# Patient Record
Sex: Female | Born: 1935 | Race: White | Hispanic: No | Marital: Married | Smoking: Never smoker
Health system: Southern US, Community
[De-identification: ages and names within clinical notes are randomized; demographics above are authoritative.]

## PROBLEM LIST (undated history)

## (undated) DIAGNOSIS — E785 Hyperlipidemia, unspecified: Secondary | ICD-10-CM

## (undated) DIAGNOSIS — I739 Peripheral vascular disease, unspecified: Secondary | ICD-10-CM

## (undated) DIAGNOSIS — J302 Other seasonal allergic rhinitis: Secondary | ICD-10-CM

## (undated) DIAGNOSIS — M1990 Unspecified osteoarthritis, unspecified site: Secondary | ICD-10-CM

## (undated) HISTORY — PX: OTHER SURGICAL HISTORY: SHX169

## (undated) HISTORY — DX: Hyperlipidemia, unspecified: E78.5

## (undated) HISTORY — PX: DOPPLER ECHOCARDIOGRAPHY: SHX263

## (undated) HISTORY — PX: BREAST SURGERY: SHX581

---

## 1998-07-15 ENCOUNTER — Ambulatory Visit (HOSPITAL_COMMUNITY): Admission: RE | Admit: 1998-07-15 | Discharge: 1998-07-15 | Payer: Self-pay | Admitting: Internal Medicine

## 2000-06-07 ENCOUNTER — Encounter: Payer: Self-pay | Admitting: Internal Medicine

## 2000-06-07 ENCOUNTER — Ambulatory Visit (HOSPITAL_COMMUNITY): Admission: RE | Admit: 2000-06-07 | Discharge: 2000-06-07 | Payer: Self-pay | Admitting: Internal Medicine

## 2002-05-29 ENCOUNTER — Encounter: Payer: Self-pay | Admitting: Internal Medicine

## 2002-05-29 ENCOUNTER — Ambulatory Visit (HOSPITAL_COMMUNITY): Admission: RE | Admit: 2002-05-29 | Discharge: 2002-05-29 | Payer: Self-pay | Admitting: Internal Medicine

## 2003-08-09 ENCOUNTER — Encounter: Payer: Self-pay | Admitting: Internal Medicine

## 2003-08-19 ENCOUNTER — Encounter: Payer: Self-pay | Admitting: Internal Medicine

## 2003-08-19 ENCOUNTER — Ambulatory Visit (HOSPITAL_COMMUNITY): Admission: RE | Admit: 2003-08-19 | Discharge: 2003-08-19 | Payer: Self-pay | Admitting: Internal Medicine

## 2005-04-29 ENCOUNTER — Ambulatory Visit: Payer: Self-pay | Admitting: Internal Medicine

## 2005-07-28 ENCOUNTER — Ambulatory Visit: Payer: Self-pay | Admitting: Internal Medicine

## 2005-07-29 ENCOUNTER — Ambulatory Visit: Payer: Self-pay | Admitting: Internal Medicine

## 2005-08-04 ENCOUNTER — Ambulatory Visit: Payer: Self-pay | Admitting: Internal Medicine

## 2007-02-21 ENCOUNTER — Ambulatory Visit: Payer: Self-pay | Admitting: Internal Medicine

## 2007-02-21 LAB — CONVERTED CEMR LAB
ALT: 20 units/L (ref 0–40)
AST: 25 units/L (ref 0–37)
Albumin: 3.8 g/dL (ref 3.5–5.2)
Alkaline Phosphatase: 76 units/L (ref 39–117)
BUN: 13 mg/dL (ref 6–23)
Basophils Absolute: 0.1 10*3/uL (ref 0.0–0.1)
Basophils Relative: 1 % (ref 0.0–1.0)
Bilirubin, Direct: 0.1 mg/dL (ref 0.0–0.3)
CO2: 31 meq/L (ref 19–32)
Calcium: 9.3 mg/dL (ref 8.4–10.5)
Chloride: 105 meq/L (ref 96–112)
Cholesterol: 239 mg/dL (ref 0–200)
Creatinine, Ser: 0.7 mg/dL (ref 0.4–1.2)
Direct LDL: 157.7 mg/dL
Eosinophils Absolute: 0.2 10*3/uL (ref 0.0–0.6)
Eosinophils Relative: 3.4 % (ref 0.0–5.0)
GFR calc Af Amer: 106 mL/min
GFR calc non Af Amer: 87 mL/min
Glucose, Bld: 101 mg/dL — ABNORMAL HIGH (ref 70–99)
HCT: 43.4 % (ref 36.0–46.0)
HDL: 56.1 mg/dL (ref >39.0)
Hemoglobin: 14.9 g/dL (ref 12.0–15.0)
Lymphocytes Relative: 26.4 % (ref 12.0–46.0)
MCHC: 34.3 g/dL (ref 30.0–36.0)
MCV: 90.9 fL (ref 78.0–100.0)
Monocytes Absolute: 0.5 10*3/uL (ref 0.2–0.7)
Monocytes Relative: 8.7 % (ref 3.0–11.0)
Neutro Abs: 3.3 10*3/uL (ref 1.4–7.7)
Neutrophils Relative %: 60.5 % (ref 43.0–77.0)
Platelets: 227 10*3/uL (ref 150–400)
Potassium: 4.8 meq/L (ref 3.5–5.1)
RBC: 4.78 M/uL (ref 3.87–5.11)
RDW: 11.6 % (ref 11.5–14.6)
Sodium: 143 meq/L (ref 135–145)
TSH: 1.99 microintl units/mL (ref 0.35–5.50)
Total Bilirubin: 0.7 mg/dL (ref 0.3–1.2)
Total CHOL/HDL Ratio: 4.3
Total Protein: 6.5 g/dL (ref 6.0–8.3)
Triglycerides: 166 mg/dL — ABNORMAL HIGH (ref 0–149)
VLDL: 33 mg/dL (ref 0–40)
WBC: 5.6 10*3/uL (ref 4.5–10.5)

## 2007-03-01 ENCOUNTER — Ambulatory Visit (HOSPITAL_COMMUNITY): Admission: RE | Admit: 2007-03-01 | Discharge: 2007-03-01 | Payer: Self-pay | Admitting: Internal Medicine

## 2007-03-01 ENCOUNTER — Encounter: Payer: Self-pay | Admitting: Internal Medicine

## 2007-08-07 ENCOUNTER — Telehealth: Payer: Self-pay | Admitting: Internal Medicine

## 2007-08-07 ENCOUNTER — Ambulatory Visit: Payer: Self-pay | Admitting: Internal Medicine

## 2007-08-10 ENCOUNTER — Emergency Department (HOSPITAL_COMMUNITY): Admission: EM | Admit: 2007-08-10 | Discharge: 2007-08-10 | Payer: Self-pay | Admitting: Emergency Medicine

## 2008-12-30 ENCOUNTER — Ambulatory Visit: Payer: Self-pay | Admitting: Internal Medicine

## 2008-12-31 ENCOUNTER — Ambulatory Visit: Payer: Self-pay | Admitting: Internal Medicine

## 2009-01-17 ENCOUNTER — Ambulatory Visit: Payer: Self-pay | Admitting: Internal Medicine

## 2009-01-17 DIAGNOSIS — E785 Hyperlipidemia, unspecified: Secondary | ICD-10-CM | POA: Insufficient documentation

## 2009-01-17 LAB — CONVERTED CEMR LAB
Glucose, Urine, Semiquant: NEGATIVE
Nitrite: NEGATIVE
Specific Gravity, Urine: 1.015
WBC Urine, dipstick: NEGATIVE
pH: 7.5

## 2009-01-21 ENCOUNTER — Encounter: Payer: Self-pay | Admitting: Internal Medicine

## 2009-01-21 ENCOUNTER — Ambulatory Visit: Payer: Self-pay | Admitting: Internal Medicine

## 2009-01-21 LAB — CONVERTED CEMR LAB
ALT: 21 units/L (ref 0–35)
AST: 23 units/L (ref 0–37)
Albumin: 4.1 g/dL (ref 3.5–5.2)
Alkaline Phosphatase: 70 units/L (ref 39–117)
BUN: 12 mg/dL (ref 6–23)
Basophils Absolute: 0 10*3/uL (ref 0.0–0.1)
Basophils Relative: 0.8 % (ref 0.0–3.0)
Bilirubin, Direct: 0.1 mg/dL (ref 0.0–0.3)
CO2: 32 meq/L (ref 19–32)
Calcium: 9.1 mg/dL (ref 8.4–10.5)
Chloride: 106 meq/L (ref 96–112)
Cholesterol: 209 mg/dL (ref 0–200)
Creatinine, Ser: 0.6 mg/dL (ref 0.4–1.2)
Direct LDL: 142.9 mg/dL
Eosinophils Absolute: 0.2 10*3/uL (ref 0.0–0.7)
Eosinophils Relative: 3.7 % (ref 0.0–5.0)
GFR calc Af Amer: 126 mL/min
GFR calc non Af Amer: 104 mL/min
Glucose, Bld: 95 mg/dL (ref 70–99)
HCT: 43.4 % (ref 36.0–46.0)
HDL: 55.5 mg/dL (ref >39.0)
Hemoglobin: 15.2 g/dL — ABNORMAL HIGH (ref 12.0–15.0)
Lymphocytes Relative: 27.2 % (ref 12.0–46.0)
MCHC: 35 g/dL (ref 30.0–36.0)
MCV: 90 fL (ref 78.0–100.0)
Monocytes Absolute: 0.5 10*3/uL (ref 0.1–1.0)
Monocytes Relative: 9.1 % (ref 3.0–12.0)
Neutro Abs: 2.9 10*3/uL (ref 1.4–7.7)
Neutrophils Relative %: 59.2 % (ref 43.0–77.0)
Platelets: 195 10*3/uL (ref 150–400)
Potassium: 4.7 meq/L (ref 3.5–5.1)
RBC: 4.83 M/uL (ref 3.87–5.11)
RDW: 12.1 % (ref 11.5–14.6)
Sodium: 143 meq/L (ref 135–145)
TSH: 1.95 microintl units/mL (ref 0.35–5.50)
Total Bilirubin: 0.9 mg/dL (ref 0.3–1.2)
Total CHOL/HDL Ratio: 3.8
Total Protein: 6.7 g/dL (ref 6.0–8.3)
Triglycerides: 67 mg/dL (ref 0–149)
VLDL: 13 mg/dL (ref 0–40)
WBC: 5 10*3/uL (ref 4.5–10.5)

## 2009-01-22 ENCOUNTER — Encounter: Payer: Self-pay | Admitting: Internal Medicine

## 2009-01-22 ENCOUNTER — Encounter (INDEPENDENT_AMBULATORY_CARE_PROVIDER_SITE_OTHER): Payer: Self-pay | Admitting: *Deleted

## 2009-02-17 ENCOUNTER — Ambulatory Visit: Payer: Self-pay | Admitting: Internal Medicine

## 2009-03-10 ENCOUNTER — Ambulatory Visit: Payer: Self-pay | Admitting: Internal Medicine

## 2009-03-10 LAB — HM COLONOSCOPY

## 2009-07-22 ENCOUNTER — Ambulatory Visit: Payer: Self-pay | Admitting: Internal Medicine

## 2009-07-22 LAB — CONVERTED CEMR LAB
Cholesterol: 239 mg/dL — ABNORMAL HIGH (ref 0–200)
Direct LDL: 163.2 mg/dL
HDL: 54.3 mg/dL (ref >39.00)
Total CHOL/HDL Ratio: 4
Triglycerides: 98 mg/dL (ref 0.0–149.0)
VLDL: 19.6 mg/dL (ref 0.0–40.0)

## 2009-11-25 ENCOUNTER — Telehealth: Payer: Self-pay | Admitting: Internal Medicine

## 2009-11-28 ENCOUNTER — Ambulatory Visit: Payer: Self-pay | Admitting: Internal Medicine

## 2009-11-28 DIAGNOSIS — M25559 Pain in unspecified hip: Secondary | ICD-10-CM | POA: Insufficient documentation

## 2009-12-04 ENCOUNTER — Encounter: Admission: RE | Admit: 2009-12-04 | Discharge: 2009-12-15 | Payer: Self-pay | Admitting: Internal Medicine

## 2009-12-22 ENCOUNTER — Encounter
Admission: RE | Admit: 2009-12-22 | Discharge: 2010-01-08 | Payer: Self-pay | Source: Home / Self Care | Admitting: Internal Medicine

## 2009-12-22 ENCOUNTER — Encounter: Payer: Self-pay | Admitting: Internal Medicine

## 2010-01-08 ENCOUNTER — Encounter: Payer: Self-pay | Admitting: Internal Medicine

## 2011-01-21 NOTE — Miscellaneous (Signed)
Summary: Initial Summary for PT Services/[facility]  Initial Summary for PT Services/[facility]   Imported By: Maryln Gottron 01/02/2010 10:29:51  _____________________________________________________________________  External Attachment:    Type:   Image     Comment:   External Document

## 2011-01-21 NOTE — Miscellaneous (Signed)
Summary: Discharge Summary for PT [facility]  Discharge Summary for PT Services/[place]   Imported By: Maryln Gottron 01/23/2010 09:59:21  _____________________________________________________________________  External Attachment:    Type:   Image     Comment:   External Document

## 2011-11-17 ENCOUNTER — Ambulatory Visit (INDEPENDENT_AMBULATORY_CARE_PROVIDER_SITE_OTHER): Payer: Medicare Other | Admitting: Internal Medicine

## 2011-11-17 ENCOUNTER — Encounter: Payer: Self-pay | Admitting: Internal Medicine

## 2011-11-17 VITALS — BP 154/88 | HR 96 | Temp 98.3°F | Ht 66.5 in | Wt 193.0 lb

## 2011-11-17 DIAGNOSIS — Z23 Encounter for immunization: Secondary | ICD-10-CM

## 2011-11-17 DIAGNOSIS — R030 Elevated blood-pressure reading, without diagnosis of hypertension: Secondary | ICD-10-CM

## 2011-11-17 DIAGNOSIS — M25559 Pain in unspecified hip: Secondary | ICD-10-CM

## 2011-11-17 DIAGNOSIS — E785 Hyperlipidemia, unspecified: Secondary | ICD-10-CM

## 2011-11-17 DIAGNOSIS — IMO0001 Reserved for inherently not codable concepts without codable children: Secondary | ICD-10-CM

## 2011-11-17 DIAGNOSIS — Z0000 Encounter for general adult medical examination without abnormal findings: Secondary | ICD-10-CM

## 2011-11-17 LAB — LDL CHOLESTEROL, DIRECT: Direct LDL: 179.3 mg/dL

## 2011-11-17 LAB — CBC WITH DIFFERENTIAL/PLATELET
Basophils Relative: 1.2 % (ref 0.0–3.0)
Eosinophils Absolute: 0.3 10*3/uL (ref 0.0–0.7)
Eosinophils Relative: 5.3 % — ABNORMAL HIGH (ref 0.0–5.0)
HCT: 44.7 % (ref 36.0–46.0)
Hemoglobin: 14.7 g/dL (ref 12.0–15.0)
Lymphs Abs: 1.5 10*3/uL (ref 0.7–4.0)
MCHC: 33 g/dL (ref 30.0–36.0)
MCV: 93.3 fl (ref 78.0–100.0)
Monocytes Absolute: 0.5 10*3/uL (ref 0.1–1.0)
Neutro Abs: 3.5 10*3/uL (ref 1.4–7.7)
Neutrophils Relative %: 59.4 % (ref 43.0–77.0)
RBC: 4.79 Mil/uL (ref 3.87–5.11)

## 2011-11-17 LAB — BASIC METABOLIC PANEL
CO2: 29 mEq/L (ref 19–32)
Calcium: 9.3 mg/dL (ref 8.4–10.5)
Chloride: 107 mEq/L (ref 96–112)
Creatinine, Ser: 0.6 mg/dL (ref 0.4–1.2)
Sodium: 144 mEq/L (ref 135–145)

## 2011-11-17 LAB — HEPATIC FUNCTION PANEL
ALT: 22 U/L (ref 0–35)
Alkaline Phosphatase: 73 U/L (ref 39–117)
Bilirubin, Direct: 0.1 mg/dL (ref 0.0–0.3)
Total Bilirubin: 0.8 mg/dL (ref 0.3–1.2)
Total Protein: 6.9 g/dL (ref 6.0–8.3)

## 2011-11-17 LAB — LIPID PANEL: Total CHOL/HDL Ratio: 4

## 2011-11-17 NOTE — Assessment & Plan Note (Signed)
Reviewed imaging studies---has OA of lift hip.  Refer to PT. May need to see ortho again

## 2011-11-19 NOTE — Progress Notes (Signed)
Quick Note:  Left a message for pt to return call. ______ 

## 2011-11-19 NOTE — Progress Notes (Signed)
Quick Note:  Pt aware ______ 

## 2011-11-21 DIAGNOSIS — M25559 Pain in unspecified hip: Secondary | ICD-10-CM | POA: Insufficient documentation

## 2011-11-21 NOTE — Assessment & Plan Note (Signed)
She brings in  x-rays. It appears to me that she has some arthritis of her left hip. I'll refer her to physical therapy.

## 2011-11-21 NOTE — Progress Notes (Signed)
Subjective:    Mary Summers is a 77 y.o. female who presents for Medicare Annual/Subsequent preventive examination.  Preventive Screening-Counseling & Management  Tobacco History  Smoking status  . Former Smoker  Smokeless tobacco  . Not on file     Problems Prior to Visit 1.   Current Problems (verified) Patient Active Problem List  Diagnoses  . HYPERLIPIDEMIA    Medications Prior to Visit No current outpatient prescriptions on file prior to visit.    Current Medications (verified) Current Outpatient Prescriptions  Medication Sig Dispense Refill  . Calcium Carbonate-Vitamin D (CALCIUM 600+D) 600-400 MG-UNIT per tablet Take 1 tablet by mouth daily.        . fexofenadine (ALLEGRA) 180 MG tablet Take 180 mg by mouth daily.        . fish oil-omega-3 fatty acids 1000 MG capsule Take 2 g by mouth daily.        Marland Kitchen glucosamine-chondroitin 500-400 MG tablet Take 1 tablet by mouth 3 (three) times daily.        . Multiple Vitamin (MULTIVITAMIN) tablet Take 1 tablet by mouth daily.        . vitamin C (ASCORBIC ACID) 500 MG tablet Take 500 mg by mouth daily.           Allergies (verified) Review of patient's allergies indicates no known allergies.   PAST HISTORY  Family History No family history on file.  Social History History  Substance Use Topics  . Smoking status: Former Games developer  . Smokeless tobacco: Not on file  . Alcohol Use:      Are there smokers in your home (other than you)? No  Risk Factors Current exercise habits: The patient does not participate in regular exercise at present.  Dietary issues discussed: she does not follow any particular diet   Cardiac risk factors: advanced age (older than 68 for men, 76 for women).  Depression Screen (Note: if answer to either of the following is "Yes", a more complete depression screening is indicated)   Over the past two weeks, have you felt down, depressed or hopeless? No    Activities of Daily Living In  your present state of health, do you have any difficulty performing the following activities?:  Driving? No Managing money?  No  Hearing Difficulties: No  Do you feel that you have a problem with memory? No   Cognitive Testing  Alert? Yes  Normal Appearance?Yes  Oriented to person? Yes  Place? Yes     Advanced Directives have been discussed with the patient? No  List the Names of Other Physician/Practitioners you currently use: 1.    Indicate any recent Medical Services you may have received from other than [facility] providers in the past year (date may be approximate).  Immunization History  Administered Date(s) Administered  . Influenza Split 11/17/2011  . Pneumococcal Polysaccharide 01/17/2009  . Td 05/20/1997, 01/17/2009    Screening Tests Health Maintenance  Topic Date Due  . Zostavax  06/03/1994  . Influenza Vaccine  09/19/2012  . Tetanus/tdap  01/17/2019  . Colonoscopy  03/11/2019  . Pneumococcal Polysaccharide Vaccine Age 33 And Over  Completed    All answers were reviewed with the patient and necessary referrals were made:  Judie Petit, MD   11/21/2011   History reviewed: allergies, current medications, past family history, past medical history, past social history, past surgical history and problem list  Review of Systems  patient denies chest pain, shortness of breath, orthopnea. Denies lower extremity edema,  abdominal pain, change in appetite, change in bowel movements. Patient denies rashes, musculoskeletal complaints. No other specific complaints in a complete review of systems.    Objective:       Body mass index is 30.68 kg/(m^2). BP 154/88  Pulse 96  Temp(Src) 98.3 F (36.8 C) (Oral)  Ht 5' 6.5" (1.689 m)  Wt 193 lb (87.544 kg)  BMI 30.68 kg/m2   Well-developed well-nourished female in no acute distress. HEENT exam atraumatic, normocephalic, extraocular muscles are intact. Neck is supple. No jugular venous distention no thyromegaly. Chest clear  to auscultation without increased work of breathing. Cardiac exam S1 and S2 are regular. Abdominal exam active bowel sounds, soft, nontender. Extremities no edema. Neurologic exam she is alert without any motor sensory deficits. Gait is normal.      Assessment:  Well visit.   Health maintenance up-to-date.     Plan:  Discussed need for weight loss regular exercise. She does have trouble with her hip.   During the course of the visit the patient was educated and counseled about appropriate screening and preventive services including:    Pneumococcal vaccine   Influenza vaccine  Td vaccine  Diet review for nutrition referral? Yes ____  Not Indicated ____x   Patient Instructions (the written plan) was given to the patient.  Medicare Attestation I have personally reviewed: The patient's medical and social history Their use of alcohol, tobacco or illicit drugs Their current medications and supplements The patient's functional ability including ADLs,fall risks, home safety risks, cognitive, and hearing and visual impairment Diet and physical activities Evidence for depression or mood disorders  The patient's weight, height, BMI, and visual acuity have been recorded in the chart.  I have made referrals, counseling, and provided education to the patient based on review of the above and I have provided the patient with a written personalized care plan for preventive services.     Judie Petit, MD   11/21/2011

## 2011-11-24 ENCOUNTER — Telehealth: Payer: Self-pay

## 2011-11-24 NOTE — Telephone Encounter (Signed)
Pt would like to have a mammogram at [facility].  Pls advise.

## 2011-11-25 ENCOUNTER — Ambulatory Visit: Payer: Medicare Other | Attending: Internal Medicine

## 2011-11-25 DIAGNOSIS — IMO0001 Reserved for inherently not codable concepts without codable children: Secondary | ICD-10-CM | POA: Insufficient documentation

## 2011-11-25 DIAGNOSIS — M25559 Pain in unspecified hip: Secondary | ICD-10-CM | POA: Insufficient documentation

## 2011-11-25 DIAGNOSIS — M25569 Pain in unspecified knee: Secondary | ICD-10-CM | POA: Insufficient documentation

## 2011-11-25 DIAGNOSIS — R5381 Other malaise: Secondary | ICD-10-CM | POA: Insufficient documentation

## 2011-11-26 NOTE — Telephone Encounter (Signed)
Pt is having sneezing, stuffy nose, congestion x2 wks, allegra and nasal spray not helping.  [facility]

## 2011-11-26 NOTE — Telephone Encounter (Signed)
Pt aware, pt was also told that she did not need a referral for mammogram so she will call and make her appt

## 2011-11-26 NOTE — Telephone Encounter (Signed)
mucinex dm bid for 7 days 

## 2011-11-30 ENCOUNTER — Ambulatory Visit: Payer: Medicare Other

## 2011-12-03 ENCOUNTER — Ambulatory Visit: Payer: Medicare Other

## 2011-12-07 ENCOUNTER — Ambulatory Visit: Payer: Medicare Other

## 2011-12-10 ENCOUNTER — Ambulatory Visit: Payer: Medicare Other

## 2011-12-15 ENCOUNTER — Ambulatory Visit: Payer: Medicare Other | Admitting: Physical Therapy

## 2011-12-17 ENCOUNTER — Ambulatory Visit: Payer: Medicare Other

## 2011-12-22 ENCOUNTER — Ambulatory Visit: Payer: Medicare Other | Attending: Internal Medicine | Admitting: Physical Therapy

## 2011-12-22 DIAGNOSIS — IMO0001 Reserved for inherently not codable concepts without codable children: Secondary | ICD-10-CM | POA: Diagnosis not present

## 2011-12-22 DIAGNOSIS — R5381 Other malaise: Secondary | ICD-10-CM | POA: Insufficient documentation

## 2011-12-22 DIAGNOSIS — M25559 Pain in unspecified hip: Secondary | ICD-10-CM | POA: Diagnosis not present

## 2011-12-22 DIAGNOSIS — M25569 Pain in unspecified knee: Secondary | ICD-10-CM | POA: Diagnosis not present

## 2011-12-24 ENCOUNTER — Ambulatory Visit: Payer: Medicare Other

## 2012-01-07 ENCOUNTER — Other Ambulatory Visit: Payer: Self-pay | Admitting: Dermatology

## 2012-01-07 DIAGNOSIS — C44721 Squamous cell carcinoma of skin of unspecified lower limb, including hip: Secondary | ICD-10-CM | POA: Diagnosis not present

## 2012-01-07 DIAGNOSIS — L219 Seborrheic dermatitis, unspecified: Secondary | ICD-10-CM | POA: Diagnosis not present

## 2012-01-07 DIAGNOSIS — L820 Inflamed seborrheic keratosis: Secondary | ICD-10-CM | POA: Diagnosis not present

## 2012-01-07 DIAGNOSIS — D0470 Carcinoma in situ of skin of unspecified lower limb, including hip: Secondary | ICD-10-CM | POA: Diagnosis not present

## 2012-03-21 DIAGNOSIS — L219 Seborrheic dermatitis, unspecified: Secondary | ICD-10-CM | POA: Diagnosis not present

## 2012-03-21 DIAGNOSIS — Z85828 Personal history of other malignant neoplasm of skin: Secondary | ICD-10-CM | POA: Diagnosis not present

## 2012-05-03 ENCOUNTER — Telehealth: Payer: Self-pay | Admitting: *Deleted

## 2012-05-03 NOTE — Telephone Encounter (Signed)
Patient is calling returning a call from [place] inquiring about a letter

## 2012-05-04 ENCOUNTER — Telehealth: Payer: Self-pay | Admitting: *Deleted

## 2012-05-04 NOTE — Telephone Encounter (Signed)
Pt hung up the phone after calling us back.

## 2012-05-09 NOTE — Telephone Encounter (Signed)
Pt mailed letter wanting a CT scan of her lungs for a cough and Dr Cato Mulligan it was not necessary.  Pt also wanted a referral to Dr Elnita Maxwell and Dr Cato Mulligan she could make the appt herself.  She was wanting an ECHO because she has had near black outs and Dr Cato Mulligan said it was not necessary.  Per Dr Cato Mulligan letter can be discarded, no need to scan in chart pt would like the letter mailed back to her.  Letter mailed

## 2012-05-19 DIAGNOSIS — I6529 Occlusion and stenosis of unspecified carotid artery: Secondary | ICD-10-CM | POA: Diagnosis not present

## 2012-05-19 DIAGNOSIS — E782 Mixed hyperlipidemia: Secondary | ICD-10-CM | POA: Diagnosis not present

## 2012-06-07 DIAGNOSIS — H9319 Tinnitus, unspecified ear: Secondary | ICD-10-CM | POA: Diagnosis not present

## 2012-06-07 DIAGNOSIS — M279 Disease of jaws, unspecified: Secondary | ICD-10-CM | POA: Diagnosis not present

## 2012-06-07 DIAGNOSIS — H9110 Presbycusis, unspecified ear: Secondary | ICD-10-CM | POA: Diagnosis not present

## 2012-08-18 DIAGNOSIS — Z79899 Other long term (current) drug therapy: Secondary | ICD-10-CM | POA: Diagnosis not present

## 2012-08-18 DIAGNOSIS — E782 Mixed hyperlipidemia: Secondary | ICD-10-CM | POA: Diagnosis not present

## 2012-08-31 DIAGNOSIS — M169 Osteoarthritis of hip, unspecified: Secondary | ICD-10-CM | POA: Diagnosis not present

## 2012-09-01 ENCOUNTER — Ambulatory Visit
Admission: RE | Admit: 2012-09-01 | Discharge: 2012-09-01 | Disposition: A | Discharge status: Home / Self Care | Payer: Medicare Other | Source: Ambulatory Visit | Attending: Orthopedic Surgery | Admitting: Orthopedic Surgery

## 2012-09-01 ENCOUNTER — Other Ambulatory Visit: Payer: Self-pay | Admitting: Orthopedic Surgery

## 2012-09-01 DIAGNOSIS — M25559 Pain in unspecified hip: Secondary | ICD-10-CM | POA: Diagnosis not present

## 2012-09-01 DIAGNOSIS — M169 Osteoarthritis of hip, unspecified: Secondary | ICD-10-CM

## 2012-09-01 MED ORDER — METHYLPREDNISOLONE ACETATE 40 MG/ML INJ SUSP (RADIOLOG
120.0000 mg | Freq: Once | INTRAMUSCULAR | Status: AC
  Administered 2012-09-01: 120 mg via INTRA_ARTICULAR

## 2012-09-01 MED ORDER — IOHEXOL 180 MG/ML  SOLN
1.0000 mL | Freq: Once | INTRAMUSCULAR | Status: AC | PRN
  Administered 2012-09-01: 1 mL via INTRA_ARTICULAR

## 2012-10-12 ENCOUNTER — Other Ambulatory Visit: Payer: Self-pay | Admitting: Orthopedic Surgery

## 2012-10-12 MED ORDER — BUPIVACAINE LIPOSOME 1.3 % IJ SUSP: 20.0000 mL | Freq: Once | INTRAMUSCULAR | Status: DC

## 2012-10-12 MED ORDER — DEXAMETHASONE SODIUM PHOSPHATE 10 MG/ML IJ SOLN: 10.0000 mg | Freq: Once | INTRAMUSCULAR | Status: DC

## 2012-10-12 NOTE — Progress Notes (Signed)
Preoperative surgical orders have been place into the Epic hospital system for Mary Summers on 10/12/2012, 2:49 PM  by Patrica Duel for surgery on 11/29/12.  Preop Total Hip orders including Experel Injecion, IV Tylenol, and IV Decadron as long as there are no contraindications to the above medications. Avel Peace, PA-C

## 2012-11-02 ENCOUNTER — Telehealth: Payer: Self-pay | Admitting: Internal Medicine

## 2012-11-02 ENCOUNTER — Other Ambulatory Visit (HOSPITAL_COMMUNITY): Payer: Self-pay | Admitting: Internal Medicine

## 2012-11-02 DIAGNOSIS — I6529 Occlusion and stenosis of unspecified carotid artery: Secondary | ICD-10-CM

## 2012-11-02 NOTE — Telephone Encounter (Signed)
Pt has CPX scheduled 12/2 at 9, pt aware

## 2012-11-02 NOTE — Telephone Encounter (Signed)
Pt states Arline Asp called to make her an appt to be seen by Dr Cato Mulligan (approx 4 wks ago), before her hip surgery on Dec 11. But [place] has been [place] of the [place] and just returned. Can we schedule her or have her see another provider?

## 2012-11-03 DIAGNOSIS — M169 Osteoarthritis of hip, unspecified: Secondary | ICD-10-CM | POA: Diagnosis not present

## 2012-11-13 ENCOUNTER — Ambulatory Visit (HOSPITAL_COMMUNITY)
Admission: RE | Admit: 2012-11-13 | Discharge: 2012-11-13 | Disposition: A | Discharge status: Home / Self Care | Payer: Medicare Other | Source: Ambulatory Visit | Attending: Internal Medicine | Admitting: Internal Medicine

## 2012-11-13 DIAGNOSIS — I6529 Occlusion and stenosis of unspecified carotid artery: Secondary | ICD-10-CM

## 2012-11-13 NOTE — Progress Notes (Signed)
Carotid Doppler completed. Mary Summers D  

## 2012-11-20 ENCOUNTER — Encounter: Payer: Self-pay | Admitting: Internal Medicine

## 2012-11-20 ENCOUNTER — Ambulatory Visit (INDEPENDENT_AMBULATORY_CARE_PROVIDER_SITE_OTHER): Payer: BC Managed Care – PPO | Admitting: Internal Medicine

## 2012-11-20 VITALS — BP 146/88 | HR 89 | Temp 98.7°F | Ht 67.0 in | Wt 180.0 lb

## 2012-11-20 DIAGNOSIS — Z0000 Encounter for general adult medical examination without abnormal findings: Secondary | ICD-10-CM | POA: Diagnosis not present

## 2012-11-20 DIAGNOSIS — M1610 Unilateral primary osteoarthritis, unspecified hip: Secondary | ICD-10-CM | POA: Diagnosis not present

## 2012-11-20 DIAGNOSIS — Z23 Encounter for immunization: Secondary | ICD-10-CM

## 2012-11-20 DIAGNOSIS — I10 Essential (primary) hypertension: Secondary | ICD-10-CM

## 2012-11-20 DIAGNOSIS — M169 Osteoarthritis of hip, unspecified: Secondary | ICD-10-CM | POA: Diagnosis not present

## 2012-11-20 DIAGNOSIS — E785 Hyperlipidemia, unspecified: Secondary | ICD-10-CM | POA: Diagnosis not present

## 2012-11-20 DIAGNOSIS — Z01810 Encounter for preprocedural cardiovascular examination: Secondary | ICD-10-CM

## 2012-11-20 LAB — HEPATIC FUNCTION PANEL
ALT: 25 U/L (ref 0–35)
Alkaline Phosphatase: 67 U/L (ref 39–117)
Bilirubin, Direct: 0.1 mg/dL (ref 0.0–0.3)
Total Bilirubin: 0.7 mg/dL (ref 0.3–1.2)
Total Protein: 6.7 g/dL (ref 6.0–8.3)

## 2012-11-20 LAB — BASIC METABOLIC PANEL
BUN: 12 mg/dL (ref 6–23)
Creatinine, Ser: 0.6 mg/dL (ref 0.4–1.2)
GFR: 111.14 mL/min (ref >60.00)
Glucose, Bld: 102 mg/dL — ABNORMAL HIGH (ref 70–99)

## 2012-11-20 LAB — LIPID PANEL
Cholesterol: 148 mg/dL (ref 0–200)
LDL Cholesterol: 78 mg/dL (ref 0–99)

## 2012-11-20 NOTE — Progress Notes (Deleted)
  Subjective:    Patient ID: Mary Summers, female    DOB: 1932-11-05, 78 y.o.   MRN: [id number]  HPI    Review of Systems     Objective:   Physical Exam        Assessment & Plan:

## 2012-11-20 NOTE — Progress Notes (Signed)
Patient ID: Mary Summers, female   DOB: Apr 25, 1932, 78 y.o.   MRN: [id number] cpx  Patient scheduled for THA- Aluisio on 11/29/12 She feels well, has no complaints.  She tells me that her bother-in-law looked at hip films and commented that her back was "bad". She has no back pain and her only problem is left hip pain with walking.Marland Kitchen  No past medical history on file.  History   Social History  . Marital Status: Married    Spouse Name: N/A    Number of Children: N/A  . Years of Education: N/A   Occupational History  . Not on file.   Social History Main Topics  . Smoking status: Former Games developer  . Smokeless tobacco: Not on file  . Alcohol Use:   . Drug Use:   . Sexually Active:    Other Topics Concern  . Not on file   Social History Narrative  . No narrative on file    Past Surgical History  Procedure Date  . Congenitally absent uterus     No family history on file.  No Known Allergies  Current Outpatient Prescriptions on File Prior to Visit  Medication Sig Dispense Refill  . atorvastatin (LIPITOR) 40 MG tablet Take 40 mg by mouth daily.      . Calcium Carbonate-Vitamin D (CALCIUM 600+D) 600-400 MG-UNIT per tablet Take 1 tablet by mouth daily.        . fexofenadine (ALLEGRA) 180 MG tablet Take 180 mg by mouth daily.        . fish oil-omega-3 fatty acids 1000 MG capsule Take 2 g by mouth daily.        Marland Kitchen glucosamine-chondroitin 500-400 MG tablet Take 1 tablet by mouth 3 (three) times daily.        . Multiple Vitamin (MULTIVITAMIN) tablet Take 1 tablet by mouth daily.        . vitamin C (ASCORBIC ACID) 500 MG tablet Take 500 mg by mouth daily.           patient denies chest pain, shortness of breath, orthopnea. Denies lower extremity edema, abdominal pain, change in appetite, change in bowel movements. Patient denies rashes, musculoskeletal complaints. No other specific complaints in a complete review of systems.   BP 146/88  Pulse 89  Temp 98.7 F (37.1 C)  (Oral)  Ht 5\' 7"  (1.702 m)  Wt 180 lb (81.647 kg)  BMI 28.19 kg/m2  Well-developed well-nourished female in no acute distress. HEENT exam atraumatic, normocephalic, extraocular muscles are intact. Neck is supple. No jugular venous distention no thyromegaly. Chest clear to auscultation without increased work of breathing. Cardiac exam S1 and S2 are regular. Abdominal exam active bowel sounds, soft, nontender. Extremities no edema. Neurologic exam she is alert without any motor sensory deficits. Gait is normal.   Well Visit- health maint UTD She is ok for surgery-- reviewed EKG (normal)

## 2012-11-21 ENCOUNTER — Encounter (HOSPITAL_COMMUNITY): Payer: Self-pay | Admitting: Pharmacy Technician

## 2012-11-23 ENCOUNTER — Ambulatory Visit (HOSPITAL_COMMUNITY)
Admission: RE | Admit: 2012-11-23 | Discharge: 2012-11-23 | Disposition: A | Discharge status: Home / Self Care | Payer: Medicare Other | Source: Ambulatory Visit | Attending: Orthopedic Surgery | Admitting: Orthopedic Surgery

## 2012-11-23 ENCOUNTER — Encounter (HOSPITAL_COMMUNITY)
Admission: RE | Admit: 2012-11-23 | Discharge: 2012-11-23 | Disposition: A | Discharge status: Home / Self Care | Payer: Medicare Other | Source: Ambulatory Visit | Attending: Orthopedic Surgery | Admitting: Orthopedic Surgery

## 2012-11-23 ENCOUNTER — Encounter (HOSPITAL_COMMUNITY): Payer: Self-pay

## 2012-11-23 DIAGNOSIS — J984 Other disorders of lung: Secondary | ICD-10-CM | POA: Insufficient documentation

## 2012-11-23 DIAGNOSIS — J449 Chronic obstructive pulmonary disease, unspecified: Secondary | ICD-10-CM | POA: Insufficient documentation

## 2012-11-23 DIAGNOSIS — Z01818 Encounter for other preprocedural examination: Secondary | ICD-10-CM | POA: Insufficient documentation

## 2012-11-23 DIAGNOSIS — Z01812 Encounter for preprocedural laboratory examination: Secondary | ICD-10-CM | POA: Diagnosis not present

## 2012-11-23 DIAGNOSIS — M169 Osteoarthritis of hip, unspecified: Secondary | ICD-10-CM | POA: Diagnosis not present

## 2012-11-23 DIAGNOSIS — J4489 Other specified chronic obstructive pulmonary disease: Secondary | ICD-10-CM | POA: Insufficient documentation

## 2012-11-23 HISTORY — DX: Other seasonal allergic rhinitis: J30.2

## 2012-11-23 HISTORY — DX: Peripheral vascular disease, unspecified: I73.9

## 2012-11-23 HISTORY — DX: Unspecified osteoarthritis, unspecified site: M19.90

## 2012-11-23 LAB — SURGICAL PCR SCREEN: MRSA, PCR: NEGATIVE

## 2012-11-23 LAB — URINALYSIS, ROUTINE W REFLEX MICROSCOPIC
Nitrite: NEGATIVE
Specific Gravity, Urine: 1.01 (ref 1.005–1.030)
Urobilinogen, UA: 0.2 mg/dL (ref 0.0–1.0)

## 2012-11-23 LAB — CBC
Hemoglobin: 14.3 g/dL (ref 12.0–15.0)
MCH: 29.5 pg (ref 26.0–34.0)
MCV: 88.5 fL (ref 78.0–100.0)
Platelets: 235 10*3/uL (ref 150–400)
RBC: 4.85 MIL/uL (ref 3.87–5.11)

## 2012-11-23 LAB — ABO/RH: ABO/RH(D): O NEG

## 2012-11-23 NOTE — Pre-Procedure Instructions (Signed)
PREOP CBC, CMET, PT, PTT, UA, T/S, EKG, CXR AND HIP XRAY WERE DONE TODAY AT [facility] AS PER ORDERS DR. Lequita Halt AND ANESTHESIOLOGIST'S GUIDELINES.

## 2012-11-23 NOTE — Patient Instructions (Signed)
YOUR SURGERY IS SCHEDULED AT [facility]  ON:  WED 12/11  REPORT TO [place] SHORT STAY [facility]ENTER AT:  6:30 AM      PHONE # FOR SHORT STAY IS [phone]  DO NOT EAT OR DRINK ANYTHING AFTER MIDNIGHT THE NIGHT BEFORE YOUR SURGERY.  YOU MAY BRUSH YOUR TEETH, RINSE OUT YOUR MOUTH--BUT NO WATER, NO FOOD, NO CHEWING GUM, NO MINTS, NO CANDIES, NO CHEWING TOBACCO.  PLEASE TAKE THE FOLLOWING MEDICATIONS THE AM OF YOUR SURGERY WITH A FEW SIPS OF WATER:  LIPITOR  IF YOU USE INHALERS--USE YOUR INHALERS THE AM OF YOUR SURGERY AND BRING INHALERS TO THE HOSPITAL -TAKE TO SURGERY.    IF YOU ARE DIABETIC:  DO NOT TAKE ANY DIABETIC MEDICATIONS THE AM OF YOUR SURGERY.  IF YOU TAKE INSULIN IN THE EVENINGS--PLEASE ONLY TAKE 1/2 NORMAL EVENING DOSE THE NIGHT BEFORE YOUR SURGERY.  NO INSULIN THE AM OF YOUR SURGERY.  IF YOU HAVE SLEEP APNEA AND USE CPAP OR BIPAP--PLEASE BRING THE MASK AND THE TUBING.  DO NOT BRING YOUR MACHINE.  DO NOT BRING VALUABLES, MONEY, CREDIT CARDS.  DO NOT WEAR JEWELRY, MAKE-UP, NAIL POLISH AND NO METAL PINS OR CLIPS IN YOUR HAIR. CONTACT LENS, DENTURES / PARTIALS, GLASSES SHOULD NOT BE WORN TO SURGERY AND IN MOST CASES-HEARING AIDS WILL NEED TO BE REMOVED.  BRING YOUR GLASSES CASE, ANY EQUIPMENT NEEDED FOR YOUR CONTACT LENS. FOR PATIENTS ADMITTED TO THE HOSPITAL--CHECK OUT TIME THE DAY OF DISCHARGE IS 11:00 AM.  ALL INPATIENT ROOMS ARE PRIVATE - WITH BATHROOM, TELEPHONE, TELEVISION AND WIFI INTERNET.  IF YOU ARE BEING DISCHARGED THE SAME DAY OF YOUR SURGERY--YOU CAN NOT DRIVE YOURSELF HOME--AND SHOULD NOT GO HOME ALONE BY TAXI OR BUS.  NO DRIVING OR OPERATING MACHINERY FOR 24 HOURS FOLLOWING ANESTHESIA / PAIN MEDICATIONS.  PLEASE MAKE ARRANGEMENTS FOR SOMEONE TO BE WITH YOU AT HOME THE FIRST 24 HOURS AFTER SURGERY. RESPONSIBLE DRIVER'S NAME___________________________                                               PHONE #   _______________________                                PLEASE READ OVER ANY  FACT SHEETS THAT YOU WERE GIVEN: MRSA INFORMATION, BLOOD TRANSFUSION INFORMATION, INCENTIVE SPIROMETER INFORMATION. FAILURE TO FOLLOW THESE INSTRUCTIONS MAY RESULT IN THE CANCELLATION OF YOUR SURGERY.   PATIENT SIGNATURE_________________________________

## 2012-11-24 ENCOUNTER — Encounter (HOSPITAL_COMMUNITY): Payer: Self-pay

## 2012-11-24 NOTE — Pre-Procedure Instructions (Signed)
PT BROUGHT HER ENVELOPE WITH HER H&P FROM DR. Crecencio Mc PLACED ON HER CHART. PT'S OFFICE NOTES FROM [facility] 05[facility]19/12 TO SEE DR. HILTY OBTAINED AND ON PT'S CHART.

## 2012-11-28 ENCOUNTER — Other Ambulatory Visit: Payer: Self-pay | Admitting: Orthopedic Surgery

## 2012-11-28 NOTE — H&P (Signed)
Mary Summers  DOB: Dec 05, 1932 Married / Language: English / Race: White Female  Date of Admission:  11/29/2012  Chief complaint:  Left Hip Pain  History of Present Illness The patient is scheduled for a left total hip arthroplasty to be performed by Dr. Gus Rankin. Aluisio, MD at [facility] on 11/29/12. The patient is a 78 year old female who presents with a hip problem. The patient was seen for a second opinion.The patient reports left hip problems including pain and snapping symptoms that have been present for year(s). The symptoms began without any known injury. The patient reports symptoms radiating to the: left groin, left thigh posteriorly and left knee.The patient feels as if their symptoms are does feel they are worsening. Prior to being seen today the patient was previously evaluated by a colleague 2 year(s) ago. Previous workup for this problem has included hip x-rays. Previous treatment for this problem has included physical therapy (Her office visit from Dr. Eulah Pont states she had taken some voltaren, but she states she has never taken any NSAIDs for her hip problem). She is now in pain at all times. The hip is limiting what she can and can not do. She has pain in her groin an anterior thigh. It is not radiating all the way to her knee. She is not having any right hip pain at this time. There is a strong family history of arthritis and needing hip replacements. She feels as though the hip is preventing her from doing things she desires. The pain is even keeping her awake at night. She is ready to get this fixed. They have been treated conservatively in the past for the above stated problem and despite conservative measures, they continue to have progressive pain and severe functional limitations and dysfunction. They have failed non-operative management including home exercise, medications, and injections. It is felt that they would benefit from undergoing total joint replacement.  Risks and benefits of the procedure have been discussed with the patient and they elect to proceed with surgery. There are no active contraindications to surgery such as ongoing infection or rapidly progressive neurological disease.   Problem List Osteoarthritis, Hip (715.35)   Allergies No Known Drug Allergies   Family History Congestive Heart Failure. sister Heart Disease. sister Cerebrovascular Accident. sister Cancer. sister Osteoarthritis. mother   Social History Drug/Alcohol Rehab (Previously). no Exercise. Exercises daily; does other Drug/Alcohol Rehab (Currently). no Children. 0 Current work status. retired Illicit drug use. no Tobacco use. never smoker Pain Contract. no Living situation. live with spouse Marital status. married Alcohol use. current drinker; drinks beer, wine and hard liquor   Medication History Lipitor ( Oral) Specific dose unknown - Active. Aspirin Childrens (81MG  Tablet Chewable, Oral) Active.   Past Surgical History Breast Biopsy. bilateral  Medical History Osteoarthritis Hypercholesterolemia Diverticulitis Of Colon   Review of Systems General:Not Present- Chills, Fever, Night Sweats, Fatigue, Weight Gain, Weight Loss and Memory Loss. Skin:Not Present- Hives, Itching, Rash, Eczema and Lesions. HEENT:Not Present- Tinnitus, Headache, Double Vision, Visual Loss, Hearing Loss and Dentures. Respiratory:Not Present- Shortness of breath with exertion, Shortness of breath at rest, Allergies, Coughing up blood and Chronic Cough. Cardiovascular:Not Present- Chest Pain, Racing/skipping heartbeats, Difficulty Breathing Lying Down, Murmur, Swelling and Palpitations. Gastrointestinal:Not Present- Bloody Stool, Heartburn, Abdominal Pain, Vomiting, Nausea, Constipation, Diarrhea, Difficulty Swallowing, Jaundice and Loss of appetitie. Female Genitourinary:Not Present- Blood in Urine, Urinary frequency, Weak urinary  stream, Discharge, Flank Pain, Incontinence, Painful Urination, Urgency, Urinary Retention and Urinating at Night.  Musculoskeletal:Present- Joint Pain. Not Present- Muscle Weakness, Muscle Pain, Joint Swelling, Back Pain, Morning Stiffness and Spasms. Neurological:Not Present- Tremor, Dizziness, Blackout spells, Paralysis, Difficulty with balance and Weakness. Psychiatric:Not Present- Insomnia.   Vitals Weight: 175 lb Height: 67 in Weight was reported by patient. Height was reported by patient. Body Surface Area: 1.94 m Body Mass Index: 27.41 kg/m Pulse: 84 (Regular) Resp.: 12 (Unlabored) BP: 138/78 (Sitting, Right Arm, Standard)    Physical Exam The physical exam findings are as follows:   General Mental Status - Alert, cooperative and good historian. General Appearance- pleasant. Not in acute distress. Orientation- Oriented X3. Build & Nutrition- Well nourished and Well developed.   Head and Neck Head- normocephalic, atraumatic . Neck Global Assessment- supple. no bruit auscultated on the right and no bruit auscultated on the left.   Eye Pupil- Bilateral- Regular and Round. Motion- Bilateral- EOMI.   Chest and Lung Exam Auscultation: Breath sounds:- clear at anterior chest wall and - clear at posterior chest wall. Adventitious sounds:- No Adventitious sounds.   Cardiovascular Auscultation:Rhythm- Regular rate and rhythm. Heart Sounds- S1 WNL and S2 WNL. Murmurs & Other Heart Sounds:Auscultation of the heart reveals - No Murmurs.   Abdomen Palpation/Percussion:Tenderness- Abdomen is non-tender to palpation. Rigidity (guarding)- Abdomen is soft. Auscultation:Auscultation of the abdomen reveals - Bowel sounds normal.   Female Genitourinary Not done, not pertinent to present illness  Musculoskeletal She is a very pleasant, well developed female in no apparent distress. Evaluation of her right hip shows a normal  range of motion with no discomfort. The left hip flexion is to 100. There is about 10 degrees of external rotation. She does not have any trochanteric tenderness. The knee exam is normal. Pulses, sensation and motor are intact.  RADIOGRAPHS: AP of the pelvis and lateral of the left hip show end stage arthritis, bone on bone. There is a large osteophyte formation inferiorly.  Assessment & Plan Osteoarthritis, Hip (715.35) Impression: Left Hip  Note: Patient is for a left total hip repalcement - anterior approach.  Plan is to go home.  PCP - Dr. Marjorie Smolder, PA-C

## 2012-11-29 ENCOUNTER — Encounter (HOSPITAL_COMMUNITY): Payer: Self-pay | Admitting: Anesthesiology

## 2012-11-29 ENCOUNTER — Encounter (HOSPITAL_COMMUNITY): Payer: Self-pay | Admitting: Orthopedic Surgery

## 2012-11-29 ENCOUNTER — Inpatient Hospital Stay (HOSPITAL_COMMUNITY): Payer: Medicare Other | Admitting: Anesthesiology

## 2012-11-29 ENCOUNTER — Encounter (HOSPITAL_COMMUNITY): Payer: Self-pay | Admitting: *Deleted

## 2012-11-29 ENCOUNTER — Inpatient Hospital Stay (HOSPITAL_COMMUNITY): Payer: Medicare Other

## 2012-11-29 ENCOUNTER — Inpatient Hospital Stay (HOSPITAL_COMMUNITY)
Admission: RE | Admit: 2012-11-29 | Discharge: 2012-12-02 | DRG: 470 | Disposition: A | Discharge status: Home Health | Payer: Medicare Other | Source: Ambulatory Visit | Attending: Orthopedic Surgery | Admitting: Orthopedic Surgery

## 2012-11-29 ENCOUNTER — Encounter (HOSPITAL_COMMUNITY)
Admission: RE | Disposition: A | Discharge status: Home Health | Payer: Self-pay | Source: Ambulatory Visit | Attending: Orthopedic Surgery

## 2012-11-29 DIAGNOSIS — M1610 Unilateral primary osteoarthritis, unspecified hip: Secondary | ICD-10-CM | POA: Diagnosis not present

## 2012-11-29 DIAGNOSIS — J449 Chronic obstructive pulmonary disease, unspecified: Secondary | ICD-10-CM | POA: Diagnosis present

## 2012-11-29 DIAGNOSIS — R000 Tachycardia, unspecified: Secondary | ICD-10-CM | POA: Diagnosis present

## 2012-11-29 DIAGNOSIS — I479 Paroxysmal tachycardia, unspecified: Secondary | ICD-10-CM | POA: Diagnosis not present

## 2012-11-29 DIAGNOSIS — J984 Other disorders of lung: Secondary | ICD-10-CM | POA: Diagnosis not present

## 2012-11-29 DIAGNOSIS — D62 Acute posthemorrhagic anemia: Secondary | ICD-10-CM | POA: Diagnosis not present

## 2012-11-29 DIAGNOSIS — E871 Hypo-osmolality and hyponatremia: Secondary | ICD-10-CM

## 2012-11-29 DIAGNOSIS — M25559 Pain in unspecified hip: Secondary | ICD-10-CM | POA: Diagnosis not present

## 2012-11-29 DIAGNOSIS — Z96649 Presence of unspecified artificial hip joint: Secondary | ICD-10-CM | POA: Diagnosis not present

## 2012-11-29 DIAGNOSIS — J4489 Other specified chronic obstructive pulmonary disease: Secondary | ICD-10-CM | POA: Diagnosis present

## 2012-11-29 DIAGNOSIS — M25859 Other specified joint disorders, unspecified hip: Secondary | ICD-10-CM | POA: Diagnosis not present

## 2012-11-29 DIAGNOSIS — M169 Osteoarthritis of hip, unspecified: Secondary | ICD-10-CM | POA: Diagnosis present

## 2012-11-29 DIAGNOSIS — I739 Peripheral vascular disease, unspecified: Secondary | ICD-10-CM | POA: Diagnosis present

## 2012-11-29 DIAGNOSIS — Z471 Aftercare following joint replacement surgery: Secondary | ICD-10-CM | POA: Diagnosis not present

## 2012-11-29 HISTORY — PX: TOTAL HIP ARTHROPLASTY: SHX124

## 2012-11-29 LAB — TYPE AND SCREEN: Antibody Screen: NEGATIVE

## 2012-11-29 SURGERY — ARTHROPLASTY, HIP, TOTAL, ANTERIOR APPROACH
Anesthesia: Spinal | Site: Hip | Laterality: Left | Wound class: Clean

## 2012-11-29 MED ORDER — ACETAMINOPHEN 10 MG/ML IV SOLN
1000.0000 mg | Freq: Four times a day (QID) | INTRAVENOUS | Status: AC
  Administered 2012-11-29 – 2012-11-30 (×4): 1000 mg via INTRAVENOUS
  Filled 2012-11-29 (×6): qty 100

## 2012-11-29 MED ORDER — BUPIVACAINE LIPOSOME 1.3 % IJ SUSP
INTRAMUSCULAR | Status: DC | PRN
  Administered 2012-11-29: 20 mL

## 2012-11-29 MED ORDER — ACETAMINOPHEN 325 MG PO TABS
650.0000 mg | ORAL_TABLET | Freq: Four times a day (QID) | ORAL | Status: DC | PRN
  Administered 2012-11-30 – 2012-12-02 (×4): 650 mg via ORAL
  Filled 2012-11-29 (×4): qty 2

## 2012-11-29 MED ORDER — METHOCARBAMOL 500 MG PO TABS
500.0000 mg | ORAL_TABLET | Freq: Four times a day (QID) | ORAL | Status: DC | PRN
  Administered 2012-11-29 – 2012-12-02 (×8): 500 mg via ORAL
  Filled 2012-11-29 (×8): qty 1

## 2012-11-29 MED ORDER — ACETAMINOPHEN 10 MG/ML IV SOLN
INTRAVENOUS | Status: DC | PRN
  Administered 2012-11-29: 1000 mg via INTRAVENOUS

## 2012-11-29 MED ORDER — FLEET ENEMA 7-19 GM/118ML RE ENEM: 1.0000 | ENEMA | Freq: Once | RECTAL | Status: AC | PRN

## 2012-11-29 MED ORDER — PHENOL 1.4 % MT LIQD: 1.0000 | OROMUCOSAL | Status: DC | PRN

## 2012-11-29 MED ORDER — ACETAMINOPHEN 650 MG RE SUPP: 650.0000 mg | Freq: Four times a day (QID) | RECTAL | Status: DC | PRN

## 2012-11-29 MED ORDER — TRAMADOL HCL 50 MG PO TABS: 50.0000 mg | ORAL_TABLET | Freq: Four times a day (QID) | ORAL | Status: DC | PRN

## 2012-11-29 MED ORDER — OXYCODONE HCL 5 MG/5ML PO SOLN
5.0000 mg | Freq: Once | ORAL | Status: DC | PRN
  Filled 2012-11-29: qty 5

## 2012-11-29 MED ORDER — FLUTICASONE PROPIONATE 50 MCG/ACT NA SUSP: 1.0000 | Freq: Every day | NASAL | Status: DC | PRN

## 2012-11-29 MED ORDER — METOCLOPRAMIDE HCL 5 MG/ML IJ SOLN: 5.0000 mg | Freq: Three times a day (TID) | INTRAMUSCULAR | Status: DC | PRN

## 2012-11-29 MED ORDER — POLYETHYLENE GLYCOL 3350 17 G PO PACK
17.0000 g | PACK | Freq: Every day | ORAL | Status: DC | PRN
  Administered 2012-12-02: 17 g via ORAL

## 2012-11-29 MED ORDER — DIPHENHYDRAMINE HCL 12.5 MG/5ML PO ELIX: 12.5000 mg | ORAL_SOLUTION | ORAL | Status: DC | PRN

## 2012-11-29 MED ORDER — ONDANSETRON HCL 4 MG/2ML IJ SOLN
INTRAMUSCULAR | Status: DC | PRN
  Administered 2012-11-29: 4 mg via INTRAVENOUS

## 2012-11-29 MED ORDER — BUPIVACAINE HCL (PF) 0.5 % IJ SOLN
INTRAMUSCULAR | Status: DC | PRN
  Administered 2012-11-29: 15 mg

## 2012-11-29 MED ORDER — RIVAROXABAN 10 MG PO TABS
10.0000 mg | ORAL_TABLET | Freq: Every day | ORAL | Status: DC
  Administered 2012-11-30 – 2012-12-02 (×3): 10 mg via ORAL
  Filled 2012-11-29 (×4): qty 1

## 2012-11-29 MED ORDER — ONDANSETRON HCL 4 MG PO TABS
4.0000 mg | ORAL_TABLET | Freq: Four times a day (QID) | ORAL | Status: DC | PRN
  Administered 2012-11-30: 4 mg via ORAL
  Filled 2012-11-29: qty 1

## 2012-11-29 MED ORDER — CEFAZOLIN SODIUM 1-5 GM-% IV SOLN
1.0000 g | Freq: Four times a day (QID) | INTRAVENOUS | Status: AC
  Administered 2012-11-29 (×2): 1 g via INTRAVENOUS
  Filled 2012-11-29 (×2): qty 50

## 2012-11-29 MED ORDER — PROPOFOL 10 MG/ML IV BOLUS
INTRAVENOUS | Status: DC | PRN
  Administered 2012-11-29: 30 mg via INTRAVENOUS

## 2012-11-29 MED ORDER — ACETAMINOPHEN 10 MG/ML IV SOLN: 1000.0000 mg | Freq: Once | INTRAVENOUS | Status: DC

## 2012-11-29 MED ORDER — MORPHINE SULFATE 2 MG/ML IJ SOLN
1.0000 mg | INTRAMUSCULAR | Status: DC | PRN
  Administered 2012-11-30 (×2): 2 mg via INTRAVENOUS
  Administered 2012-11-30: 1 mg via INTRAVENOUS
  Filled 2012-11-29 (×3): qty 1

## 2012-11-29 MED ORDER — BUPIVACAINE LIPOSOME 1.3 % IJ SUSP
20.0000 mL | Freq: Once | INTRAMUSCULAR | Status: DC
  Filled 2012-11-29: qty 20

## 2012-11-29 MED ORDER — SODIUM CHLORIDE 0.9 % IJ SOLN
INTRAMUSCULAR | Status: DC | PRN
  Administered 2012-11-29: 50 mL

## 2012-11-29 MED ORDER — SODIUM CHLORIDE 0.9 % IV SOLN
INTRAVENOUS | Status: DC
  Administered 2012-11-29 – 2012-11-30 (×2): via INTRAVENOUS

## 2012-11-29 MED ORDER — PROPOFOL INFUSION 10 MG/ML OPTIME
INTRAVENOUS | Status: DC | PRN
  Administered 2012-11-29: 75 ug/kg/min via INTRAVENOUS

## 2012-11-29 MED ORDER — FENTANYL CITRATE 0.05 MG/ML IJ SOLN
INTRAMUSCULAR | Status: DC | PRN
  Administered 2012-11-29 (×2): 50 ug via INTRAVENOUS

## 2012-11-29 MED ORDER — ONDANSETRON HCL 4 MG/2ML IJ SOLN: 4.0000 mg | Freq: Four times a day (QID) | INTRAMUSCULAR | Status: DC | PRN

## 2012-11-29 MED ORDER — HYDROMORPHONE HCL PF 1 MG/ML IJ SOLN
0.2500 mg | INTRAMUSCULAR | Status: DC | PRN
  Administered 2012-11-29 (×2): 0.5 mg via INTRAVENOUS

## 2012-11-29 MED ORDER — ACETAMINOPHEN 10 MG/ML IV SOLN: 1000.0000 mg | Freq: Once | INTRAVENOUS | Status: DC | PRN

## 2012-11-29 MED ORDER — BISACODYL 10 MG RE SUPP: 10.0000 mg | Freq: Every day | RECTAL | Status: DC | PRN

## 2012-11-29 MED ORDER — PROMETHAZINE HCL 25 MG/ML IJ SOLN: 6.2500 mg | INTRAMUSCULAR | Status: DC | PRN

## 2012-11-29 MED ORDER — SODIUM CHLORIDE 0.9 % IV SOLN: INTRAVENOUS | Status: DC

## 2012-11-29 MED ORDER — DOCUSATE SODIUM 100 MG PO CAPS
100.0000 mg | ORAL_CAPSULE | Freq: Two times a day (BID) | ORAL | Status: DC
  Administered 2012-11-29 – 2012-12-02 (×7): 100 mg via ORAL

## 2012-11-29 MED ORDER — METOCLOPRAMIDE HCL 10 MG PO TABS: 5.0000 mg | ORAL_TABLET | Freq: Three times a day (TID) | ORAL | Status: DC | PRN

## 2012-11-29 MED ORDER — OXYCODONE HCL 5 MG PO TABS: 5.0000 mg | ORAL_TABLET | Freq: Once | ORAL | Status: DC | PRN

## 2012-11-29 MED ORDER — BACITRACIN-NEOMYCIN-POLYMYXIN OINTMENT TUBE
TOPICAL_OINTMENT | CUTANEOUS | Status: DC | PRN
  Administered 2012-11-29: 1 via TOPICAL

## 2012-11-29 MED ORDER — CEFAZOLIN SODIUM-DEXTROSE 2-3 GM-% IV SOLR
2.0000 g | INTRAVENOUS | Status: AC
  Administered 2012-11-29: 2 g via INTRAVENOUS

## 2012-11-29 MED ORDER — CHLORHEXIDINE GLUCONATE 4 % EX LIQD
60.0000 mL | Freq: Once | CUTANEOUS | Status: DC
  Filled 2012-11-29: qty 60

## 2012-11-29 MED ORDER — MENTHOL 3 MG MT LOZG: 1.0000 | LOZENGE | OROMUCOSAL | Status: DC | PRN

## 2012-11-29 MED ORDER — OXYCODONE HCL 5 MG PO TABS
5.0000 mg | ORAL_TABLET | ORAL | Status: DC | PRN
  Administered 2012-11-29: 10 mg via ORAL
  Administered 2012-11-29 (×2): 5 mg via ORAL
  Administered 2012-11-29 – 2012-11-30 (×6): 10 mg via ORAL
  Administered 2012-12-01 (×2): 5 mg via ORAL
  Administered 2012-12-01 (×2): 10 mg via ORAL
  Administered 2012-12-01 – 2012-12-02 (×2): 5 mg via ORAL
  Filled 2012-11-29: qty 2
  Filled 2012-11-29 (×2): qty 1
  Filled 2012-11-29: qty 2
  Filled 2012-11-29: qty 1
  Filled 2012-11-29: qty 2
  Filled 2012-11-29: qty 1
  Filled 2012-11-29 (×2): qty 2
  Filled 2012-11-29: qty 1
  Filled 2012-11-29 (×5): qty 2

## 2012-11-29 MED ORDER — METHOCARBAMOL 100 MG/ML IJ SOLN
500.0000 mg | Freq: Four times a day (QID) | INTRAVENOUS | Status: DC | PRN
  Administered 2012-11-29: 500 mg via INTRAVENOUS
  Filled 2012-11-29: qty 5

## 2012-11-29 MED ORDER — 0.9 % SODIUM CHLORIDE (POUR BTL) OPTIME
TOPICAL | Status: DC | PRN
  Administered 2012-11-29: 1000 mL

## 2012-11-29 MED ORDER — MIDAZOLAM HCL 5 MG/5ML IJ SOLN
INTRAMUSCULAR | Status: DC | PRN
  Administered 2012-11-29: 2 mg via INTRAVENOUS

## 2012-11-29 MED ORDER — MEPERIDINE HCL 50 MG/ML IJ SOLN: 6.2500 mg | INTRAMUSCULAR | Status: DC | PRN

## 2012-11-29 MED ORDER — ATORVASTATIN CALCIUM 40 MG PO TABS
40.0000 mg | ORAL_TABLET | Freq: Every day | ORAL | Status: DC
  Administered 2012-11-29 – 2012-12-01 (×3): 40 mg via ORAL
  Filled 2012-11-29 (×4): qty 1

## 2012-11-29 MED ORDER — LACTATED RINGERS IV SOLN
INTRAVENOUS | Status: DC | PRN
  Administered 2012-11-29 (×3): via INTRAVENOUS

## 2012-11-29 SURGICAL SUPPLY — 43 items
BAG SPEC THK2 15X12 ZIP CLS (MISCELLANEOUS)
BAG ZIPLOCK 12X15 (MISCELLANEOUS) ×2 IMPLANT
BLADE SAW SGTL 18X1.27X75 (BLADE) ×2 IMPLANT
CLOTH BEACON ORANGE TIMEOUT ST (SAFETY) ×2 IMPLANT
CLSR STERI-STRIP ANTIMIC 1/2X4 (GAUZE/BANDAGES/DRESSINGS) ×4 IMPLANT
DECANTER SPIKE VIAL GLASS SM (MISCELLANEOUS) ×2 IMPLANT
DRAPE C-ARM 42X72 X-RAY (DRAPES) ×2 IMPLANT
DRAPE STERI IOBAN 125X83 (DRAPES) ×2 IMPLANT
DRAPE U-SHAPE 47X51 STRL (DRAPES) ×6 IMPLANT
DRSG ADAPTIC 3X8 NADH LF (GAUZE/BANDAGES/DRESSINGS) ×2 IMPLANT
DRSG MEPILEX BORDER 4X4 (GAUZE/BANDAGES/DRESSINGS) ×1 IMPLANT
DRSG MEPILEX BORDER 4X8 (GAUZE/BANDAGES/DRESSINGS) ×2 IMPLANT
DURAPREP 26ML APPLICATOR (WOUND CARE) ×2 IMPLANT
ELECT BLADE 6.5 EXT (BLADE) ×2 IMPLANT
ELECT REM PT RETURN 9FT ADLT (ELECTROSURGICAL) ×2
ELECTRODE REM PT RTRN 9FT ADLT (ELECTROSURGICAL) ×1 IMPLANT
EVACUATOR 1/8 PVC DRAIN (DRAIN) ×1 IMPLANT
FACESHIELD LNG OPTICON STERILE (SAFETY) ×8 IMPLANT
GLOVE BIO SURGEON STRL SZ8 (GLOVE) ×3 IMPLANT
GLOVE BIOGEL PI IND STRL 8 (GLOVE) ×1 IMPLANT
GLOVE BIOGEL PI INDICATOR 8 (GLOVE) ×2
GLOVE ECLIPSE 8.0 STRL XLNG CF (GLOVE) ×2 IMPLANT
GOWN STRL NON-REIN LRG LVL3 (GOWN DISPOSABLE) ×3 IMPLANT
GOWN STRL REIN XL XLG (GOWN DISPOSABLE) ×2 IMPLANT
IMMOBILIZER KNEE 20 (SOFTGOODS) ×2
IMMOBILIZER KNEE 20 THIGH 36 (SOFTGOODS) IMPLANT
KIT BASIN OR (CUSTOM PROCEDURE TRAY) ×2 IMPLANT
NDL SAFETY ECLIPSE 18X1.5 (NEEDLE) ×1 IMPLANT
NEEDLE HYPO 18GX1.5 SHARP (NEEDLE) ×2
PACK TOTAL JOINT (CUSTOM PROCEDURE TRAY) ×2 IMPLANT
PADDING CAST COTTON 6X4 STRL (CAST SUPPLIES) ×2 IMPLANT
SPONGE GAUZE 4X4 12PLY (GAUZE/BANDAGES/DRESSINGS) ×2 IMPLANT
SUCTION FRAZIER 12FR DISP (SUCTIONS) ×2 IMPLANT
SUT ETHIBOND NAB CT1 #1 30IN (SUTURE) ×5 IMPLANT
SUT MNCRL AB 4-0 PS2 18 (SUTURE) ×2 IMPLANT
SUT VIC AB 1 CT1 27 (SUTURE)
SUT VIC AB 1 CT1 27XBRD ANTBC (SUTURE) ×1 IMPLANT
SUT VIC AB 2-0 CT1 27 (SUTURE) ×2
SUT VIC AB 2-0 CT1 TAPERPNT 27 (SUTURE) ×2 IMPLANT
SUT VLOC 180 0 24IN GS25 (SUTURE) ×2 IMPLANT
SYR 50ML LL SCALE MARK (SYRINGE) ×2 IMPLANT
TOWEL OR 17X26 10 PK STRL BLUE (TOWEL DISPOSABLE) ×3 IMPLANT
TRAY FOLEY CATH 14FRSI W/METER (CATHETERS) ×2 IMPLANT

## 2012-11-29 NOTE — H&P (View-Only) (Signed)
Mary Summers  DOB: 01/27/1932 Married / Language: English / Race: White Female  Date of Admission:  11/29/2012  Chief complaint:  Left Hip Pain  History of Present Illness The patient is scheduled for a left total hip arthroplasty to be performed by Dr. Frank V. Aluisio, MD at [facility] on 11/29/12. The patient is a 78 year old female who presents with a hip problem. The patient was seen for a second opinion.The patient reports left hip problems including pain and snapping symptoms that have been present for year(s). The symptoms began without any known injury. The patient reports symptoms radiating to the: left groin, left thigh posteriorly and left knee.The patient feels as if their symptoms are does feel they are worsening. Prior to being seen today the patient was previously evaluated by a colleague 2 year(s) ago. Previous workup for this problem has included hip x-rays. Previous treatment for this problem has included physical therapy (Her office visit from Dr. Murphy states she had taken some voltaren, but she states she has never taken any NSAIDs for her hip problem). She is now in pain at all times. The hip is limiting what she can and can not do. She has pain in her groin an anterior thigh. It is not radiating all the way to her knee. She is not having any right hip pain at this time. There is a strong family history of arthritis and needing hip replacements. She feels as though the hip is preventing her from doing things she desires. The pain is even keeping her awake at night. She is ready to get this fixed. They have been treated conservatively in the past for the above stated problem and despite conservative measures, they continue to have progressive pain and severe functional limitations and dysfunction. They have failed non-operative management including home exercise, medications, and injections. It is felt that they would benefit from undergoing total joint replacement.  Risks and benefits of the procedure have been discussed with the patient and they elect to proceed with surgery. There are no active contraindications to surgery such as ongoing infection or rapidly progressive neurological disease.   Problem List Osteoarthritis, Hip (715.35)   Allergies No Known Drug Allergies   Family History Congestive Heart Failure. sister Heart Disease. sister Cerebrovascular Accident. sister Cancer. sister Osteoarthritis. mother   Social History Drug/Alcohol Rehab (Previously). no Exercise. Exercises daily; does other Drug/Alcohol Rehab (Currently). no Children. 0 Current work status. retired Illicit drug use. no Tobacco use. never smoker Pain Contract. no Living situation. live with spouse Marital status. married Alcohol use. current drinker; drinks beer, wine and hard liquor   Medication History Lipitor ( Oral) Specific dose unknown - Active. Aspirin Childrens (81MG Tablet Chewable, Oral) Active.   Past Surgical History Breast Biopsy. bilateral  Medical History Osteoarthritis Hypercholesterolemia Diverticulitis Of Colon   Review of Systems General:Not Present- Chills, Fever, Night Sweats, Fatigue, Weight Gain, Weight Loss and Memory Loss. Skin:Not Present- Hives, Itching, Rash, Eczema and Lesions. HEENT:Not Present- Tinnitus, Headache, Double Vision, Visual Loss, Hearing Loss and Dentures. Respiratory:Not Present- Shortness of breath with exertion, Shortness of breath at rest, Allergies, Coughing up blood and Chronic Cough. Cardiovascular:Not Present- Chest Pain, Racing/skipping heartbeats, Difficulty Breathing Lying Down, Murmur, Swelling and Palpitations. Gastrointestinal:Not Present- Bloody Stool, Heartburn, Abdominal Pain, Vomiting, Nausea, Constipation, Diarrhea, Difficulty Swallowing, Jaundice and Loss of appetitie. Female Genitourinary:Not Present- Blood in Urine, Urinary frequency, Weak urinary  stream, Discharge, Flank Pain, Incontinence, Painful Urination, Urgency, Urinary Retention and Urinating at Night.   Musculoskeletal:Present- Joint Pain. Not Present- Muscle Weakness, Muscle Pain, Joint Swelling, Back Pain, Morning Stiffness and Spasms. Neurological:Not Present- Tremor, Dizziness, Blackout spells, Paralysis, Difficulty with balance and Weakness. Psychiatric:Not Present- Insomnia.   Vitals Weight: 175 lb Height: 67 in Weight was reported by patient. Height was reported by patient. Body Surface Area: 1.94 m Body Mass Index: 27.41 kg/m Pulse: 84 (Regular) Resp.: 12 (Unlabored) BP: 138/78 (Sitting, Right Arm, Standard)    Physical Exam The physical exam findings are as follows:   General Mental Status - Alert, cooperative and good historian. General Appearance- pleasant. Not in acute distress. Orientation- Oriented X3. Build & Nutrition- Well nourished and Well developed.   Head and Neck Head- normocephalic, atraumatic . Neck Global Assessment- supple. no bruit auscultated on the right and no bruit auscultated on the left.   Eye Pupil- Bilateral- Regular and Round. Motion- Bilateral- EOMI.   Chest and Lung Exam Auscultation: Breath sounds:- clear at anterior chest wall and - clear at posterior chest wall. Adventitious sounds:- No Adventitious sounds.   Cardiovascular Auscultation:Rhythm- Regular rate and rhythm. Heart Sounds- S1 WNL and S2 WNL. Murmurs & Other Heart Sounds:Auscultation of the heart reveals - No Murmurs.   Abdomen Palpation/Percussion:Tenderness- Abdomen is non-tender to palpation. Rigidity (guarding)- Abdomen is soft. Auscultation:Auscultation of the abdomen reveals - Bowel sounds normal.   Female Genitourinary Not done, not pertinent to present illness  Musculoskeletal She is a very pleasant, well developed female in no apparent distress. Evaluation of her right hip shows a normal  range of motion with no discomfort. The left hip flexion is to 100. There is about 10 degrees of external rotation. She does not have any trochanteric tenderness. The knee exam is normal. Pulses, sensation and motor are intact.  RADIOGRAPHS: AP of the pelvis and lateral of the left hip show end stage arthritis, bone on bone. There is a large osteophyte formation inferiorly.  Assessment & Plan Osteoarthritis, Hip (715.35) Impression: Left Hip  Note: Patient is for a left total hip repalcement - anterior approach.  Plan is to go home.  PCP - Dr. Bruce Swords  Drew Perkins, PA-C  

## 2012-11-29 NOTE — Interval H&P Note (Signed)
History and Physical Interval Note:  11/29/2012 8:11 AM  Mary Summers  has presented today for surgery, with the diagnosis of osteoarthritis of the Left Hip  The various methods of treatment have been discussed with the patient and family. After consideration of risks, benefits and other options for treatment, the patient has consented to  Procedure(s) (LRB) with comments: TOTAL HIP ARTHROPLASTY ANTERIOR APPROACH (Left) as a surgical intervention .  The patient's history has been reviewed, patient examined, no change in status, stable for surgery.  I have reviewed the patient's chart and labs.  Questions were answered to the patient's satisfaction.     Loanne Drilling

## 2012-11-29 NOTE — Anesthesia Procedure Notes (Signed)
Spinal  Patient location during procedure: OR Start time: 11/29/2012 8:35 AM End time: 11/29/2012 8:40 AM Staffing CRNA/Resident: Paris Lore Performed by: resident/CRNA  Preanesthetic Checklist Completed: patient identified, site marked, surgical consent, pre-op evaluation, timeout performed, IV checked, risks and benefits discussed and monitors and equipment checked Spinal Block Patient position: sitting Prep: Betadine Patient monitoring: heart rate, continuous pulse ox and blood pressure Approach: right paramedian Location: L2-3 Injection technique: single-shot Needle Needle type: Spinocan  Needle gauge: 22 G Needle length: 9 cm Assessment Sensory level: T4 Additional Notes Expiration date of kit checked and confirmed. Patient tolerated procedure well, without complications. X 1 attempt with clear CSF return easy aspiration and flush.  Patient tolerated procedure well.

## 2012-11-29 NOTE — Progress Notes (Signed)
Utilization review completed.  

## 2012-11-29 NOTE — Anesthesia Postprocedure Evaluation (Signed)
Anesthesia Post Note  Patient: Mary Summers  Procedure(s) Performed: Procedure(s) (LRB): TOTAL HIP ARTHROPLASTY ANTERIOR APPROACH (Left)  Anesthesia type: Spinal  Patient location: PACU  Post pain: Pain level controlled  Post assessment: Post-op Vital signs reviewed  Last Vitals: BP 129/74  Pulse 85  Temp 36.6 C (Oral)  Resp 18  Ht 5\' 7"  (1.702 m)  Wt 184 lb (83.462 kg)  BMI 28.82 kg/m2  SpO2 100%  Post vital signs: Reviewed  Level of consciousness: sedated  Complications: No apparent anesthesia complications

## 2012-11-29 NOTE — Anesthesia Preprocedure Evaluation (Addendum)
Anesthesia Evaluation  Patient identified by MRN, date of birth, ID band Patient awake    Reviewed: Allergy & Precautions, H&P , NPO status , Patient's Chart, lab work & pertinent test results  Airway Mallampati: II TM Distance: >3 FB Neck ROM: Full    Dental  (+) Dental Advisory Given and Teeth Intact   Pulmonary COPD breath sounds clear to auscultation  Pulmonary exam normal       Cardiovascular + Peripheral Vascular Disease Rhythm:Regular Rate:Normal     Neuro/Psych negative neurological ROS  negative psych ROS   GI/Hepatic negative GI ROS, Neg liver ROS,   Endo/Other  negative endocrine ROS  Renal/GU negative Renal ROS     Musculoskeletal negative musculoskeletal ROS (+)   Abdominal   Peds  Hematology negative hematology ROS (+)   Anesthesia Other Findings   Reproductive/Obstetrics                          Anesthesia Physical Anesthesia Plan  ASA: II  Anesthesia Plan: Spinal   Post-op Pain Management:    Induction: Intravenous  Airway Management Planned: Simple Face Mask  Additional Equipment:   Intra-op Plan:   Post-operative Plan: Extubation in OR  Informed Consent: I have reviewed the patients History and Physical, chart, labs and discussed the procedure including the risks, benefits and alternatives for the proposed anesthesia with the patient or authorized representative who has indicated his/her understanding and acceptance.   Dental advisory given  Plan Discussed with: CRNA  Anesthesia Plan Comments:        Anesthesia Quick Evaluation

## 2012-11-29 NOTE — Evaluation (Signed)
Physical Therapy Evaluation Patient Details Name: Mary Summers MRN: [id number] DOB: 1932/02/03 Today's Date: 11/29/2012 Time: 1530-1605 PT Time Calculation (min): 35 min  PT Assessment / Plan / Recommendation Clinical Impression  s/p direct anterior Left THA and will benefit from PT to maximize independence for home with family assist    PT Assessment  Patient needs continued PT services    Follow Up Recommendations  Home health PT    Does the patient have the potential to tolerate intense rehabilitation      Barriers to Discharge        Equipment Recommendations  Rolling walker with 5" wheels    Recommendations for Other Services     Frequency 7X/week    Precautions / Restrictions Precautions Precautions: None Restrictions Weight Bearing Restrictions: No   Pertinent Vitals/Pain      Mobility  Bed Mobility Bed Mobility: Supine to Sit Supine to Sit: 4: Min assist;HOB flat Details for Bed Mobility Assistance: min with LLE Transfers Transfers: Sit to Stand;Stand to Sit Sit to Stand: 4: Min assist;From bed Stand to Sit: 4: Min assist;To chair/3-in-1 Details for Transfer Assistance: cue sfor hand placement Ambulation/Gait Ambulation/Gait Assistance: 4: Min assist Ambulation Distance (Feet): 22 Feet Assistive device: Rolling walker Ambulation/Gait Assistance Details: cues for sequence and RW distance from self Gait Pattern: Step-to pattern    Shoulder Instructions     Exercises     PT Diagnosis: Difficulty walking  PT Problem List: Decreased strength;Decreased range of motion;Decreased activity tolerance;Decreased mobility;Decreased knowledge of use of DME;Decreased safety awareness PT Treatment Interventions: DME instruction;Gait training;Stair training;Functional mobility training;Therapeutic activities;Therapeutic exercise;Patient/family education   PT Goals Acute Rehab PT Goals PT Goal Formulation: With patient Time For Goal Achievement:  12/06/12 Potential to Achieve Goals: Good Pt will go Supine/Side to Sit: with supervision PT Goal: Supine/Side to Sit - Progress: Goal set today Pt will go Sit to Supine/Side: with supervision PT Goal: Sit to Supine/Side - Progress: Goal set today Pt will go Sit to Stand: with supervision PT Goal: Sit to Stand - Progress: Goal set today Pt will go Stand to Sit: with supervision PT Goal: Stand to Sit - Progress: Goal set today Pt will Ambulate: 51 - 150 feet;with rolling walker;with min assist PT Goal: Ambulate - Progress: Goal set today Pt will Go Up / Down Stairs: 1-2 stairs;with min assist;with least restrictive assistive device PT Goal: Up/Down Stairs - Progress: Goal set today Pt will Perform Home Exercise Program: with supervision, verbal cues required/provided PT Goal: Perform Home Exercise Program - Progress: Goal set today  Visit Information  Last PT Received On: 11/29/12 Assistance Needed: +1    Subjective Data  Subjective: I will get up Patient Stated Goal: home   Prior Functioning  Home Living Lives With: Spouse Type of Home: House Home Access: Stairs to enter [employer] of Steps: 1 and 1 Entrance Stairs-Rails: None Home Layout: One level Home Adaptive Equipment: Raised toilet seat with rails Prior Function Level of Independence: Independent Communication Communication: No difficulties    Cognition  Overall Cognitive Status: Appears within functional limits for tasks assessed/performed Arousal/Alertness: Awake/alert Orientation Level: Appears intact for tasks assessed Behavior During Session: [facility] for tasks performed    Extremity/Trunk Assessment Right Lower Extremity Assessment RLE ROM/Strength/Tone: [facility] for tasks assessed Left Lower Extremity Assessment LLE ROM/Strength/Tone: Deficits;Due to pain LLE ROM/Strength/Tone Deficits: hip flexion limited due to pain; ankle and knee grossly WFL   Balance    End of Session PT - End of  Session  Activity Tolerance: Patient tolerated treatment well Patient left: with call bell/phone within reach;with family/visitor present;in chair Nurse Communication: Mobility status  GP     [facility] 11/29/2012, 4:30 PM

## 2012-11-29 NOTE — Transfer of Care (Addendum)
Immediate Anesthesia Transfer of Care Note  Patient: Mary Summers  Procedure(s) Performed: Procedure(s) (LRB): TOTAL HIP ARTHROPLASTY ANTERIOR APPROACH (Left)  Patient Location: PACU  Anesthesia Type: Spinal  Level of Consciousness: sedated, patient cooperative and responds to stimulaton  Airway & Oxygen Therapy: Patient Spontanous Breathing and Patient connected to face mask oxgen  Post-op Assessment: Report given to PACU RN and Post -op Vital signs reviewed and stable  Post vital signs: Reviewed and stable  Complications: No apparent anesthesia complications  Level S1 on exam with patient denied pain with only slight movement of lower ext on request.

## 2012-11-29 NOTE — Brief Op Note (Signed)
11/29/2012  10:37 AM  PATIENT:  Mary Summers  78 y.o. female  PRE-OPERATIVE DIAGNOSIS:  osteoarthritis of the Left Hip  POST-OPERATIVE DIAGNOSIS:  osteoarthritis of the Left Hip  PROCEDURE:  Procedure(s) (LRB) with comments: TOTAL HIP ARTHROPLASTY ANTERIOR APPROACH (Left)  SURGEON:  Surgeon(s) and Role:    * Loanne Drilling, MD - Primary  PHYSICIAN ASSISTANT:   ASSISTANTS: Leilani Able, PA-C   ANESTHESIA:   spinal  EBL:  Total I/O In: 2000 [I.V.:2000] Out: 800 [Urine:100; Blood:700]  BLOOD ADMINISTERED:none  DRAINS: (Medium) Hemovact drain(s) in the left hip with  Suction Open   LOCAL MEDICATIONS USED:  OTHER Exparel 20 ml  COUNTS:  YES  TOURNIQUET:  * No tourniquets in log *  DICTATION: .Other Dictation: Dictation Number [id number]  PLAN OF CARE: Admit to inpatient   PATIENT DISPOSITION:  PACU - hemodynamically stable.

## 2012-11-29 NOTE — Op Note (Signed)
NAMEGRACIN, [name]              ACCOUNT NO.:  [id number]  MEDICAL RECORD NO.:  [id number]  LOCATION:  1607                         FACILITY:  [facility]  PHYSICIAN:  Ollen Gross, M.D.    DATE OF BIRTH:  [birth year]  DATE OF PROCEDURE:  11/29/2012 DATE OF DISCHARGE:                              OPERATIVE REPORT   PREOPERATIVE DIAGNOSIS:  Osteoarthritis, left hip.  POSTOPERATIVE DIAGNOSIS:  Osteoarthritis, left hip.  PROCEDURE:  Left total hip arthroplasty, anterior approach.  SURGEON:  Ollen Gross, MD  ASSISTANT:  Jaquelyn Bitter. Chabon, PA-C  ANESTHESIA:  Spinal.  ESTIMATED BLOOD LOSS:  700.  DRAINS:  Hemovac x1.  COMPLICATIONS:  None.  CONDITION:  Stable to recovery.  BRIEF CLINICAL NOTE:  Mary Summers is a 78 year old female with end-stage arthritis of the left hip with progressively worsening pain and dysfunction.  She has bone-on-bone arthritis with subchondral cyst formation.  She presents now for left total hip arthroplasty.  PROCEDURE IN DETAIL:  After successful administration of spinal anesthetic, the patient's both lower feet were placed in the traction boots, and she was placed on to the [place] bed.  Right lower extremity was placed into the leg holder as is the left.  X-rays taken AP pelvis to get the pelvis centered as well as an AP hip.  The thigh was then isolated with plastic drapes and prepped and draped in a usual sterile fashion.  The ASIS is marked and incision started about 2 cm lateral and 1 cm distal to the ASIS coursing towards the femur.  Incision was made with a 10 blade through subcutaneous tissue to the fascia over the tensor fascia lata.  This was then incised and the muscle was dissected off of the fascia to get into the plane between the tensor and rectus. Rectus fibers were identified and was retracted anteriorly.  Capsule was identified, precapsular fat removed.  Ascending branch of lateral femoral circumflex identified and cauterized.  The  capsule was then incised in a T-shaped incision.  The femoral head was identified and retractors placed around the inferior superior femoral neck.  The cutting guide was placed and osteotomy was made with an oscillating saw. The corkscrew was placed into the femoral head and the femoral head subsequently removed.  Gentle traction was applied to the femur and then acetabular retractors were placed to gain acetabular exposure.  Labrum and osteophytes were removed off the acetabulum.  Acetabular reaming starts at 47 mm coursing increments of 2-51 mm and then a 52 mm pinnacle acetabular shell was placed in anatomic position.  No dome screws were necessary as we had excellent purchase.  We confirmed the acetabular component to be in anatomic position on fluoroscopic views.  Femoral hook was placed and the then femur externally rotated 90 degrees and placed into extension, adduction, and external rotation.  The retractors were placed around the proximal femur.  The capsule was trimmed off of the piriformis fossa.  We removed some spur to get to the piriformis fossa.  The chili pepper starter reamer was passed and shown to be in the canal on the AP and lateral views.  We then started broaching with size 8, coursing increments of  1 up to a size 12 for a Corail stem.  With the 12, took AP and lateral views and it showed to be a good fit in the canal.  She had high offset and we placed the KLA trial neck which was the high offset neck with a 32 +1.5 head.  All traction was taken off and the hip was reduced.  There was excellent stability up to about 70 degrees external rotation, full extension. There was no impingement posteriorly with internal rotation.  AP pelvis shows that the leg lengths are within a mm of being equal.  All traction was removed and hip was dislocated.  The trial stem is removed.  The size 12 Corail KLA stem is then impacted into the femur with excellent purchase.  The 32+ 1.5  metal head is placed and the hip is reduced. Note that earlier we had placed the 32 mm neutral +4 marathon liner. Hip was reduced into the liner and AP pelvis again taken showing leg lengths within 1 mm being equal.  The wound was then copiously irrigated with saline solution and the capsule repaired with the Ethibond suture. Exparel was then injected into the tensor fascia lata, rectus muscles, and in the subcu tissues and capsule.  This was 20 mL of Exparel mixed with 50 mL of saline.  The fascia over the tensor fascia lata was then closed with a running #1 V-Loc suture.  Hemovac drain was placed distal to the incision.  The subcu was then closed with interrupted 2-0 Vicryl subcuticular and running 4-0 Monocryl.  Incisions cleaned and dried and Steri-Strips and a sterile dressing applied.  She was then awakened and transported to recovery in stable condition.     Ollen Gross, M.D.     FA/MEDQ  D:  11/29/2012  T:  11/29/2012  Job:  [id number]

## 2012-11-30 ENCOUNTER — Other Ambulatory Visit: Payer: Self-pay

## 2012-11-30 DIAGNOSIS — D62 Acute posthemorrhagic anemia: Secondary | ICD-10-CM | POA: Diagnosis not present

## 2012-11-30 DIAGNOSIS — M169 Osteoarthritis of hip, unspecified: Secondary | ICD-10-CM | POA: Diagnosis not present

## 2012-11-30 DIAGNOSIS — I739 Peripheral vascular disease, unspecified: Secondary | ICD-10-CM | POA: Diagnosis not present

## 2012-11-30 LAB — CBC
HCT: 33.4 % — ABNORMAL LOW (ref 36.0–46.0)
MCV: 89.5 fL (ref 78.0–100.0)
Platelets: 165 10*3/uL (ref 150–400)
RBC: 3.73 MIL/uL — ABNORMAL LOW (ref 3.87–5.11)
WBC: 7.4 10*3/uL (ref 4.0–10.5)

## 2012-11-30 LAB — BASIC METABOLIC PANEL
CO2: 27 mEq/L (ref 19–32)
Chloride: 101 mEq/L (ref 96–112)
Creatinine, Ser: 0.59 mg/dL (ref 0.50–1.10)
Potassium: 3.7 mEq/L (ref 3.5–5.1)

## 2012-11-30 NOTE — Progress Notes (Signed)
Physical Therapy Treatment Patient Details Name: XUAN MATEUS MRN: [id number] DOB: [birth year] Today's Date: 11/30/2012 Time: 1308-6578 PT Time Calculation (min): 36 min  PT Assessment / Plan / Recommendation Comments on Treatment Session       Follow Up Recommendations  Home health PT     Does the patient have the potential to tolerate intense rehabilitation     Barriers to Discharge        Equipment Recommendations  Rolling walker with 5" wheels    Recommendations for Other Services OT consult  Frequency 7X/week   Plan Discharge plan remains appropriate    Precautions / Restrictions Precautions Precautions: None Restrictions Weight Bearing Restrictions: No Other Position/Activity Restrictions: WBAT   Pertinent Vitals/Pain 4/10 but with muscle spasms at session end; RN aware and provided MR, ice pack provided    Mobility  Transfers Transfers: Sit to Stand;Stand to Sit Sit to Stand: 4: Min assist Stand to Sit: 4: Min assist Details for Transfer Assistance: Cues for use of UEs and for LE management Ambulation/Gait Ambulation/Gait Assistance: 4: Min assist Ambulation Distance (Feet): 76 Feet Assistive device: Rolling walker Ambulation/Gait Assistance Details: cues for posture, sequence, position from RW and stride length Gait Pattern: Step-to pattern    Exercises Total Joint Exercises Ankle Circles/Pumps: AROM;Supine;20 reps;Both Quad Sets: AROM;Both;15 reps;Supine Gluteal Sets: AROM;Both;10 reps;Supine Heel Slides: AAROM;20 reps;Supine;Left Hip ABduction/ADduction: AAROM;Left;20 reps;Supine   PT Diagnosis:    PT Problem List:   PT Treatment Interventions:     PT Goals Acute Rehab PT Goals PT Goal Formulation: With patient Time For Goal Achievement: 12/06/12 Potential to Achieve Goals: Good Pt will go Supine/Side to Sit: with supervision PT Goal: Supine/Side to Sit - Progress: Progressing toward goal Pt will go Sit to Supine/Side: with supervision PT  Goal: Sit to Supine/Side - Progress: Progressing toward goal Pt will go Sit to Stand: with supervision PT Goal: Sit to Stand - Progress: Progressing toward goal Pt will go Stand to Sit: with supervision PT Goal: Stand to Sit - Progress: Progressing toward goal Pt will Ambulate: 51 - 150 feet;with rolling walker;with min assist PT Goal: Ambulate - Progress: Progressing toward goal Pt will Go Up / Down Stairs: 1-2 stairs;with min assist;with least restrictive assistive device Pt will Perform Home Exercise Program: with supervision, verbal cues required/provided PT Goal: Perform Home Exercise Program - Progress: Progressing toward goal  Visit Information  Last PT Received On: 11/30/12 Assistance Needed: +1    Subjective Data  Patient Stated Goal: home   Cognition  Overall Cognitive Status: Appears within functional limits for tasks assessed/performed Arousal/Alertness: Awake/alert Orientation Level: Appears intact for tasks assessed Behavior During Session: [facility] for tasks performed    Balance     End of Session PT - End of Session Activity Tolerance: Patient tolerated treatment well Patient left: in chair;with call bell/phone within reach;with family/visitor present Nurse Communication: Mobility status   GP     Kuron Docken 11/30/2012, 1:23 PM

## 2012-11-30 NOTE — Progress Notes (Signed)
Avel Peace, PA notified of bleeding drain site. Previously reinforced dressing had saturated through, as well as Bed pad. RN held pressure 15-20 minutes, with no slowing of blood flow. PA directed RN to redress drain site with 4x4, ABDs, and ACE compression wrap. Dressing complete, and RN will monitor for resaturation closely. Also will closely monitor BP and labs. Bless Belshe [place], [place] 11/30/2012 10:37 AM

## 2012-11-30 NOTE — Progress Notes (Signed)
   Subjective: 1 Day Post-Op Procedure(s) (LRB): TOTAL HIP ARTHROPLASTY ANTERIOR APPROACH (Left) Patient reports pain as mild.   Patient seen in rounds with Dr. Lequita Halt.  Had a rough night the first night but doing better this morning.  Already up into the chair Patient is well, but has had some minor complaints of pain in the hip and groin, requiring pain medications We will start therapy today.  Plan is to go Home after hospital stay.  Objective: Vital signs in last 24 hours: Temp:  [97.4 F (36.3 C)-99.5 F (37.5 C)] 98.8 F (37.1 C) (12/12 0547) Pulse Rate:  [79-123] 106  (12/12 0547) Resp:  [12-20] 16  (12/12 0547) BP: (101-158)/(52-87) 158/87 mmHg (12/12 0547) SpO2:  [93 %-100 %] 98 % (12/12 0547) Weight:  [83.462 kg (184 lb)] 83.462 kg (184 lb) (12/11 1245)  Intake/Output from previous day:  Intake/Output Summary (Last 24 hours) at 11/30/12 0919 Last data filed at 11/30/12 0820  Gross per 24 hour  Intake 5407.5 ml  Output   1675 ml  Net 3732.5 ml    Intake/Output this shift: Total I/O In: 360 [P.O.:360] Out: -   Labs:  Basename 11/30/12 0414  HGB 11.1*    Basename 11/30/12 0414  WBC 7.4  RBC 3.73*  HCT 33.4*  PLT 165    Basename 11/30/12 0414  NA 135  K 3.7  CL 101  CO2 27  BUN 10  CREATININE 0.59  GLUCOSE 123*  CALCIUM 8.1*   No results found for this basename: LABPT:2,INR:2 in the last 72 hours  EXAM General - Patient is Alert, Appropriate and Oriented Extremity - Neurovascular intact Sensation intact distally Dorsiflexion/Plantar flexion intact Dressing - dressing C/D/I, some drainage from the HV site after pulling the drain.  Dressing reinforced. Motor Function - intact, moving foot and toes well on exam.  Hemovac pulled without difficulty.  Past Medical History  Diagnosis Date  . Seasonal allergies   . COPD (chronic obstructive pulmonary disease)     PT HAS BEEN TOLD COPD--BUT SHE DOES NOT NOTICE SOB--HX OF EXPOSURE TO CHEMICALS  SUCH AS LACQUERS FOR FURNITURE REFINISHING--NEVER SMOKED  . Arthritis   . Peripheral vascular disease     MILD CAROTID DISEASE BY DOPPLER STUDY  11/13/12. -REPORT FROM [facility]    Assessment/Plan: 1 Day Post-Op Procedure(s) (LRB): TOTAL HIP ARTHROPLASTY ANTERIOR APPROACH (Left) Principal Problem:  *OA (osteoarthritis) of hip Active Problems:  Postop Acute blood loss anemia  Estimated Body mass index is 28.82 kg/(m^2) as calculated from the following:   Height as of this encounter: 5\' 7" (1.702 m).   Weight as of this encounter: 184 lb(83.462 kg). Advance diet Up with therapy Plan for discharge tomorrow Discharge home with home health  DVT Prophylaxis - Xarelto, ASA 81 mg on hold Weight Bearing As Tolerated left Leg D/C Knee Immobilizer Hemovac Pulled Begin Therapy Hip Preacutions No vaccines.  PERKINS, ALEXZANDREW 11/30/2012, 9:19 AM

## 2012-11-30 NOTE — Progress Notes (Addendum)
Patient's heart rate tachy.  Temperature is elevated.  Tylenol given, pulse oximetry placed on patient to monitor heart rate, incentive spirometer encouraged (patient achieves volume of 2000).  Paged Alphonsa Overall PA, received order for EKG.  Will perform and will continue to monitor.

## 2012-11-30 NOTE — Progress Notes (Signed)
Physical Therapy Treatment Patient Details Name: Mary Summers MRN: [id number] DOB: [birth year] Today's Date: 11/30/2012 Time: 4098-1191 PT Time Calculation (min): 40 min  PT Assessment / Plan / Recommendation Comments on Treatment Session  Increased time all tasks.  Pt to bathroom, ambulated and performing gentle ROM ther ex     Follow Up Recommendations  Home health PT     Does the patient have the potential to tolerate intense rehabilitation     Barriers to Discharge        Equipment Recommendations  Rolling walker with 5" wheels    Recommendations for Other Services OT consult  Frequency 7X/week   Plan Discharge plan remains appropriate    Precautions / Restrictions Precautions Precautions: Fall Restrictions Weight Bearing Restrictions: No Other Position/Activity Restrictions: WBAT   Pertinent Vitals/Pain 7/10 - pt received IV pain MEDs prior to initiating therapy session and ice pack following    Mobility  Bed Mobility Bed Mobility: Supine to Sit Supine to Sit: 4: Min assist;3: Mod assist Details for Bed Mobility Assistance: cues for sequence and use of UEs and R LE to self assist Transfers Transfers: Sit to Stand;Stand to Sit Sit to Stand: 4: Min assist;From chair/3-in-1;With armrests;With upper extremity assist;From bed Stand to Sit: 4: Min assist;To chair/3-in-1;With armrests;With upper extremity assist Details for Transfer Assistance: Cues for use of UEs and for LE management Ambulation/Gait Ambulation/Gait Assistance: 4: Min assist Ambulation Distance (Feet): 76 Feet (and 22 x 2) Assistive device: Rolling walker Ambulation/Gait Assistance Details: cues for posture, sequence, position from RW, stride length, step to gait Gait Pattern: Step-to pattern    Exercises Total Joint Exercises Ankle Circles/Pumps: AROM;Supine;20 reps;Both Heel Slides: AAROM;Supine;Left;15 reps Hip ABduction/ADduction: AAROM;Left;Supine;15 reps   PT Diagnosis:    PT Problem  List:   PT Treatment Interventions:     PT Goals Acute Rehab PT Goals PT Goal Formulation: With patient Time For Goal Achievement: 12/06/12 Potential to Achieve Goals: Good Pt will go Supine/Side to Sit: with supervision PT Goal: Supine/Side to Sit - Progress: Progressing toward goal Pt will go Sit to Supine/Side: with supervision PT Goal: Sit to Supine/Side - Progress: Progressing toward goal Pt will go Sit to Stand: with supervision PT Goal: Sit to Stand - Progress: Progressing toward goal Pt will go Stand to Sit: with supervision PT Goal: Stand to Sit - Progress: Progressing toward goal Pt will Ambulate: 51 - 150 feet;with rolling walker;with min assist PT Goal: Ambulate - Progress: Progressing toward goal Pt will Go Up / Down Stairs: 1-2 stairs;with min assist;with least restrictive assistive device Pt will Perform Home Exercise Program: with supervision, verbal cues required/provided PT Goal: Perform Home Exercise Program - Progress: Progressing toward goal  Visit Information  Last PT Received On: 11/30/12 Assistance Needed: +1    Subjective Data  Subjective: I can't move, its just locked up Patient Stated Goal: home   Cognition  Arousal/Alertness: Awake/alert Orientation Level: Appears intact for tasks assessed Behavior During Session: [facility] for tasks performed    Balance     End of Session PT - End of Session Activity Tolerance: Patient tolerated treatment well Patient left: in chair;with call bell/phone within reach;with family/visitor present Nurse Communication: Mobility status   GP     Symantha Steeber 11/30/2012, 4:47 PM

## 2012-11-30 NOTE — Evaluation (Signed)
Occupational Therapy Evaluation Patient Details Name: Mary Summers MRN: [id number] DOB: 1932-04-05 Today's Date: 11/30/2012 Time: 1000-1040    OT Assessment / Plan / Recommendation Clinical Impression  Pt presents to OT s/p THA (anterior). Pt will benefit from skilled OT to increase I with ADL activity and return to PLOF    OT Assessment  Patient needs continued OT Services    Follow Up Recommendations  Home health OT             Frequency  Min 3X/week    Precautions / Restrictions Precautions Precautions: None Restrictions Weight Bearing Restrictions: No       ADL  Grooming: Simulated;Set up Where Assessed - Grooming: Unsupported sitting Upper Body Bathing: Simulated;Set up Where Assessed - Upper Body Bathing: Unsupported sitting Lower Body Bathing: Performed;Maximal assistance Where Assessed - Lower Body Bathing: Unsupported sit to stand Upper Body Dressing: Simulated;Set up Where Assessed - Upper Body Dressing: Unsupported sitting Lower Body Dressing: Maximal assistance Where Assessed - Lower Body Dressing: Unsupported sit to stand Toilet Transfer: Minimal assistance;Performed Toilet Transfer Method: Sit to stand Toilet Transfer Equipment: Comfort height toilet Toileting - Clothing Manipulation and Hygiene: Performed;Min guard Where Assessed - Glass blower/designer Manipulation and Hygiene: Standing ADL Comments: Husband will assist as able    OT Diagnosis: Generalized weakness;Acute pain  OT Treatment Interventions: Self-care/ADL training;DME and/or AE instruction;Patient/family education   OT Goals Acute Rehab OT Goals OT Goal Formulation: With patient Time For Goal Achievement: 12/14/12 Potential to Achieve Goals: Good ADL Goals Pt Will Perform Grooming: with supervision;Standing at sink ADL Goal: Grooming - Progress: Goal set today Pt Will Perform Lower Body Dressing: with supervision;Sit to stand from chair;with adaptive equipment;Other (comment) (AE  if needed) ADL Goal: Lower Body Dressing - Progress: Goal set today Pt Will Transfer to Toilet: with supervision;Comfort height toilet ADL Goal: Toilet Transfer - Progress: Goal set today Pt Will Perform Toileting - Clothing Manipulation: with supervision;Standing ADL Goal: Toileting - Clothing Manipulation - Progress: Goal set today Pt Will Perform Toileting - Hygiene: with supervision ADL Goal: Toileting - Hygiene - Progress: Goal set today Pt Will Perform Tub/Shower Transfer: Shower transfer;with supervision ADL Goal: Tub/Shower Transfer - Progress: Goal set today  Visit Information  Last OT Received On: 11/30/12    Subjective Data  Subjective: I go home tomorrow   Prior Functioning     Home Living Lives With: Spouse Type of Home: House Home Access: Stairs to enter Entrance Stairs-Number of Steps: 1 and 1 Entrance Stairs-Rails: None Home Layout: One level Bathroom Shower/Tub: Walk-in shower;Door Bathroom Toilet: Handicapped height Home Adaptive Equipment: Straight cane;Reacher Prior Function Level of Independence: Independent Able to Take Stairs?: Yes Driving: Yes Communication Communication: No difficulties            Cognition  Overall Cognitive Status: Appears within functional limits for tasks assessed/performed Arousal/Alertness: Awake/alert Orientation Level: Appears intact for tasks assessed Behavior During Session: [facility] for tasks performed    Extremity/Trunk Assessment Right Upper Extremity Assessment RUE ROM/Strength/Tone: [facility] for tasks assessed Left Upper Extremity Assessment LUE ROM/Strength/Tone: WFL for tasks assessed     Mobility Transfers Transfers: Sit to Stand;Stand to Sit Sit to Stand: 4: Min assist;With upper extremity assist Stand to Sit: 4: Min assist;With upper extremity assist              End of Session OT - End of Session Activity Tolerance: Patient tolerated treatment well Patient left: in chair;with call bell/phone within  reach;with family/visitor present Nurse Communication: Mobility status  GO     Alba Cory 11/30/2012, 10:48 AM

## 2012-12-01 ENCOUNTER — Inpatient Hospital Stay (HOSPITAL_COMMUNITY): Payer: Medicare Other

## 2012-12-01 DIAGNOSIS — J984 Other disorders of lung: Secondary | ICD-10-CM | POA: Diagnosis not present

## 2012-12-01 DIAGNOSIS — I479 Paroxysmal tachycardia, unspecified: Secondary | ICD-10-CM | POA: Diagnosis not present

## 2012-12-01 LAB — URINALYSIS, ROUTINE W REFLEX MICROSCOPIC
Hgb urine dipstick: NEGATIVE
Leukocytes, UA: NEGATIVE
Nitrite: NEGATIVE
Protein, ur: NEGATIVE mg/dL
Specific Gravity, Urine: 1.014 (ref 1.005–1.030)
Urobilinogen, UA: 0.2 mg/dL (ref 0.0–1.0)

## 2012-12-01 LAB — CBC
Hemoglobin: 10.1 g/dL — ABNORMAL LOW (ref 12.0–15.0)
MCHC: 33.4 g/dL (ref 30.0–36.0)
Platelets: 155 10*3/uL (ref 150–400)

## 2012-12-01 LAB — BASIC METABOLIC PANEL
GFR calc Af Amer: >90 mL/min (ref >90)
GFR calc non Af Amer: 83 mL/min — ABNORMAL LOW (ref >90)
Glucose, Bld: 123 mg/dL — ABNORMAL HIGH (ref 70–99)
Potassium: 3.6 mEq/L (ref 3.5–5.1)
Sodium: 134 mEq/L — ABNORMAL LOW (ref 135–145)

## 2012-12-01 MED ORDER — ACETAMINOPHEN 10 MG/ML IV SOLN
1000.0000 mg | Freq: Once | INTRAVENOUS | Status: AC
  Administered 2012-12-01: 1000 mg via INTRAVENOUS
  Filled 2012-12-01: qty 100

## 2012-12-01 NOTE — Consult Note (Signed)
Reason for Consult:  Sinus Tach Referring Physician:   [name] is an 78 y.o. female.  HPI:   The patient is a 78 year old female who is a patient of Dr. Blanchie Dessert. She has no significant past medical history of any cardiac disease.  In May 2013, due to concerns of family history, she did undergo a Building services engineer. That test included carotid Dopplers and EKG, abdominal and peripheral Dopplers as well as the tests for bone density, lipid profile, glucose, etc. In reviewing these, there appears to be mild carotid disease by the Doppler velocities bilaterally. In addition, there is mild to moderate risk of osteoporosis mostly given her age and other risk factors. The lipid profile demonstrated a total cholesterol of 212 with HDL of 54, LDL 136 and triglycerides of 111, and her body mass index was 29.   The patient is two day post op total left hip arthroplasty via anterior approach.  She apparently developed sinus tachycardia post op and we are asked to consult.  During exam her HR is 120 BPM and the patient is asymptomatic.  She had some nausea this morning but denies vomiting, dizziness, CP, SOB, orthopnea, PND, LEE, ADB pain.  Ehg from yesterday revels sinus tach.    Past Medical History  Diagnosis Date  . Seasonal allergies   . COPD (chronic obstructive pulmonary disease)     PT HAS BEEN TOLD COPD--BUT SHE DOES NOT NOTICE SOB--HX OF EXPOSURE TO CHEMICALS SUCH AS LACQUERS FOR FURNITURE REFINISHING--NEVER SMOKED  . Arthritis   . Peripheral vascular disease     MILD CAROTID DISEASE BY DOPPLER STUDY  11/13/12. -REPORT FROM [facility]    Past Surgical History  Procedure Date  . Congenitally absent uterus   . Breast surgery     BREAST BIOPSIES X 2  - NO CANCER  . Laser vocal cord for nodule  1985     History reviewed. No pertinent family history.  Social History:  reports that she has never smoked. She has never used smokeless  tobacco. She reports that she drinks alcohol. She reports that she does not use illicit drugs.  Allergies: No Known Allergies  Medications:     . atorvastatin  40 mg Oral q1800  . docusate sodium  100 mg Oral BID  . rivaroxaban  10 mg Oral Q breakfast     Results for orders placed during the hospital encounter of 11/29/12 (from the past 48 hour(s))  CBC     Status: Abnormal   Collection Time   11/30/12  4:14 AM      Component Value Range Comment   WBC 7.4  4.0 - 10.5 K/uL    RBC 3.73 (*) 3.87 - 5.11 MIL/uL    Hemoglobin 11.1 (*) 12.0 - 15.0 g/dL    HCT 14.7 (*) 82.9 - 46.0 %    MCV 89.5  78.0 - 100.0 fL    MCH 29.8  26.0 - 34.0 pg    MCHC 33.2  30.0 - 36.0 g/dL    RDW 56.2  13.0 - 86.5 %    Platelets 165  150 - 400 K/uL   BASIC METABOLIC PANEL     Status: Abnormal   Collection Time   11/30/12  4:14 AM      Component Value Range Comment   Sodium 135  135 - 145 mEq/L    Potassium 3.7  3.5 - 5.1 mEq/L    Chloride 101  96 -  112 mEq/L    CO2 27  19 - 32 mEq/L    Glucose, Bld 123 (*) 70 - 99 mg/dL    BUN 10  6 - 23 mg/dL    Creatinine, Ser 8.41  0.50 - 1.10 mg/dL    Calcium 8.1 (*) 8.4 - 10.5 mg/dL    GFR calc non Af Amer 86 (*) >90 mL/min    GFR calc Af Amer >90  >90 mL/min   CBC     Status: Abnormal   Collection Time   12/01/12  4:40 AM      Component Value Range Comment   WBC 12.6 (*) 4.0 - 10.5 K/uL    RBC 3.37 (*) 3.87 - 5.11 MIL/uL    Hemoglobin 10.1 (*) 12.0 - 15.0 g/dL    HCT 32.4 (*) 40.1 - 46.0 %    MCV 89.6  78.0 - 100.0 fL    MCH 30.0  26.0 - 34.0 pg    MCHC 33.4  30.0 - 36.0 g/dL    RDW 02.7  25.3 - 66.4 %    Platelets 155  150 - 400 K/uL   BASIC METABOLIC PANEL     Status: Abnormal   Collection Time   12/01/12  4:40 AM      Component Value Range Comment   Sodium 134 (*) 135 - 145 mEq/L    Potassium 3.6  3.5 - 5.1 mEq/L    Chloride 99  96 - 112 mEq/L    CO2 29  19 - 32 mEq/L    Glucose, Bld 123 (*) 70 - 99 mg/dL    BUN 7  6 - 23 mg/dL     Creatinine, Ser 4.03  0.50 - 1.10 mg/dL    Calcium 8.4  8.4 - 47.4 mg/dL    GFR calc non Af Amer 83 (*) >90 mL/min    GFR calc Af Amer >90  >90 mL/min     Dg Chest 2 View  12/01/2012  *RADIOLOGY REPORT*  Clinical Data:  CHEST - 2 VIEW  Comparison: 11/23/2012  Findings: Bilateral upper lobe scarring is stable.  There is some minor reticular lung base opacity likely subsegmental atelectasis. No convincing infiltrate and no edema.  The cardiac silhouette is normal in size and configuration.  No mediastinal or hilar masses or adenopathy.  No pleural effusion or pneumothorax.  IMPRESSION: No acute cardiopulmonary disease.  No change from the prior study.   Original Report Authenticated By: Amie Portland, M.D.     Review of Systems  Constitutional: Negative for fever.  HENT: Negative for congestion and sore throat.   Respiratory: Negative for cough and shortness of breath.   Cardiovascular: Negative for chest pain, palpitations, orthopnea, leg swelling and PND.  Gastrointestinal: Positive for nausea. Negative for abdominal pain, diarrhea, constipation, blood in stool and melena.  Genitourinary: Negative for dysuria and hematuria.  Neurological: Negative for dizziness.   Blood pressure 130/74, pulse 123, temperature 100.1 F (37.8 C), temperature source Oral, resp. rate 16, height 5\' 7"  (1.702 m), weight 83.462 kg (184 lb), SpO2 94.00%. Physical Exam  Constitutional: She is oriented to person, place, and time. She appears well-developed and well-nourished. No distress.  HENT:  Head: Normocephalic and atraumatic.  Eyes: EOM are normal. Pupils are equal, round, and reactive to light. No scleral icterus.  Neck: Normal range of motion. Neck supple.  Cardiovascular: Regular rhythm, S1 normal and S2 normal.  Tachycardia present.   No murmur heard. Pulses:      Radial  pulses are 2+ on the right side, and 2+ on the left side.       Dorsalis pedis pulses are 2+ on the right side, and 2+ on the left  side.       No Carotid Bruits  Respiratory: Effort normal and breath sounds normal. She has no wheezes. She has no rales.  GI: Soft. Bowel sounds are normal. She exhibits no distension. There is no tenderness.  Musculoskeletal: She exhibits no edema.  Lymphadenopathy:    She has no cervical adenopathy.  Neurological: She is alert and oriented to person, place, and time.  Skin: Skin is warm and dry.  Psychiatric: She has a normal mood and affect.    Assessment/Plan: Patient Active Hospital Problem List: Tachycardia OA (osteoarthritis) of hip (11/29/2012) Postop Acute blood loss anemia (11/30/2012)  Plan:  POD #2 : TOTAL HIP ARTHROPLASTY ANTERIOR APPROACH (Left).  HR 120.  Sinus tachycardia.  Asymptomatic.  The patient is resting comfortably.  Hr is not that far from baseline.  She was 94 BPM in the office last May.  The stress of surgery may have elevated it.  There may be a concern for PE, however, this does not appear likely given lack of symptoms.  Continue to monitor.  Low dose BB may be considered.    HAGER, BRYAN 12/01/2012, 1:23 PM    I have seen and examined the patient along with HAGER, BRYAN, PA.  I have reviewed the chart, notes and new data.  I agree with PA's note.  She has mild sinus tachycardia, which is almost certainly reactive/physiological (e.g. response to pain, anemia, hypovolemia, infection, etc.)and not related to cardiac illness. Her cardiac exam is otherwise normal, as is her ecg. She has developed a fever to 102F and the tachycardia is likely part of the same clinical syndrome. There is no obvious sign or symptom of UTI, pneumonia or DVT/PE at this point. Cardiac investigation is not warranted, but it is probably worthwhile to look for cause of fever.  Please re-consult [place] if any additional cardiac issues become apparent.  Thurmon Fair, MD, [facility] [phone] 12/01/2012, 5:22 PM

## 2012-12-01 NOTE — Progress Notes (Signed)
   Subjective: 2 Days Post-Op Procedure(s) (LRB): TOTAL HIP ARTHROPLASTY ANTERIOR APPROACH (Left) Patient reports pain as mild.   Patient seen in rounds for Dr. Lequita Halt. Patient is well, but has had some minor complaints of pain in the hip and thigh, requiring pain medications Plan is to go Home after hospital stay.  Objective: Vital signs in last 24 hours: Temp:  [98.1 F (36.7 C)-102 F (38.9 C)] 100.1 F (37.8 C) (12/13 0656) Pulse Rate:  [87-136] 123  (12/13 0656) Resp:  [16] 16  (12/13 0656) BP: (102-146)/(64-77) 130/74 mmHg (12/13 0656) SpO2:  [90 %-97 %] 94 % (12/13 0656)  Intake/Output from previous day:  Intake/Output Summary (Last 24 hours) at 12/01/12 0952 Last data filed at 12/01/12 0705  Gross per 24 hour  Intake 1894.5 ml  Output   2575 ml  Net -680.5 ml    Intake/Output this shift: Total I/O In: -  Out: 250 [Urine:250]  Labs:  [facility] 12/01/12 0440 11/30/12 0414  HGB 10.1* 11.1*    Basename 12/01/12 0440 11/30/12 0414  WBC 12.6* 7.4  RBC 3.37* 3.73*  HCT 30.2* 33.4*  PLT 155 165    Basename 12/01/12 0440 11/30/12 0414  NA 134* 135  K 3.6 3.7  CL 99 101  CO2 29 27  BUN 7 10  CREATININE 0.65 0.59  GLUCOSE 123* 123*  CALCIUM 8.4 8.1*   No results found for this basename: LABPT:2,INR:2 in the last 72 hours  EXAM General - Patient is Alert, Appropriate and Oriented Extremity - Neurovascular intact Sensation intact distally Dorsiflexion/Plantar flexion intact Dressing/Incision - clean, dry, no drainage, healing, some erythema at the proximal incision from mechanical irritation fromt he surgery - no signs of infection. Motor Function - intact, moving foot and toes well on exam.   Past Medical History  Diagnosis Date  . Seasonal allergies   . COPD (chronic obstructive pulmonary disease)     PT HAS BEEN TOLD COPD--BUT SHE DOES NOT NOTICE SOB--HX OF EXPOSURE TO CHEMICALS SUCH AS LACQUERS FOR FURNITURE REFINISHING--NEVER SMOKED  . Arthritis    . Peripheral vascular disease     MILD CAROTID DISEASE BY DOPPLER STUDY  11/13/12. -REPORT FROM [facility]    Assessment/Plan: 2 Days Post-Op Procedure(s) (LRB): TOTAL HIP ARTHROPLASTY ANTERIOR APPROACH (Left) Principal Problem:  *OA (osteoarthritis) of hip Active Problems:  Postop Acute blood loss anemia  Estimated Body mass index is 28.82 kg/(m^2) as calculated from the following:   Height as of this encounter: 5\' 7" (1.702 m).   Weight as of this encounter: 184 lb(83.462 kg). Advance diet Up with therapy Plan for discharge tomorrow Discharge home with home health Increased HR, elevated temp, slight increase in WBC. CXR and UA checked. Encouraging I.S. Dr. Lequita Halt requesting Cardiology consult. Patient has been seen by Dr. Rennis Golden in the past at [facility]. DVT Prophylaxis - Xarelto, ASA 81 mg on hold Weight Bearing As Tolerated left Leg  PERKINS, ALEXZANDREW 12/01/2012, 9:52 AM

## 2012-12-01 NOTE — Care Management Note (Signed)
    Page 1 of 2   12/01/2012     2:37:46 PM   CARE MANAGEMENT NOTE 12/01/2012  Patient:  Mary Summers, Mary Summers   Account Number:  [id number]  Date Initiated:  12/01/2012  Documentation initiated by:  Colleen Can  Subjective/Objective Assessment:   DX OSTEOARTHRITIS LEFT HIP; TOTAL HIP REPLACEMNT  Doctor's office referred her to [place] for [facility] services     Action/Plan:   CM spoke with patient and spouse. plans are for patient to return to her home in [place] where spouse will be caregiver. Pt states she will need RW and 3N1. Wants agency that doctor's office referred her to.   Anticipated DC Date:  12/02/2012   Anticipated DC Plan:  HOME W HOME HEALTH SERVICES  In-house referral  NA      DC Planning Services  CM consult      [facility] Choice  HOME HEALTH  DURABLE MEDICAL EQUIPMENT   Choice offered to / List presented to:  C-1 Patient   DME arranged  3-N-1  Levan Hurst      DME agency  [facility]     HH arranged  HH-2 PT      [facility] agency  [facility]   Status of service:  Completed, signed off Medicare Important Message given?  NO (If response is "NO", the following Medicare IM given date fields will be blank) Date Medicare IM given:   Date Additional Medicare IM given:    Discharge Disposition:    Per UR Regulation:    If discussed at Long Length of Stay Meetings, dates discussed:    Comments:  12/01/2012 Colleen Can BSN RN CCM [phone] [facility] DME rep notified of pt's DME needs and anticipated d/c date. Genevieve Norlander will provide [facility] services with start date of day after discharge.

## 2012-12-01 NOTE — Progress Notes (Signed)
Physical Therapy Treatment Patient Details Name: Mary Summers MRN: [id number] DOB: [birth year] Today's Date: 12/01/2012 Time: 5284-1324 PT Time Calculation (min): 42 min  PT Assessment / Plan / Recommendation Comments on Treatment Session       Follow Up Recommendations  Home health PT     Does the patient have the potential to tolerate intense rehabilitation     Barriers to Discharge        Equipment Recommendations  Rolling walker with 5" wheels    Recommendations for Other Services OT consult  Frequency 7X/week   Plan Discharge plan remains appropriate    Precautions / Restrictions Precautions Precautions: Fall Restrictions Weight Bearing Restrictions: No Other Position/Activity Restrictions: WBAT   Pertinent Vitals/Pain 4/10; premedicated, ice pack provided    Mobility  Bed Mobility Bed Mobility: Supine to Sit Supine to Sit: 4: Min assist Details for Bed Mobility Assistance: min cues Transfers Transfers: Sit to Stand;Stand to Sit Sit to Stand: 4: Min assist Stand to Sit: 4: Min assist Details for Transfer Assistance: cues for LE management and use of UEs Ambulation/Gait Ambulation/Gait Assistance: 4: Min assist Ambulation Distance (Feet): 61 Feet Assistive device: Rolling walker Ambulation/Gait Assistance Details: cues for posture, position from RW, stride length, and sequence Gait Pattern: Step-to pattern    Exercises Total Joint Exercises Ankle Circles/Pumps: AROM;Supine;20 reps;Both Quad Sets: AROM;Both;15 reps;Supine Gluteal Sets: AROM;Both;10 reps;Supine Heel Slides: AAROM;Supine;Left;15 reps Hip ABduction/ADduction: AAROM;Left;Supine;15 reps   PT Diagnosis:    PT Problem List:   PT Treatment Interventions:     PT Goals Acute Rehab PT Goals PT Goal Formulation: With patient Time For Goal Achievement: 12/06/12 Potential to Achieve Goals: Good Pt will go Supine/Side to Sit: with supervision PT Goal: Supine/Side to Sit - Progress:  Progressing toward goal Pt will go Sit to Supine/Side: with supervision PT Goal: Sit to Supine/Side - Progress: Progressing toward goal Pt will go Sit to Stand: with supervision PT Goal: Sit to Stand - Progress: Progressing toward goal Pt will go Stand to Sit: with supervision PT Goal: Stand to Sit - Progress: Progressing toward goal Pt will Ambulate: 51 - 150 feet;with rolling walker;with min assist PT Goal: Ambulate - Progress: Progressing toward goal Pt will Go Up / Down Stairs: 1-2 stairs;with min assist;with least restrictive assistive device Pt will Perform Home Exercise Program: with supervision, verbal cues required/provided PT Goal: Perform Home Exercise Program - Progress: Progressing toward goal  Visit Information  Last PT Received On: 12/01/12 Assistance Needed: +1    Subjective Data  Subjective: I'm doing better than yesterday but I think I might have had too much medicine Patient Stated Goal: home   Cognition  Overall Cognitive Status: Appears within functional limits for tasks assessed/performed Arousal/Alertness: Awake/alert Orientation Level: Appears intact for tasks assessed Behavior During Session: [facility] for tasks performed Cognition - Other Comments: Pt appears slower to process this am - states she feels like she has taken too much medicine    Balance     End of Session PT - End of Session Activity Tolerance: Patient tolerated treatment well Patient left: in chair;with call bell/phone within reach;with family/visitor present Nurse Communication: Mobility status   GP     Mary Summers 12/01/2012, 12:05 PM

## 2012-12-01 NOTE — Progress Notes (Signed)
Occupational Therapy Treatment Patient Details Name: Mary Summers MRN: [id number] DOB: [birth year] Today's Date: 12/01/2012 Time: 4098-1191 OT Time Calculation (min): 26 min  OT Assessment / Plan / Recommendation Comments on Treatment Session      Follow Up Recommendations  No OT follow up    Barriers to Discharge       Equipment Recommendations       Recommendations for Other Services    Frequency Min 2X/week   Plan      Precautions / Restrictions Precautions Precautions: Fall Restrictions Other Position/Activity Restrictions: WBAT   Pertinent Vitals/Pain No pain    ADL  Grooming: Performed;Supervision/safety;Wash/dry hands;Teeth care Where Assessed - Grooming: Supported standing Lower Body Bathing: Performed;Minimal assistance Where Assessed - Lower Body Bathing: Supported sit to stand Toilet Transfer: Performed;Min Pension scheme manager Method: Sit to Barista: Comfort height toilet Toileting - Architect and Hygiene: Performed;Supervision/safety Where Assessed - Engineer, mining and Hygiene: Cabin crew:  (reviewed shower sequence:  did not practice) Transfers/Ambulation Related to ADLs: ambulated to bathroom with min guard assist ADL Comments: pt did not want to perform LB dressing today but wanted to wash up as she'd had a fever.      OT Diagnosis:    OT Problem List:   OT Treatment Interventions:     OT Goals Acute Rehab OT Goals Time For Goal Achievement: 12/14/12 ADL Goals Pt Will Perform Grooming: with supervision;Standing at sink ADL Goal: Grooming - Progress: Met Pt Will Perform Lower Body Bathing: with min assist;Sit to stand from chair (min guard) ADL Goal: Lower Body Bathing - Progress: Goal set today Pt Will Transfer to Toilet: with supervision;Comfort height toilet ADL Goal: Toilet Transfer - Progress: Progressing toward goals Pt Will Perform Toileting - Hygiene:  with supervision ADL Goal: Toileting - Hygiene - Progress: Met  Visit Information  Last OT Received On: 12/01/12 Assistance Needed: +1    Subjective Data      Prior Functioning       Cognition  Overall Cognitive Status: Appears within functional limits for tasks assessed/performed Arousal/Alertness: Awake/alert Orientation Level: Appears intact for tasks assessed Behavior During Session: [facility] for tasks performed    Mobility  Shoulder Instructions Bed Mobility Bed Mobility: Supine to Sit Supine to Sit: 4: Min assist Details for Bed Mobility Assistance: min cues Transfers Sit to Stand: 5: Supervision Details for Transfer Assistance: no cues needed       Exercises      Balance     End of Session OT - End of Session Activity Tolerance: Patient tolerated treatment well Patient left: in chair;with call bell/phone within reach;with family/visitor present  GO     Mary Summers 12/01/2012, 10:23 AM Marica Otter, OTR/L [phone] 12/01/2012

## 2012-12-01 NOTE — Progress Notes (Signed)
Physical Therapy Treatment Patient Details Name: Mary Summers MRN: [id number] DOB: 24-Aug-1932 Today's Date: 12/01/2012 Time: 1525-1610 PT Time Calculation (min): 45 min  PT Assessment / Plan / Recommendation Comments on Treatment Session       Follow Up Recommendations  Home health PT     Does the patient have the potential to tolerate intense rehabilitation     Barriers to Discharge        Equipment Recommendations  Rolling walker with 5" wheels    Recommendations for Other Services OT consult  Frequency 7X/week   Plan Discharge plan remains appropriate    Precautions / Restrictions Precautions Precautions: Fall Restrictions Weight Bearing Restrictions: No Other Position/Activity Restrictions: WBAT   Pertinent Vitals/Pain 3/10; premedicated    Mobility  Bed Mobility Bed Mobility: Sit to Supine Sit to Supine: 4: Min assist Details for Bed Mobility Assistance: cues for sequence, assist with R LE Transfers Transfers: Sit to Stand;Stand to Sit Sit to Stand: 4: Min guard Stand to Sit: 4: Min guard Details for Transfer Assistance: cues for LE management and use of UEs Ambulation/Gait Ambulation/Gait Assistance: 4: Min guard Ambulation Distance (Feet): 198 Feet Assistive device: Rolling walker Ambulation/Gait Assistance Details: min cues for posture and position from RW Gait Pattern: Step-to pattern    Exercises     PT Diagnosis:    PT Problem List:   PT Treatment Interventions:     PT Goals Acute Rehab PT Goals PT Goal Formulation: With patient Time For Goal Achievement: 12/06/12 Potential to Achieve Goals: Good Pt will go Supine/Side to Sit: with supervision PT Goal: Supine/Side to Sit - Progress: Progressing toward goal Pt will go Sit to Supine/Side: with supervision PT Goal: Sit to Supine/Side - Progress: Progressing toward goal Pt will go Sit to Stand: with supervision PT Goal: Sit to Stand - Progress: Progressing toward goal Pt will go Stand to  Sit: with supervision PT Goal: Stand to Sit - Progress: Progressing toward goal Pt will Ambulate: 51 - 150 feet;with rolling walker;with min assist PT Goal: Ambulate - Progress: Progressing toward goal Pt will Go Up / Down Stairs: 1-2 stairs;with min assist;with least restrictive assistive device  Visit Information  Last PT Received On: 12/01/12 Assistance Needed: +1    Subjective Data  Patient Stated Goal: home   Cognition  Overall Cognitive Status: Appears within functional limits for tasks assessed/performed Arousal/Alertness: Awake/alert Orientation Level: Appears intact for tasks assessed Behavior During Session: [facility] for tasks performed Cognition - Other Comments: Pt appears slower to process this am - states she feels like she has taken too much medicine    Balance     End of Session PT - End of Session Activity Tolerance: Patient tolerated treatment well Patient left: in bed;with call bell/phone within reach;with family/visitor present Nurse Communication: Mobility status   GP     Jabier Deese 12/01/2012, 4:22 PM

## 2012-12-02 LAB — CBC
Platelets: 172 10*3/uL (ref 150–400)
RBC: 3.31 MIL/uL — ABNORMAL LOW (ref 3.87–5.11)
RDW: 12.3 % (ref 11.5–15.5)
WBC: 12 10*3/uL — ABNORMAL HIGH (ref 4.0–10.5)

## 2012-12-02 MED ORDER — OXYCODONE HCL 5 MG PO TABS: 5.0000 mg | ORAL_TABLET | ORAL | Status: DC | PRN

## 2012-12-02 MED ORDER — METHOCARBAMOL 500 MG PO TABS: 500.0000 mg | ORAL_TABLET | Freq: Four times a day (QID) | ORAL | Status: DC | PRN

## 2012-12-02 MED ORDER — RIVAROXABAN 10 MG PO TABS: 10.0000 mg | ORAL_TABLET | Freq: Every day | ORAL | Status: DC

## 2012-12-02 MED ORDER — TRAMADOL HCL 50 MG PO TABS: 50.0000 mg | ORAL_TABLET | Freq: Four times a day (QID) | ORAL | Status: DC | PRN

## 2012-12-02 NOTE — Progress Notes (Signed)
   Subjective: 3 Days Post-Op Procedure(s) (LRB): TOTAL HIP ARTHROPLASTY ANTERIOR APPROACH (Left) Patient reports pain as mild.   Patient is doing much better today. No further fevers. asymptomatic from tachycardia Plan is to go Home after hospital stay.  Objective: Vital signs in last 24 hours: Temp:  [98.2 F (36.8 C)-99.8 F (37.7 C)] 98.4 F (36.9 C) (12/14 0522) Pulse Rate:  [112-127] 123  (12/14 0522) Resp:  [18] 18  (12/14 0522) BP: (110-137)/(67-77) 137/77 mmHg (12/14 0522) SpO2:  [92 %-95 %] 92 % (12/14 0522)  Intake/Output from previous day:  Intake/Output Summary (Last 24 hours) at 12/02/12 0827 Last data filed at 12/02/12 0430  Gross per 24 hour  Intake    720 ml  Output    850 ml  Net   -130 ml    Intake/Output this shift:    Labs:  Basename 12/02/12 0446 12/01/12 0440 11/30/12 0414  HGB 9.8* 10.1* 11.1*    Basename 12/02/12 0446 12/01/12 0440  WBC 12.0* 12.6*  RBC 3.31* 3.37*  HCT 29.5* 30.2*  PLT 172 155    Basename 12/01/12 0440 11/30/12 0414  NA 134* 135  K 3.6 3.7  CL 99 101  CO2 29 27  BUN 7 10  CREATININE 0.65 0.59  GLUCOSE 123* 123*  CALCIUM 8.4 8.1*   No results found for this basename: LABPT:2,INR:2 in the last 72 hours  EXAM General - Patient is Alert, Appropriate and Oriented Extremity - Neurologically intact Neurovascular intact Dressing/Incision - clean, dry, no drainage Motor Function - intact, moving foot and toes well on exam.   Past Medical History  Diagnosis Date  . Seasonal allergies   . COPD (chronic obstructive pulmonary disease)     PT HAS BEEN TOLD COPD--BUT SHE DOES NOT NOTICE SOB--HX OF EXPOSURE TO CHEMICALS SUCH AS LACQUERS FOR FURNITURE REFINISHING--NEVER SMOKED  . Arthritis   . Peripheral vascular disease     MILD CAROTID DISEASE BY DOPPLER STUDY  11/13/12. -REPORT FROM [facility]    Assessment/Plan: 3 Days Post-Op Procedure(s) (LRB): TOTAL HIP ARTHROPLASTY ANTERIOR  APPROACH (Left) Principal Problem:  *OA (osteoarthritis) of hip Active Problems:  Postop Acute blood loss anemia Appreciate cardiology consult regarding tachycardia. They felt that it was not an acute event and recommended observation.  Discharge home with home health  DVT Prophylaxis - Xarelto Weight Bearing As Tolerated left Leg  Tocarra Gassen V 12/02/2012, 8:27 AM

## 2012-12-02 NOTE — Progress Notes (Signed)
Utilization review completed.  

## 2012-12-02 NOTE — Progress Notes (Signed)
Physical Therapy Treatment Patient Details Name: Mary Summers MRN: [id number] DOB: [birth year] Today's Date: 12/02/2012 Time: 0822-0910 PT Time Calculation (min): 48 min  PT Assessment / Plan / Recommendation Comments on Treatment Session  Marked improvement in all tasks esp bed transfers    Follow Up Recommendations  Home health PT     Does the patient have the potential to tolerate intense rehabilitation     Barriers to Discharge        Equipment Recommendations  Rolling walker with 5" wheels    Recommendations for Other Services OT consult  Frequency 7X/week   Plan Discharge plan remains appropriate    Precautions / Restrictions Precautions Precautions: Fall Restrictions Weight Bearing Restrictions: No Other Position/Activity Restrictions: WBAT   Pertinent Vitals/Pain 3/10; premedicated    Mobility  Bed Mobility Bed Mobility: Supine to Sit;Sit to Supine Supine to Sit: 4: Min guard Sit to Supine: 4: Min guard Details for Bed Mobility Assistance: cues for sequence, assist with R LE Transfers Transfers: Sit to Stand;Stand to Sit Sit to Stand: 5: Supervision Stand to Sit: 5: Supervision Details for Transfer Assistance: cues for LE management and use of UEs Ambulation/Gait Ambulation/Gait Assistance: 5: Supervision Ambulation Distance (Feet): 150 Feet (2x 22') Assistive device: Rolling walker Ambulation/Gait Assistance Details: min cues for posture and position from RW Gait Pattern: Step-to pattern;Step-through pattern General Gait Details: Progressed to recip gait Stairs: Yes Stairs Assistance: 4: Min assist Stairs Assistance Details (indicate cue type and reason): cues for sequence and RW/foot placement Stair Management Technique: No rails;Forwards;With walker;Step to pattern Number of Stairs: 1  (twice)    Exercises Total Joint Exercises Ankle Circles/Pumps: AROM;Supine;20 reps;Both Quad Sets: AROM;Both;15 reps;Supine Gluteal Sets: AROM;Both;Supine;20  reps Heel Slides: AAROM;Supine;Left;20 reps Hip ABduction/ADduction: AAROM;Left;Supine;20 reps   PT Diagnosis:    PT Problem List:   PT Treatment Interventions:     PT Goals Acute Rehab PT Goals PT Goal Formulation: With patient Time For Goal Achievement: 12/06/12 Potential to Achieve Goals: Good Pt will go Supine/Side to Sit: with supervision PT Goal: Supine/Side to Sit - Progress: Progressing toward goal Pt will go Sit to Supine/Side: with supervision PT Goal: Sit to Supine/Side - Progress: Progressing toward goal Pt will go Sit to Stand: with supervision PT Goal: Sit to Stand - Progress: Met Pt will go Stand to Sit: with supervision PT Goal: Stand to Sit - Progress: Met Pt will Ambulate: 51 - 150 feet;with rolling walker;with min assist PT Goal: Ambulate - Progress: Met Pt will Go Up / Down Stairs: 1-2 stairs;with min assist;with least restrictive assistive device PT Goal: Up/Down Stairs - Progress: Met Pt will Perform Home Exercise Program: with supervision, verbal cues required/provided PT Goal: Perform Home Exercise Program - Progress: Progressing toward goal  Visit Information  Last PT Received On: 12/02/12 Assistance Needed: +1    Subjective Data  Subjective: I'm gong home todaye Patient Stated Goal: home   Cognition  Overall Cognitive Status: Appears within functional limits for tasks assessed/performed Arousal/Alertness: Awake/alert Orientation Level: Appears intact for tasks assessed Behavior During Session: [facility] for tasks performed Cognition - Other Comments: Pt appears slower to process this am - states she feels like she has taken too much medicine    Balance     End of Session PT - End of Session Activity Tolerance: Patient tolerated treatment well Patient left: in chair;with call bell/phone within reach Nurse Communication: Mobility status   GP     Mary Summers 12/02/2012, 12:47 PM

## 2012-12-03 DIAGNOSIS — Z471 Aftercare following joint replacement surgery: Secondary | ICD-10-CM | POA: Diagnosis not present

## 2012-12-03 DIAGNOSIS — IMO0001 Reserved for inherently not codable concepts without codable children: Secondary | ICD-10-CM | POA: Diagnosis not present

## 2012-12-03 DIAGNOSIS — Z7901 Long term (current) use of anticoagulants: Secondary | ICD-10-CM | POA: Diagnosis not present

## 2012-12-03 DIAGNOSIS — Z96649 Presence of unspecified artificial hip joint: Secondary | ICD-10-CM | POA: Diagnosis not present

## 2012-12-03 DIAGNOSIS — Z4801 Encounter for change or removal of surgical wound dressing: Secondary | ICD-10-CM | POA: Diagnosis not present

## 2012-12-04 ENCOUNTER — Encounter (HOSPITAL_COMMUNITY): Payer: Self-pay | Admitting: Orthopedic Surgery

## 2012-12-05 DIAGNOSIS — Z471 Aftercare following joint replacement surgery: Secondary | ICD-10-CM | POA: Diagnosis not present

## 2012-12-05 DIAGNOSIS — Z4801 Encounter for change or removal of surgical wound dressing: Secondary | ICD-10-CM | POA: Diagnosis not present

## 2012-12-05 DIAGNOSIS — Z96649 Presence of unspecified artificial hip joint: Secondary | ICD-10-CM | POA: Diagnosis not present

## 2012-12-05 DIAGNOSIS — Z7901 Long term (current) use of anticoagulants: Secondary | ICD-10-CM | POA: Diagnosis not present

## 2012-12-05 DIAGNOSIS — IMO0001 Reserved for inherently not codable concepts without codable children: Secondary | ICD-10-CM | POA: Diagnosis not present

## 2012-12-07 DIAGNOSIS — Z96649 Presence of unspecified artificial hip joint: Secondary | ICD-10-CM | POA: Diagnosis not present

## 2012-12-07 DIAGNOSIS — Z471 Aftercare following joint replacement surgery: Secondary | ICD-10-CM | POA: Diagnosis not present

## 2012-12-07 DIAGNOSIS — Z7901 Long term (current) use of anticoagulants: Secondary | ICD-10-CM | POA: Diagnosis not present

## 2012-12-07 DIAGNOSIS — IMO0001 Reserved for inherently not codable concepts without codable children: Secondary | ICD-10-CM | POA: Diagnosis not present

## 2012-12-07 DIAGNOSIS — Z4801 Encounter for change or removal of surgical wound dressing: Secondary | ICD-10-CM | POA: Diagnosis not present

## 2012-12-11 DIAGNOSIS — R000 Tachycardia, unspecified: Secondary | ICD-10-CM

## 2012-12-11 DIAGNOSIS — E871 Hypo-osmolality and hyponatremia: Secondary | ICD-10-CM

## 2012-12-11 NOTE — Discharge Summary (Signed)
Physician Discharge Summary   Patient ID: SILVANA HOLECEK MRN: [id number] DOB/AGE: [id number] 76 y.o.  Admit date: 11/29/2012 Discharge date: 12/02/2012  Primary Diagnosis: Osteoarthritis, left hip.   Admission Diagnoses:  Past Medical History  Diagnosis Date  . Seasonal allergies   . COPD (chronic obstructive pulmonary disease)     PT HAS BEEN TOLD COPD--BUT SHE DOES NOT NOTICE SOB--HX OF EXPOSURE TO CHEMICALS SUCH AS LACQUERS FOR FURNITURE REFINISHING--NEVER SMOKED  . Arthritis   . Peripheral vascular disease     MILD CAROTID DISEASE BY DOPPLER STUDY  11/13/12. -REPORT FROM DR. [facility]   Discharge Diagnoses:   Principal Problem:  *OA (osteoarthritis) of hip Active Problems:  Postop Acute blood loss anemia  Postop Hyponatremia  Postop Tachycardia  Estimated Body mass index is 28.82 kg/(m^2) as calculated from the following:   Height as of this encounter: 5\' 7" (1.702 m).   Weight as of this encounter: 184 lb(83.462 kg).  Classification of overweight in adults according to BMI (WHO, 1998)   Procedure: Procedure(s) (LRB): TOTAL HIP ARTHROPLASTY ANTERIOR APPROACH (Left)   Consults: cardiology  HPI: Ms. Lance Coon is a 79 year old female with end-stage  arthritis of the left hip with progressively worsening pain and  dysfunction. She has bone-on-bone arthritis with subchondral cyst  formation. She presents now for left total hip arthroplasty.  Laboratory Data: Admission on 11/29/2012, Discharged on 12/02/2012  Component Date Value Range Status  . WBC 11/30/2012 7.4  4.0 - 10.5 K/uL Final  . RBC 11/30/2012 3.73* 3.87 - 5.11 MIL/uL Final  . Hemoglobin 11/30/2012 11.1* 12.0 - 15.0 g/dL Final  . HCT 57/84/6962 33.4* 36.0 - 46.0 % Final  . MCV 11/30/2012 89.5  78.0 - 100.0 fL Final  . MCH 11/30/2012 29.8  26.0 - 34.0 pg Final  . MCHC 11/30/2012 33.2  30.0 - 36.0 g/dL Final  . RDW 95/28/4132 12.3  11.5 - 15.5 % Final  . Platelets  11/30/2012 165  150 - 400 K/uL Final  . Sodium 11/30/2012 135  135 - 145 mEq/L Final  . Potassium 11/30/2012 3.7  3.5 - 5.1 mEq/L Final  . Chloride 11/30/2012 101  96 - 112 mEq/L Final  . CO2 11/30/2012 27  19 - 32 mEq/L Final  . Glucose, Bld 11/30/2012 123* 70 - 99 mg/dL Final  . BUN 44/12/270 10  6 - 23 mg/dL Final  . Creatinine, Ser 11/30/2012 0.59  0.50 - 1.10 mg/dL Final  . Calcium 53/66/4403 8.1* 8.4 - 10.5 mg/dL Final  . GFR calc non Af Amer 11/30/2012 86* >90 mL/min Final  . GFR calc Af Amer 11/30/2012 >90  >90 mL/min Final   Comment:                                 The eGFR has been calculated                          using the CKD EPI equation.                          This calculation has not been                          validated in all clinical  situations.                          eGFR's persistently                          <90 mL/min signify                          possible Chronic Kidney Disease.  . WBC 12/01/2012 12.6* 4.0 - 10.5 K/uL Final  . RBC 12/01/2012 3.37* 3.87 - 5.11 MIL/uL Final  . Hemoglobin 12/01/2012 10.1* 12.0 - 15.0 g/dL Final  . HCT 96/03/5408 30.2* 36.0 - 46.0 % Final  . MCV 12/01/2012 89.6  78.0 - 100.0 fL Final  . MCH 12/01/2012 30.0  26.0 - 34.0 pg Final  . MCHC 12/01/2012 33.4  30.0 - 36.0 g/dL Final  . RDW 81/19/1478 12.3  11.5 - 15.5 % Final  . Platelets 12/01/2012 155  150 - 400 K/uL Final  . Sodium 12/01/2012 134* 135 - 145 mEq/L Final  . Potassium 12/01/2012 3.6  3.5 - 5.1 mEq/L Final  . Chloride 12/01/2012 99  96 - 112 mEq/L Final  . CO2 12/01/2012 29  19 - 32 mEq/L Final  . Glucose, Bld 12/01/2012 123* 70 - 99 mg/dL Final  . BUN 29/56/2130 7  6 - 23 mg/dL Final  . Creatinine, Ser 12/01/2012 0.65  0.50 - 1.10 mg/dL Final  . Calcium 86/57/8469 8.4  8.4 - 10.5 mg/dL Final  . GFR calc non Af Amer 12/01/2012 83* >90 mL/min Final  . GFR calc Af Amer 12/01/2012 >90  >90 mL/min Final   Comment:                                  The eGFR has been calculated                          using the CKD EPI equation.                          This calculation has not been                          validated in all clinical                          situations.                          eGFR's persistently                          <90 mL/min signify                          possible Chronic Kidney Disease.  . Color, Urine 12/01/2012 YELLOW  YELLOW Final  . APPearance 12/01/2012 CLEAR  CLEAR Final  . Specific Gravity, Urine 12/01/2012 1.014  1.005 - 1.030 Final  . pH 12/01/2012 6.5  5.0 - 8.0 Final  . Glucose, UA 12/01/2012 NEGATIVE  NEGATIVE mg/dL Final  . Hgb urine dipstick 12/01/2012 NEGATIVE  NEGATIVE Final  . Bilirubin Urine 12/01/2012 NEGATIVE  NEGATIVE Final  .  Ketones, ur 12/01/2012 NEGATIVE  NEGATIVE mg/dL Final  . Protein, ur 16/09/9603 NEGATIVE  NEGATIVE mg/dL Final  . Urobilinogen, UA 12/01/2012 0.2  0.0 - 1.0 mg/dL Final  . Nitrite 54/08/8118 NEGATIVE  NEGATIVE Final  . Leukocytes, UA 12/01/2012 NEGATIVE  NEGATIVE Final   MICROSCOPIC NOT DONE ON URINES WITH NEGATIVE PROTEIN, BLOOD, LEUKOCYTES, NITRITE, OR GLUCOSE <1000 mg/dL.  . WBC 12/02/2012 12.0* 4.0 - 10.5 K/uL Final  . RBC 12/02/2012 3.31* 3.87 - 5.11 MIL/uL Final  . Hemoglobin 12/02/2012 9.8* 12.0 - 15.0 g/dL Final  . HCT 14/78/2956 29.5* 36.0 - 46.0 % Final  . MCV 12/02/2012 89.1  78.0 - 100.0 fL Final  . MCH 12/02/2012 29.6  26.0 - 34.0 pg Final  . MCHC 12/02/2012 33.2  30.0 - 36.0 g/dL Final  . RDW 21/30/8657 12.3  11.5 - 15.5 % Final  . Platelets 12/02/2012 172  150 - 400 K/uL Final  Hospital Outpatient Visit on 11/23/2012  Component Date Value Range Status  . MRSA, PCR 11/23/2012 NEGATIVE  NEGATIVE Final  . Staphylococcus aureus 11/23/2012 NEGATIVE  NEGATIVE Final   Comment:                                 The Xpert SA Assay (FDA                          approved for NASAL specimens                          in patients over 32  years of age),                          is one component of                          a comprehensive surveillance                          program.  Test performance has                          been validated by [employer] for patients greater                          than or equal to 23 year old.                          It is not intended                          to diagnose infection nor to                          guide or monitor treatment.  Marland Kitchen aPTT 11/23/2012 26  24 - 37 seconds Final   Comment: SLIGHT HEMOLYSIS                          SPECIMEN CHECKED FOR  CLOTS  . WBC 11/23/2012 5.6  4.0 - 10.5 K/uL Final  . RBC 11/23/2012 4.85  3.87 - 5.11 MIL/uL Final  . Hemoglobin 11/23/2012 14.3  12.0 - 15.0 g/dL Final  . HCT 16/09/9603 42.9  36.0 - 46.0 % Final  . MCV 11/23/2012 88.5  78.0 - 100.0 fL Final  . MCH 11/23/2012 29.5  26.0 - 34.0 pg Final  . MCHC 11/23/2012 33.3  30.0 - 36.0 g/dL Final  . RDW 54/08/8118 12.2  11.5 - 15.5 % Final  . Platelets 11/23/2012 235  150 - 400 K/uL Final  . Prothrombin Time 11/23/2012 12.1  11.6 - 15.2 seconds Final   Comment: SLIGHT HEMOLYSIS                          SPECIMEN CHECKED FOR CLOTS  . INR 11/23/2012 0.90  0.00 - 1.49 Final  . ABO/RH(D) 11/23/2012 O NEG   Final  . Antibody Screen 11/23/2012 NEG   Final  . Sample Expiration 11/23/2012 12/02/2012   Final  . Color, Urine 11/23/2012 YELLOW  YELLOW Final  . APPearance 11/23/2012 CLEAR  CLEAR Final  . Specific Gravity, Urine 11/23/2012 1.010  1.005 - 1.030 Final  . pH 11/23/2012 7.5  5.0 - 8.0 Final  . Glucose, UA 11/23/2012 NEGATIVE  NEGATIVE mg/dL Final  . Hgb urine dipstick 11/23/2012 NEGATIVE  NEGATIVE Final  . Bilirubin Urine 11/23/2012 NEGATIVE  NEGATIVE Final  . Ketones, ur 11/23/2012 NEGATIVE  NEGATIVE mg/dL Final  . Protein, ur 14/78/2956 NEGATIVE  NEGATIVE mg/dL Final  . Urobilinogen, UA 11/23/2012 0.2  0.0 - 1.0 mg/dL Final  . Nitrite 21/30/8657 NEGATIVE   NEGATIVE Final  . Leukocytes, UA 11/23/2012 NEGATIVE  NEGATIVE Final   MICROSCOPIC NOT DONE ON URINES WITH NEGATIVE PROTEIN, BLOOD, LEUKOCYTES, NITRITE, OR GLUCOSE <1000 mg/dL.  . ABO/RH(D) 11/23/2012 O NEG   Final  Office Visit on 11/20/2012  Component Date Value Range Status  . Sodium 11/20/2012 142  135 - 145 mEq/L Final  . Potassium 11/20/2012 4.6  3.5 - 5.1 mEq/L Final  . Chloride 11/20/2012 106  96 - 112 mEq/L Final  . CO2 11/20/2012 29  19 - 32 mEq/L Final  . Glucose, Bld 11/20/2012 102* 70 - 99 mg/dL Final  . BUN 84/69/6295 12  6 - 23 mg/dL Final  . Creatinine, Ser 11/20/2012 0.6  0.4 - 1.2 mg/dL Final  . Calcium 28/41/3244 8.9  8.4 - 10.5 mg/dL Final  . GFR 12/22/7251 111.14  >60.00 mL/min Final  . Cholesterol 11/20/2012 148  0 - 200 mg/dL Final   ATP III Classification       Desirable:  < 200 mg/dL               Borderline High:  200 - 239 mg/dL          High:  > = 664 mg/dL  . Triglycerides 11/20/2012 80.0  0.0 - 149.0 mg/dL Final   Normal:  <403 mg/dLBorderline High:  150 - 199 mg/dL  . HDL 11/20/2012 53.80  >39.00 mg/dL Final  . VLDL 47/42/5956 16.0  0.0 - 40.0 mg/dL Final  . LDL Cholesterol 11/20/2012 78  0 - 99 mg/dL Final  . Total CHOL/HDL Ratio 11/20/2012 3   Final                  Men          Women1/2 Average Risk     3.4  3.3Average Risk          5.0          4.42X Average Risk          9.6          7.13X Average Risk          15.0          11.0                      . Total Bilirubin 11/20/2012 0.7  0.3 - 1.2 mg/dL Final  . Bilirubin, Direct 11/20/2012 0.1  0.0 - 0.3 mg/dL Final  . Alkaline Phosphatase 11/20/2012 67  39 - 117 U/L Final  . AST 11/20/2012 27  0 - 37 U/L Final  . ALT 11/20/2012 25  0 - 35 U/L Final  . Total Protein 11/20/2012 6.7  6.0 - 8.3 g/dL Final  . Albumin 16/09/9603 4.1  3.5 - 5.2 g/dL Final     X-Rays:Dg Chest 2 View  12/01/2012  *RADIOLOGY REPORT*  Clinical Data:  CHEST - 2 VIEW  Comparison: 11/23/2012  Findings: Bilateral upper  lobe scarring is stable.  There is some minor reticular lung base opacity likely subsegmental atelectasis. No convincing infiltrate and no edema.  The cardiac silhouette is normal in size and configuration.  No mediastinal or hilar masses or adenopathy.  No pleural effusion or pneumothorax.  IMPRESSION: No acute cardiopulmonary disease.  No change from the prior study.   Original Report Authenticated By: Amie Portland, M.D.    Dg Chest 2 View  11/23/2012  *RADIOLOGY REPORT*  Clinical Data: Preoperative assessment for left total hip replacement, history COPD and prior abnormal chest x-ray  CHEST - 2 VIEW  Comparison: 01/17/2009  Findings: Normal heart size, mediastinal contours, pulmonary vascularity. Superior retraction of the hila consistent with bilateral upper lobe volume loss. Patchy density is seen at the lateral aspects of the upper lobes bilaterally, right greater than left, suspect scarring. These changes appear stable since the previous exam. Underlying emphysematous and minimal bronchitic changes. No definite acute infiltrate, pleural effusion or pneumothorax. Bones appear demineralized.  IMPRESSION: Bilateral upper lobe volume loss and scarring, greater on right, not significantly changed since 2010. No acute abnormalities.   Original Report Authenticated By: Ulyses Southward, M.D.    Dg Hip Complete Left  11/29/2012  *RADIOLOGY REPORT*  Clinical Data: Left total hip replacement.  DG C-ARM 61-120 MIN - NRPT [facility], LEFT HIP - COMPLETE 2+ VIEW  Comparison: Plain films 11/23/2012.  Findings: Two fluoroscopic intraoperative spot views of the left hip are provided.  Images demonstrate placement of total hip replacement.  The device is located.  No fracture or other complicating feature is identified.  IMPRESSION: Left total hip replacement.   Original Report Authenticated By: Holley Dexter, M.D.    Dg Hip Complete Left  11/23/2012  *RADIOLOGY REPORT*  Clinical Data: Preoperative assessment for left total  hip replacement  LEFT HIP - COMPLETE 2+ VIEW  Comparison: 11/28/2009  Findings: Mild osseous demineralization. Degenerative changes bilateral hip joints with joint space narrowing and circumferential spur formation greater on left. SI joints symmetric. No acute fracture, dislocation, or bone destruction. Degenerative disc disease changes lower lumbar spine with levoconvex scoliosis.  IMPRESSION: Osteoarthritic changes of bilateral hip joints greater on left. Degenerative disc disease changes lower lumbar spine.   Original Report Authenticated By: Ulyses Southward, M.D.    Dg Pelvis Portable  11/29/2012  *RADIOLOGY REPORT*  Clinical Data: Postop left  total hip arthroplasty.  PORTABLE PELVIS AP VIEW 11/29/2012 1121 hours:  Comparison: Intraoperative left hip x-rays earlier same date 1030 hours.  Findings: Left total hip arthroplasty with anatomic alignment in the AP projection.  Surgical drain in place.  No acute complicating features.  Moderate axial joint space narrowing in the contralateral right hip.  IMPRESSION: Left total hip arthroplasty with anatomic alignment in the AP projection and no acute complicating features.   Original Report Authenticated By: Hulan Saas, M.D.    Dg C-arm 61-120 Min-no Report  11/29/2012  *RADIOLOGY REPORT*  Clinical Data: Left total hip replacement.  DG C-ARM 61-120 MIN - NRPT [facility], LEFT HIP - COMPLETE 2+ VIEW  Comparison: Plain films 11/23/2012.  Findings: Two fluoroscopic intraoperative spot views of the left hip are provided.  Images demonstrate placement of total hip replacement.  The device is located.  No fracture or other complicating feature is identified.  IMPRESSION: Left total hip replacement.   Original Report Authenticated By: Holley Dexter, M.D.     EKG: Orders placed during the hospital encounter of 11/29/12  . EKG 12-LEAD  . EKG 12-LEAD  . EKG  . EKG     Hospital Course: Patient was admitted to [facility] and taken to the OR and underwent  the above state procedure without complications.  Patient tolerated the procedure well and was later transferred to the recovery room and then to the orthopaedic floor for postoperative care.  They were given PO and IV analgesics for pain control following their surgery.  They were given 24 hours of postoperative antibiotics of      Anti-infectives     Start     Dose/Rate Route Frequency Ordered Stop   11/29/12 1500   ceFAZolin (ANCEF) IVPB 1 g/50 mL premix        1 g 100 mL/hr over 30 Minutes Intravenous Every 6 hours 11/29/12 1249 11/29/12 2142   11/29/12 0704   ceFAZolin (ANCEF) IVPB 2 g/50 mL premix        2 g 100 mL/hr over 30 Minutes Intravenous 60 min pre-op 11/29/12 0704 11/29/12 0850         and started on DVT prophylaxis in the form of Xarelto.   PT and OT were ordered for total hip protocol.  The patient was allowed to be WBAT with therapy. Discharge planning was consulted to help with postop disposition and equipment needs.  Patient had a rough night on the evening of surgery and started to get up OOB with therapy on day one walking over 75 feet twice.  Hemovac drain was pulled without difficulty.  The knee immobilizer was removed and discontinued.  Continued to work with therapy into day two walking about 60 feet.  Dressing was changed on day two and the incision was healing well.  Noted to have increased HR, elevated temp, slight increase in WBC. CXR and UA checked. Encouraging I.S. Dr. Lequita Halt requesting Cardiology consult. Patient has been seen by Dr. Rennis Golden in the past at [facility].  Antony Blackbird by Cardiology. She has mild sinus tachycardia, which is almost certainly reactive/physiological (e.g. response to pain, anemia, hypovolemia, infection, etc.)and not related to cardiac illness. Her cardiac exam is otherwise normal, as is her ecg. She has developed a fever to 102F and the tachycardia is likely part of the same clinical syndrome. There is no obvious sign or symptom of UTI, pneumonia or  DVT/PE at this point. Cardiac investigation is not warranted, but it is probably worthwhile to look  for cause of fever. CHEST - 2 VIEW  Findings: Bilateral upper lobe scarring is stable. There is some minor reticular lung base opacity likely subsegmental atelectasis. No convincing infiltrate and no edema.The cardiac silhouette is normal in size and configuration. No mediastinal or hilar masses or adenopathy.No pleural effusion or pneumothorax. IMPRESSION: No acute cardiopulmonary disease. No change from the prior study. UA was negative. By day three, patient was doing much better. No further fevers. Asymptomatic from tachycardia. The patient had progressed with therapy and meeting their goals.  Incision was healing well.  Patient was seen in rounds and was ready to go home.  Discharge Medications: Prior to Admission medications   Medication Sig Start Date End Date Taking? Authorizing Provider  atorvastatin (LIPITOR) 40 MG tablet Take 40 mg by mouth daily before breakfast.    Yes Historical Provider, MD  fluticasone (FLONASE) 50 MCG/ACT nasal spray Place 1 spray into the nose. ONCE A DAY IF NEEDED   Yes Historical Provider, MD  Multiple Vitamin (MULTIVITAMIN) tablet Take 1 tablet by mouth daily.    Yes Historical Provider, MD  methocarbamol (ROBAXIN) 500 MG tablet Take 1 tablet (500 mg total) by mouth every 6 (six) hours as needed. 12/02/12   Loanne Drilling, MD  oxyCODONE (OXY IR/ROXICODONE) 5 MG immediate release tablet Take 1-2 tablets (5-10 mg total) by mouth every 3 (three) hours as needed. 12/02/12   Loanne Drilling, MD  rivaroxaban (XARELTO) 10 MG TABS tablet Take 1 tablet (10 mg total) by mouth daily with breakfast. 12/02/12   Loanne Drilling, MD  traMADol (ULTRAM) 50 MG tablet Take 1-2 tablets (50-100 mg total) by mouth every 6 (six) hours as needed. 12/02/12   Loanne Drilling, MD    Diet: Regular diet Activity:WBAT No bending hip over 90 degrees- A "L" Angle Do not cross legs Do  not let foot roll inward When turning these patients a pillow should be placed between the patient's legs to prevent crossing. Patients should have the affected knee fully extended when trying to sit or stand from all surfaces to prevent excessive hip flexion. When ambulating and turning toward the affected side the affected leg should have the toes turned out prior to moving the walker and the rest of patient's body as to prevent internal rotation/ turning in of the leg. Abduction pillows are the most effective way to prevent a patient from not crossing legs or turning toes in at rest. If an abduction pillow is not ordered placing a regular pillow length wise between the patient's legs is also an effective reminder. It is imperative that these precautions be maintained so that the surgical hip does not dislocate. Follow-up:in 2 weeks Disposition - Home Discharged Condition: good      Medication List     As of 12/11/2012  9:17 AM    STOP taking these medications         aspirin 81 MG tablet      B-complex with vitamin C tablet      beta carotene 15 MG capsule      Calcium 600+D 600-400 MG-UNIT per tablet   Generic drug: Calcium Carbonate-Vitamin D      Co Q 10 100 MG Caps      glucosamine-chondroitin 500-400 MG tablet      naproxen sodium 220 MG tablet   Commonly known as: ANAPROX      OVER THE COUNTER MEDICATION      vitamin C 500 MG tablet   Commonly  known as: ASCORBIC ACID      vitamin E 400 UNIT capsule      TAKE these medications         atorvastatin 40 MG tablet   Commonly known as: LIPITOR   Take 40 mg by mouth daily before breakfast.      fluticasone 50 MCG/ACT nasal spray   Commonly known as: FLONASE   Place 1 spray into the nose. ONCE A DAY IF NEEDED      methocarbamol 500 MG tablet   Commonly known as: ROBAXIN   Take 1 tablet (500 mg total) by mouth every 6 (six) hours as needed.      multivitamin tablet   Take 1 tablet by mouth daily.       oxyCODONE 5 MG immediate release tablet   Commonly known as: Oxy IR/ROXICODONE   Take 1-2 tablets (5-10 mg total) by mouth every 3 (three) hours as needed.      rivaroxaban 10 MG Tabs tablet   Commonly known as: XARELTO   Take 1 tablet (10 mg total) by mouth daily with breakfast.      traMADol 50 MG tablet   Commonly known as: ULTRAM   Take 1-2 tablets (50-100 mg total) by mouth every 6 (six) hours as needed.        Follow-up Information    Follow up with Loanne Drilling, MD. Schedule an appointment as soon as possible for a visit on 12/11/2012. (Call [phone] Monday to make the appointment)    Contact information:   [address] [place] [address] [phone]          Signed: Patrica Duel 12/11/2012, 9:17 AM

## 2012-12-12 DIAGNOSIS — Z4801 Encounter for change or removal of surgical wound dressing: Secondary | ICD-10-CM | POA: Diagnosis not present

## 2012-12-12 DIAGNOSIS — IMO0001 Reserved for inherently not codable concepts without codable children: Secondary | ICD-10-CM | POA: Diagnosis not present

## 2012-12-12 DIAGNOSIS — Z471 Aftercare following joint replacement surgery: Secondary | ICD-10-CM | POA: Diagnosis not present

## 2012-12-12 DIAGNOSIS — Z7901 Long term (current) use of anticoagulants: Secondary | ICD-10-CM | POA: Diagnosis not present

## 2012-12-12 DIAGNOSIS — Z96649 Presence of unspecified artificial hip joint: Secondary | ICD-10-CM | POA: Diagnosis not present

## 2012-12-14 DIAGNOSIS — Z471 Aftercare following joint replacement surgery: Secondary | ICD-10-CM | POA: Diagnosis not present

## 2012-12-14 DIAGNOSIS — Z96649 Presence of unspecified artificial hip joint: Secondary | ICD-10-CM | POA: Diagnosis not present

## 2012-12-14 DIAGNOSIS — Z7901 Long term (current) use of anticoagulants: Secondary | ICD-10-CM | POA: Diagnosis not present

## 2012-12-14 DIAGNOSIS — IMO0001 Reserved for inherently not codable concepts without codable children: Secondary | ICD-10-CM | POA: Diagnosis not present

## 2012-12-14 DIAGNOSIS — Z4801 Encounter for change or removal of surgical wound dressing: Secondary | ICD-10-CM | POA: Diagnosis not present

## 2012-12-15 DIAGNOSIS — Z7901 Long term (current) use of anticoagulants: Secondary | ICD-10-CM | POA: Diagnosis not present

## 2012-12-15 DIAGNOSIS — Z471 Aftercare following joint replacement surgery: Secondary | ICD-10-CM | POA: Diagnosis not present

## 2012-12-15 DIAGNOSIS — Z96649 Presence of unspecified artificial hip joint: Secondary | ICD-10-CM | POA: Diagnosis not present

## 2012-12-15 DIAGNOSIS — IMO0001 Reserved for inherently not codable concepts without codable children: Secondary | ICD-10-CM | POA: Diagnosis not present

## 2012-12-15 DIAGNOSIS — Z4801 Encounter for change or removal of surgical wound dressing: Secondary | ICD-10-CM | POA: Diagnosis not present

## 2012-12-18 DIAGNOSIS — Z96649 Presence of unspecified artificial hip joint: Secondary | ICD-10-CM | POA: Diagnosis not present

## 2012-12-18 DIAGNOSIS — Z4801 Encounter for change or removal of surgical wound dressing: Secondary | ICD-10-CM | POA: Diagnosis not present

## 2012-12-18 DIAGNOSIS — Z7901 Long term (current) use of anticoagulants: Secondary | ICD-10-CM | POA: Diagnosis not present

## 2012-12-18 DIAGNOSIS — Z471 Aftercare following joint replacement surgery: Secondary | ICD-10-CM | POA: Diagnosis not present

## 2012-12-18 DIAGNOSIS — IMO0001 Reserved for inherently not codable concepts without codable children: Secondary | ICD-10-CM | POA: Diagnosis not present

## 2012-12-20 DIAGNOSIS — Z7901 Long term (current) use of anticoagulants: Secondary | ICD-10-CM | POA: Diagnosis not present

## 2012-12-20 DIAGNOSIS — Z96649 Presence of unspecified artificial hip joint: Secondary | ICD-10-CM | POA: Diagnosis not present

## 2012-12-20 DIAGNOSIS — IMO0001 Reserved for inherently not codable concepts without codable children: Secondary | ICD-10-CM | POA: Diagnosis not present

## 2012-12-20 DIAGNOSIS — Z4801 Encounter for change or removal of surgical wound dressing: Secondary | ICD-10-CM | POA: Diagnosis not present

## 2012-12-20 DIAGNOSIS — Z471 Aftercare following joint replacement surgery: Secondary | ICD-10-CM | POA: Diagnosis not present

## 2012-12-22 DIAGNOSIS — Z7901 Long term (current) use of anticoagulants: Secondary | ICD-10-CM | POA: Diagnosis not present

## 2012-12-22 DIAGNOSIS — Z96649 Presence of unspecified artificial hip joint: Secondary | ICD-10-CM | POA: Diagnosis not present

## 2012-12-22 DIAGNOSIS — IMO0001 Reserved for inherently not codable concepts without codable children: Secondary | ICD-10-CM | POA: Diagnosis not present

## 2012-12-22 DIAGNOSIS — Z4801 Encounter for change or removal of surgical wound dressing: Secondary | ICD-10-CM | POA: Diagnosis not present

## 2012-12-22 DIAGNOSIS — Z471 Aftercare following joint replacement surgery: Secondary | ICD-10-CM | POA: Diagnosis not present

## 2012-12-25 ENCOUNTER — Encounter: Payer: Self-pay | Admitting: Family

## 2012-12-25 ENCOUNTER — Ambulatory Visit (INDEPENDENT_AMBULATORY_CARE_PROVIDER_SITE_OTHER): Payer: BC Managed Care – PPO | Admitting: Family

## 2012-12-25 VITALS — BP 132/60 | HR 68 | Temp 98.3°F | Wt 171.5 lb

## 2012-12-25 DIAGNOSIS — K644 Residual hemorrhoidal skin tags: Secondary | ICD-10-CM

## 2012-12-25 DIAGNOSIS — B351 Tinea unguium: Secondary | ICD-10-CM | POA: Diagnosis not present

## 2012-12-25 MED ORDER — CICLOPIROX 8 % EX SOLN: Freq: Every day | CUTANEOUS | Status: DC

## 2012-12-25 MED ORDER — HYDROCORTISONE ACETATE 25 MG RE SUPP: 25.0000 mg | Freq: Two times a day (BID) | RECTAL | Status: DC

## 2012-12-25 NOTE — Patient Instructions (Addendum)
Hemorrhoids Hemorrhoids are enlarged (dilated) veins around the rectum. There are 2 types of hemorrhoids, and the type of hemorrhoid is determined by its location. Internal hemorrhoids occur in the veins just inside the rectum.They are usually not painful, but they may bleed.However, they may poke through to the outside and become irritated and painful. External hemorrhoids involve the veins outside the anus and can be felt as a painful swelling or hard lump near the anus.They are often itchy and may crack and bleed. Sometimes clots will form in the veins. This makes them swollen and painful. These are called thrombosed hemorrhoids. CAUSES Causes of hemorrhoids include:  Pregnancy. This increases the pressure in the hemorrhoidal veins.  Constipation.  Straining to have a bowel movement.  Obesity.  Heavy lifting or other activity that caused you to strain. TREATMENT Most of the time hemorrhoids improve in 1 to 2 weeks. However, if symptoms do not seem to be getting better or if you have a lot of rectal bleeding, your caregiver may perform a procedure to help make the hemorrhoids get smaller or remove them completely.Possible treatments include:  Rubber band ligation. A rubber band is placed at the base of the hemorrhoid to cut off the circulation.  Sclerotherapy. A chemical is injected to shrink the hemorrhoid.  Infrared light therapy. Tools are used to burn the hemorrhoid.  Hemorrhoidectomy. This is surgical removal of the hemorrhoid. HOME CARE INSTRUCTIONS   Increase fiber in your diet. Ask your caregiver about using fiber supplements.  Drink enough water and fluids to keep your urine clear or pale yellow.  Exercise regularly.  Go to the bathroom when you have the urge to have a bowel movement. Do not wait.  Avoid straining to have bowel movements.  Keep the anal area dry and clean.  Only take over-the-counter or prescription medicines for pain, discomfort, or fever as  directed by your caregiver. If your hemorrhoids are thrombosed:  Take warm sitz baths for 20 to 30 minutes, 3 to 4 times per day.  If the hemorrhoids are very tender and swollen, place ice packs on the area as tolerated. Using ice packs between sitz baths may be helpful. Fill a plastic bag with ice. Place a towel between the bag of ice and your skin.  Medicated creams and suppositories may be used or applied as directed.  Do not use a donut-shaped pillow or sit on the toilet for long periods. This increases blood pooling and pain. SEEK MEDICAL CARE IF:   You have increasing pain and swelling that is not controlled with your medicine.  You have uncontrolled bleeding.  You have difficulty or you are unable to have a bowel movement.  You have pain or inflammation outside the area of the hemorrhoids.  You have chills or an oral temperature above 102 F (38.9 C). MAKE SURE YOU:   Understand these instructions.  Will watch your condition.  Will get help right away if you are not doing well or get worse. Document Released: 12/03/2000 Document Revised: 02/28/2012 Document Reviewed: 11/16/2010 [facility] Patient Information 2013 [place], [place].   Ringworm, Nail A fungal infection of the nail (tinea unguium/onychomycosis) is common. It is common as the visible part of the nail is composed of dead cells which have no blood supply to help prevent infection. It occurs because fungi are everywhere and will pick any opportunity to grow on any dead material. Because nails are very slow growing they require up to 2 years of treatment with anti-fungal medications. The  entire nail back to the base is infected. This includes approximately  of the nail which you cannot see. If your caregiver has prescribed a medication by mouth, take it every day and as directed. No progress will be seen for at least 6 to 9 months. Do not be disappointed! Because fungi live on dead cells with little or no exposure to  blood supply, medication delivery to the infection is slow; thus the cure is slow. It is also why you can observe no progress in the first 6 months. The nail becoming cured is the base of the nail, as it has the blood supply. Topical medication such as creams and ointments are usually not effective. Important in successful treatment of nail fungus is closely following the medication regimen that your doctor prescribes. Sometimes you and your caregiver may elect to speed up this process by surgical removal of all the nails. Even this may still require 6 to 9 months of additional oral medications. See your caregiver as directed. Remember there will be no visible improvement for at least 6 months. See your caregiver sooner if other signs of infection (redness and swelling) develop. Document Released: 12/03/2000 Document Revised: 02/28/2012 Document Reviewed: 02/11/2009 [facility] Patient Information 2013 [place], [place].

## 2012-12-25 NOTE — Progress Notes (Signed)
  Subjective:    Patient ID: Mary Summers, female    DOB: 1932-04-15, 79 y.o.   MRN: [id number]  HPI    Review of Systems     Objective:   Physical Exam  Skin:       Moderate hemorrhoid noted externally to the rectum. No thrombosis. Tender to palpation.  Fungal infection of the left lung noted. Partially detached nail from the nailbed. Black discoloration noted to one third of the nail.          Assessment & Plan:

## 2012-12-25 NOTE — Progress Notes (Signed)
Subjective:    Patient ID: Mary Summers, female    DOB: 1932/06/19, 79 y.o.   MRN: [id number]  HPI 79 year old white female, nonsmoker is in today with complaints of an abnormal growth from her rectum x3 days. Patient recently had surgery and is currently on pain medication. Reports smaller more difficult stools since she's been on pain medication. Husband has been given her MiraLax. She has a history of diverticulitis. Lobe Donnell pain or back pain.  Patient also has a fungus to her left thumbnail is x several months. The nail is detaching from the skin that is discolored. No pain.    Review of Systems  Constitutional: Negative.   Respiratory: Negative.   Cardiovascular: Negative.   Gastrointestinal: Positive for rectal pain. Negative for abdominal pain.  Genitourinary: Negative.   Musculoskeletal: Negative.   Skin: Negative.   Neurological: Negative.   Hematological: Negative.   Psychiatric/Behavioral: Negative.    Past Medical History  Diagnosis Date  . Seasonal allergies   . COPD (chronic obstructive pulmonary disease)     PT HAS BEEN TOLD COPD--BUT SHE DOES NOT NOTICE SOB--HX OF EXPOSURE TO CHEMICALS SUCH AS LACQUERS FOR FURNITURE REFINISHING--NEVER SMOKED  . Arthritis   . Peripheral vascular disease     MILD CAROTID DISEASE BY DOPPLER STUDY  11/13/12. -REPORT FROM [facility]    History   Social History  . Marital Status: Married    Spouse Name: N/A    Number of Children: N/A  . Years of Education: N/A   Occupational History  . Not on file.   Social History Main Topics  . Smoking status: Never Smoker   . Smokeless tobacco: Never Used  . Alcohol Use: Yes     Comment: OCCAS ALCOHOL  . Drug Use: No  . Sexually Active:    Other Topics Concern  . Not on file   Social History Narrative  . No narrative on file    Past Surgical History  Procedure Date  . Congenitally absent uterus   . Breast surgery     BREAST BIOPSIES  X 2  - NO CANCER  . Laser vocal cord for nodule  1985   . Total hip arthroplasty 11/29/2012    Procedure: TOTAL HIP ARTHROPLASTY ANTERIOR APPROACH;  Surgeon: Loanne Drilling, MD;  Location: [facility];  Service: Orthopedics;  Laterality: Left;    No family history on file.  No Known Allergies  Current Outpatient Prescriptions on File Prior to Visit  Medication Sig Dispense Refill  . atorvastatin (LIPITOR) 40 MG tablet Take 40 mg by mouth daily before breakfast.       . fluticasone (FLONASE) 50 MCG/ACT nasal spray Place 1 spray into the nose. ONCE A DAY IF NEEDED      . methocarbamol (ROBAXIN) 500 MG tablet Take 1 tablet (500 mg total) by mouth every 6 (six) hours as needed.  60 tablet  1  . Multiple Vitamin (MULTIVITAMIN) tablet Take 1 tablet by mouth daily.       . rivaroxaban (XARELTO) 10 MG TABS tablet Take 1 tablet (10 mg total) by mouth daily with breakfast.  17 tablet  0  . traMADol (ULTRAM) 50 MG tablet Take 1-2 tablets (50-100 mg total) by mouth every 6 (six) hours as needed.  60 tablet  1  . oxyCODONE (OXY IR/ROXICODONE) 5 MG immediate release tablet Take 1-2 tablets (5-10 mg total) by mouth every 3 (three) hours as needed.  80 tablet  0  BP 132/60  Pulse 68  Temp 98.3 F (36.8 C) (Oral)  Wt 171 lb 8 oz (77.792 kg)  SpO2 98%chart    Objective:   Physical Exam  Constitutional: She is oriented to person, place, and time. She appears well-developed and well-nourished.  Neck: Normal range of motion. Neck supple.  Cardiovascular: Normal rate, regular rhythm and normal heart sounds.   Pulmonary/Chest: Effort normal and breath sounds normal.  Abdominal: Soft. Bowel sounds are normal. There is no tenderness. There is no rebound.  Musculoskeletal: Normal range of motion.  Neurological: She is alert and oriented to person, place, and time.  Skin: Skin is warm and dry.  Psychiatric: She has a normal mood and affect.          Assessment & Plan:  Assessment: External  hemorrhoid, onychomycosis  Plan: Anusol-HC one suppository twice a day x7 days. Stool softener as needed. Drink plenty of water. Penlac applied to the thumb daily at night.  liver function is normal. Recheck in 6 weeks.

## 2012-12-26 DIAGNOSIS — Z7901 Long term (current) use of anticoagulants: Secondary | ICD-10-CM | POA: Diagnosis not present

## 2012-12-26 DIAGNOSIS — Z96649 Presence of unspecified artificial hip joint: Secondary | ICD-10-CM | POA: Diagnosis not present

## 2012-12-26 DIAGNOSIS — IMO0001 Reserved for inherently not codable concepts without codable children: Secondary | ICD-10-CM | POA: Diagnosis not present

## 2012-12-26 DIAGNOSIS — Z4801 Encounter for change or removal of surgical wound dressing: Secondary | ICD-10-CM | POA: Diagnosis not present

## 2012-12-26 DIAGNOSIS — Z471 Aftercare following joint replacement surgery: Secondary | ICD-10-CM | POA: Diagnosis not present

## 2012-12-27 DIAGNOSIS — Z4801 Encounter for change or removal of surgical wound dressing: Secondary | ICD-10-CM | POA: Diagnosis not present

## 2012-12-27 DIAGNOSIS — Z96649 Presence of unspecified artificial hip joint: Secondary | ICD-10-CM | POA: Diagnosis not present

## 2012-12-27 DIAGNOSIS — Z471 Aftercare following joint replacement surgery: Secondary | ICD-10-CM | POA: Diagnosis not present

## 2012-12-27 DIAGNOSIS — Z7901 Long term (current) use of anticoagulants: Secondary | ICD-10-CM | POA: Diagnosis not present

## 2012-12-27 DIAGNOSIS — IMO0001 Reserved for inherently not codable concepts without codable children: Secondary | ICD-10-CM | POA: Diagnosis not present

## 2012-12-29 DIAGNOSIS — IMO0001 Reserved for inherently not codable concepts without codable children: Secondary | ICD-10-CM | POA: Diagnosis not present

## 2012-12-29 DIAGNOSIS — Z7901 Long term (current) use of anticoagulants: Secondary | ICD-10-CM | POA: Diagnosis not present

## 2012-12-29 DIAGNOSIS — Z96649 Presence of unspecified artificial hip joint: Secondary | ICD-10-CM | POA: Diagnosis not present

## 2012-12-29 DIAGNOSIS — Z471 Aftercare following joint replacement surgery: Secondary | ICD-10-CM | POA: Diagnosis not present

## 2012-12-29 DIAGNOSIS — Z4801 Encounter for change or removal of surgical wound dressing: Secondary | ICD-10-CM | POA: Diagnosis not present

## 2013-01-05 DIAGNOSIS — Z96649 Presence of unspecified artificial hip joint: Secondary | ICD-10-CM | POA: Diagnosis not present

## 2013-01-10 DIAGNOSIS — M169 Osteoarthritis of hip, unspecified: Secondary | ICD-10-CM | POA: Diagnosis not present

## 2013-01-12 DIAGNOSIS — M169 Osteoarthritis of hip, unspecified: Secondary | ICD-10-CM | POA: Diagnosis not present

## 2013-01-15 DIAGNOSIS — M169 Osteoarthritis of hip, unspecified: Secondary | ICD-10-CM | POA: Diagnosis not present

## 2013-01-17 DIAGNOSIS — M169 Osteoarthritis of hip, unspecified: Secondary | ICD-10-CM | POA: Diagnosis not present

## 2013-01-19 DIAGNOSIS — M169 Osteoarthritis of hip, unspecified: Secondary | ICD-10-CM | POA: Diagnosis not present

## 2013-01-22 DIAGNOSIS — M169 Osteoarthritis of hip, unspecified: Secondary | ICD-10-CM | POA: Diagnosis not present

## 2013-01-24 DIAGNOSIS — M169 Osteoarthritis of hip, unspecified: Secondary | ICD-10-CM | POA: Diagnosis not present

## 2013-01-26 DIAGNOSIS — M169 Osteoarthritis of hip, unspecified: Secondary | ICD-10-CM | POA: Diagnosis not present

## 2013-01-29 DIAGNOSIS — M169 Osteoarthritis of hip, unspecified: Secondary | ICD-10-CM | POA: Diagnosis not present

## 2013-01-31 DIAGNOSIS — M169 Osteoarthritis of hip, unspecified: Secondary | ICD-10-CM | POA: Diagnosis not present

## 2013-02-02 DIAGNOSIS — M169 Osteoarthritis of hip, unspecified: Secondary | ICD-10-CM | POA: Diagnosis not present

## 2013-02-05 DIAGNOSIS — M169 Osteoarthritis of hip, unspecified: Secondary | ICD-10-CM | POA: Diagnosis not present

## 2013-02-07 DIAGNOSIS — M169 Osteoarthritis of hip, unspecified: Secondary | ICD-10-CM | POA: Diagnosis not present

## 2013-03-26 DIAGNOSIS — E782 Mixed hyperlipidemia: Secondary | ICD-10-CM | POA: Diagnosis not present

## 2013-03-26 DIAGNOSIS — I6529 Occlusion and stenosis of unspecified carotid artery: Secondary | ICD-10-CM | POA: Diagnosis not present

## 2013-03-29 DIAGNOSIS — E782 Mixed hyperlipidemia: Secondary | ICD-10-CM | POA: Diagnosis not present

## 2013-04-24 ENCOUNTER — Ambulatory Visit (INDEPENDENT_AMBULATORY_CARE_PROVIDER_SITE_OTHER): Payer: BC Managed Care – PPO | Admitting: Internal Medicine

## 2013-04-24 ENCOUNTER — Encounter: Payer: Self-pay | Admitting: Internal Medicine

## 2013-04-24 VITALS — BP 130/76 | HR 90 | Temp 98.1°F | Resp 18 | Wt 180.0 lb

## 2013-04-24 DIAGNOSIS — L259 Unspecified contact dermatitis, unspecified cause: Secondary | ICD-10-CM | POA: Diagnosis not present

## 2013-04-24 DIAGNOSIS — M25559 Pain in unspecified hip: Secondary | ICD-10-CM | POA: Diagnosis not present

## 2013-04-24 MED ORDER — CICLOPIROX 8 % EX SOLN: Freq: Every day | CUTANEOUS | Status: DC

## 2013-04-24 MED ORDER — TRIAMCINOLONE ACETONIDE 0.1 % EX CREA
TOPICAL_CREAM | Freq: Two times a day (BID) | CUTANEOUS | Status: DC
Start: 2013-04-24 — End: 2013-11-21

## 2013-04-24 MED ORDER — METHYLPREDNISOLONE ACETATE 80 MG/ML IJ SUSP
80.0000 mg | Freq: Once | INTRAMUSCULAR | Status: AC
  Administered 2013-04-24: 80 mg via INTRAMUSCULAR

## 2013-04-24 NOTE — Progress Notes (Signed)
Subjective:    Patient ID: Mary Summers, female    DOB: Jan 03, 1932, 79 y.o.   MRN: [id number]  HPI  79 year old patient who has a history of osteoarthritis. She presents today with a chief complaint of a pruritic rash involving her arms after doing the outdoor gardening. She has had episodes of contact dermatitis in the past. She will be leaving in a couple weeks for a trip to [place].  Past Medical History  Diagnosis Date  . Seasonal allergies   . COPD (chronic obstructive pulmonary disease)     PT HAS BEEN TOLD COPD--BUT SHE DOES NOT NOTICE SOB--HX OF EXPOSURE TO CHEMICALS SUCH AS LACQUERS FOR FURNITURE REFINISHING--NEVER SMOKED  . Arthritis   . Peripheral vascular disease     MILD CAROTID DISEASE BY DOPPLER STUDY  11/13/12. -REPORT FROM [facility]    History   Social History  . Marital Status: Married    Spouse Name: N/A    Number of Children: N/A  . Years of Education: N/A   Occupational History  . Not on file.   Social History Main Topics  . Smoking status: Never Smoker   . Smokeless tobacco: Never Used  . Alcohol Use: Yes     Comment: OCCAS ALCOHOL  . Drug Use: No  . Sexually Active:    Other Topics Concern  . Not on file   Social History Narrative  . No narrative on file    Past Surgical History  Procedure Laterality Date  . Congenitally absent uterus    . Breast surgery      BREAST BIOPSIES X 2  - NO CANCER  . Laser vocal cord for nodule  1985    . Total hip arthroplasty  11/29/2012    Procedure: TOTAL HIP ARTHROPLASTY ANTERIOR APPROACH;  Surgeon: Loanne Drilling, MD;  Location: [facility];  Service: Orthopedics;  Laterality: Left;    History reviewed. No pertinent family history.  No Known Allergies  Current Outpatient Prescriptions on File Prior to Visit  Medication Sig Dispense Refill  . atorvastatin (LIPITOR) 40 MG tablet Take 40 mg by mouth daily before breakfast.       . ciclopirox (PENLAC) 8 % solution Apply  topically at bedtime. Apply over nail and surrounding skin. Apply daily over previous coat. After seven (7) days, may remove with alcohol and continue cycle.  6.6 mL  0  . fluticasone (FLONASE) 50 MCG/ACT nasal spray Place 1 spray into the nose. ONCE A DAY IF NEEDED      . hydrocortisone (ANUSOL-HC) 25 MG suppository Place 1 suppository (25 mg total) rectally 2 (two) times daily.  14 suppository  0  . Multiple Vitamin (MULTIVITAMIN) tablet Take 1 tablet by mouth daily.        No current facility-administered medications on file prior to visit.    BP 130/76  Pulse 90  Temp(Src) 98.1 F (36.7 C) (Oral)  Resp 18  Wt 180 lb (81.647 kg)  BMI 28.19 kg/m2  SpO2 98%       Review of Systems  Constitutional: Negative.   HENT: Negative for hearing loss, congestion, sore throat, rhinorrhea, dental problem, sinus pressure and tinnitus.   Eyes: Negative for pain, discharge and visual disturbance.  Respiratory: Negative for cough and shortness of breath.   Cardiovascular: Negative for chest pain, palpitations and leg swelling.  Gastrointestinal: Negative for nausea, vomiting, abdominal pain, diarrhea, constipation, blood in stool and abdominal distention.  Genitourinary: Negative for dysuria, urgency, frequency,  hematuria, flank pain, vaginal bleeding, vaginal discharge, difficulty urinating, vaginal pain and pelvic pain.  Musculoskeletal: Positive for arthralgias. Negative for joint swelling and gait problem.  Skin: Positive for rash.  Neurological: Negative for dizziness, syncope, speech difficulty, weakness, numbness and headaches.  Hematological: Negative for adenopathy.  Psychiatric/Behavioral: Negative for behavioral problems, dysphoric mood and agitation. The patient is not nervous/anxious.        Objective:   Physical Exam  Constitutional: She appears well-developed and well-nourished. No distress.  Skin:  Erythematous vesicular rash involving the inner aspects of both arms and  hands          Assessment & Plan:   Content dermatitis. Will treat with Depo-Medrol and topical triamcinolone Osteoarthritis Onychomycosis. Penlac refilled

## 2013-04-24 NOTE — Patient Instructions (Addendum)
Call or return to clinic prn if these symptoms worsen or fail to improve as anticipated.

## 2013-04-26 ENCOUNTER — Telehealth: Payer: Self-pay | Admitting: Internal Medicine

## 2013-04-26 NOTE — Telephone Encounter (Signed)
ok 

## 2013-04-26 NOTE — Telephone Encounter (Signed)
Pt would like to switch from Dr swords to Dr Amador Cunas.  Is that OK with you Dr Cato Mulligan? Is that [facility] with you Dr Kirtland Bouchard ?

## 2013-04-27 NOTE — Telephone Encounter (Signed)
ok 

## 2013-04-27 NOTE — Telephone Encounter (Signed)
Pt aware/kh 

## 2013-05-11 DIAGNOSIS — Z96649 Presence of unspecified artificial hip joint: Secondary | ICD-10-CM | POA: Diagnosis not present

## 2013-05-11 DIAGNOSIS — Z471 Aftercare following joint replacement surgery: Secondary | ICD-10-CM | POA: Diagnosis not present

## 2013-10-15 ENCOUNTER — Other Ambulatory Visit (HOSPITAL_COMMUNITY): Payer: Self-pay | Admitting: Internal Medicine

## 2013-10-15 NOTE — Telephone Encounter (Signed)
Rx was sent to pharmacy electronically. 

## 2013-10-25 ENCOUNTER — Other Ambulatory Visit: Payer: Self-pay

## 2013-10-31 DIAGNOSIS — Z471 Aftercare following joint replacement surgery: Secondary | ICD-10-CM | POA: Diagnosis not present

## 2013-10-31 DIAGNOSIS — M25569 Pain in unspecified knee: Secondary | ICD-10-CM | POA: Diagnosis not present

## 2013-11-20 DIAGNOSIS — L608 Other nail disorders: Secondary | ICD-10-CM | POA: Diagnosis not present

## 2013-11-20 DIAGNOSIS — L723 Sebaceous cyst: Secondary | ICD-10-CM | POA: Diagnosis not present

## 2013-11-20 DIAGNOSIS — L570 Actinic keratosis: Secondary | ICD-10-CM | POA: Diagnosis not present

## 2013-11-20 DIAGNOSIS — L219 Seborrheic dermatitis, unspecified: Secondary | ICD-10-CM | POA: Diagnosis not present

## 2013-11-20 DIAGNOSIS — D235 Other benign neoplasm of skin of trunk: Secondary | ICD-10-CM | POA: Diagnosis not present

## 2013-11-20 DIAGNOSIS — Z85828 Personal history of other malignant neoplasm of skin: Secondary | ICD-10-CM | POA: Diagnosis not present

## 2013-11-20 DIAGNOSIS — D1801 Hemangioma of skin and subcutaneous tissue: Secondary | ICD-10-CM | POA: Diagnosis not present

## 2013-11-20 DIAGNOSIS — L821 Other seborrheic keratosis: Secondary | ICD-10-CM | POA: Diagnosis not present

## 2013-11-21 ENCOUNTER — Ambulatory Visit (INDEPENDENT_AMBULATORY_CARE_PROVIDER_SITE_OTHER)
Admission: RE | Admit: 2013-11-21 | Discharge: 2013-11-21 | Disposition: A | Discharge status: Home / Self Care | Payer: BC Managed Care – PPO | Source: Ambulatory Visit | Attending: Internal Medicine | Admitting: Internal Medicine

## 2013-11-21 ENCOUNTER — Ambulatory Visit (INDEPENDENT_AMBULATORY_CARE_PROVIDER_SITE_OTHER): Payer: Medicare Other | Admitting: Internal Medicine

## 2013-11-21 ENCOUNTER — Encounter: Payer: Self-pay | Admitting: Internal Medicine

## 2013-11-21 VITALS — BP 130/68 | HR 95 | Temp 98.2°F | Resp 20 | Ht 68.0 in | Wt 196.0 lb

## 2013-11-21 DIAGNOSIS — M169 Osteoarthritis of hip, unspecified: Secondary | ICD-10-CM

## 2013-11-21 DIAGNOSIS — J449 Chronic obstructive pulmonary disease, unspecified: Secondary | ICD-10-CM | POA: Diagnosis not present

## 2013-11-21 DIAGNOSIS — R05 Cough: Secondary | ICD-10-CM | POA: Diagnosis not present

## 2013-11-21 DIAGNOSIS — R059 Cough, unspecified: Secondary | ICD-10-CM

## 2013-11-21 DIAGNOSIS — R053 Chronic cough: Secondary | ICD-10-CM

## 2013-11-21 DIAGNOSIS — Z0000 Encounter for general adult medical examination without abnormal findings: Secondary | ICD-10-CM

## 2013-11-21 DIAGNOSIS — E785 Hyperlipidemia, unspecified: Secondary | ICD-10-CM

## 2013-11-21 DIAGNOSIS — Z23 Encounter for immunization: Secondary | ICD-10-CM | POA: Diagnosis not present

## 2013-11-21 LAB — TSH: TSH: 1.14 u[IU]/mL (ref 0.35–5.50)

## 2013-11-21 LAB — CBC WITH DIFFERENTIAL/PLATELET
Basophils Absolute: 0.1 10*3/uL (ref 0.0–0.1)
Basophils Relative: 0.8 % (ref 0.0–3.0)
Eosinophils Absolute: 0.2 10*3/uL (ref 0.0–0.7)
Eosinophils Relative: 3.4 % (ref 0.0–5.0)
HCT: 42.8 % (ref 36.0–46.0)
Hemoglobin: 14.5 g/dL (ref 12.0–15.0)
Lymphocytes Relative: 16.5 % (ref 12.0–46.0)
Lymphs Abs: 1.2 10*3/uL (ref 0.7–4.0)
MCHC: 33.8 g/dL (ref 30.0–36.0)
MCV: 88.8 fl (ref 78.0–100.0)
Monocytes Absolute: 0.5 10*3/uL (ref 0.1–1.0)
Monocytes Relative: 7.6 % (ref 3.0–12.0)
Neutro Abs: 5 10*3/uL (ref 1.4–7.7)
Neutrophils Relative %: 71.7 % (ref 43.0–77.0)
Platelets: 203 10*3/uL (ref 150.0–400.0)
RBC: 4.82 Mil/uL (ref 3.87–5.11)
RDW: 12.9 % (ref 11.5–14.6)
WBC: 7 10*3/uL (ref 4.5–10.5)

## 2013-11-21 LAB — COMPREHENSIVE METABOLIC PANEL
Albumin: 4 g/dL (ref 3.5–5.2)
Alkaline Phosphatase: 79 U/L (ref 39–117)
BUN: 15 mg/dL (ref 6–23)
CO2: 30 mEq/L (ref 19–32)
GFR: 98.57 mL/min (ref >60.00)
Glucose, Bld: 89 mg/dL (ref 70–99)
Potassium: 4.5 mEq/L (ref 3.5–5.1)
Total Bilirubin: 0.3 mg/dL (ref 0.3–1.2)

## 2013-11-21 LAB — LIPID PANEL
Cholesterol: 151 mg/dL (ref 0–200)
HDL: 52.8 mg/dL
LDL Cholesterol: 69 mg/dL (ref 0–99)
Total CHOL/HDL Ratio: 3
Triglycerides: 144 mg/dL (ref 0.0–149.0)
VLDL: 28.8 mg/dL (ref 0.0–40.0)

## 2013-11-21 MED ORDER — CICLOPIROX 8 % EX SOLN: Freq: Every day | CUTANEOUS | Status: DC

## 2013-11-21 MED ORDER — HYDROCORTISONE ACETATE 25 MG RE SUPP: 25.0000 mg | Freq: Two times a day (BID) | RECTAL | Status: DC

## 2013-11-21 MED ORDER — FLUTICASONE PROPIONATE 50 MCG/ACT NA SUSP: 1.0000 | Freq: Every day | NASAL | Status: DC | PRN

## 2013-11-21 NOTE — Progress Notes (Signed)
Subjective:    Patient ID: Mary Summers, female    DOB: 10-17-1932, 79 y.o.   MRN: [id number]  HPI Pre-visit discussion using our clinic review tool. No additional management support is needed unless otherwise documented below in the visit note.  79 year old patient who is seen today for a preventive health examination. She has history of dyslipidemia which is well-controlled on atorvastatin. She has osteoarthritis and is about 1 year status post total hip replacement surgery. She has done quite well. No cardiopulmonary complaints. She does have a cardiologist. Denies any cardiopulmonary complaints.  Past Medical History  Diagnosis Date  . Seasonal allergies   . COPD (chronic obstructive pulmonary disease)     PT HAS BEEN TOLD COPD--BUT SHE DOES NOT NOTICE SOB--HX OF EXPOSURE TO CHEMICALS SUCH AS LACQUERS FOR FURNITURE REFINISHING--NEVER SMOKED  . Arthritis   . Peripheral vascular disease     MILD CAROTID DISEASE BY DOPPLER STUDY  11/13/12. -REPORT FROM [facility]    History   Social History  . Marital Status: Married    Spouse Name: N/A    Number of Children: N/A  . Years of Education: N/A   Occupational History  . Not on file.   Social History Main Topics  . Smoking status: Never Smoker   . Smokeless tobacco: Never Used  . Alcohol Use: Yes     Comment: OCCAS ALCOHOL  . Drug Use: No  . Sexual Activity:    Other Topics Concern  . Not on file   Social History Narrative  . No narrative on file    Past Surgical History  Procedure Laterality Date  . Congenitally absent uterus    . Breast surgery      BREAST BIOPSIES X 2  - NO CANCER  . Laser vocal cord for nodule  1985    . Total hip arthroplasty  11/29/2012    Procedure: TOTAL HIP ARTHROPLASTY ANTERIOR APPROACH;  Surgeon: Loanne Drilling, MD;  Location: [facility];  Service: Orthopedics;  Laterality: Left;    History reviewed. No pertinent family history.  No Known  Allergies  Current Outpatient Prescriptions on File Prior to Visit  Medication Sig Dispense Refill  . atorvastatin (LIPITOR) 40 MG tablet TAKE 1 TABLET BY MOUTH EVERY DAY  90 tablet  1  . Multiple Vitamin (MULTIVITAMIN) tablet Take 1 tablet by mouth daily.        No current facility-administered medications on file prior to visit.    BP 130/68  Pulse 95  Temp(Src) 98.2 F (36.8 C) (Oral)  Resp 20  Ht 5\' 8"  (1.727 m)  Wt 196 lb (88.905 kg)  BMI 29.81 kg/m2  SpO2 96%       Review of Systems  Constitutional: Negative for fever, appetite change, fatigue and unexpected weight change.  HENT: Negative for congestion, dental problem, ear pain, hearing loss, mouth sores, nosebleeds, sinus pressure, sore throat, tinnitus, trouble swallowing and voice change.   Eyes: Negative for photophobia, pain, redness and visual disturbance.  Respiratory: Positive for cough. Negative for chest tightness and shortness of breath.        Chronic cough for several months  Cardiovascular: Negative for chest pain, palpitations and leg swelling.  Gastrointestinal: Negative for nausea, vomiting, abdominal pain, diarrhea, constipation, blood in stool, abdominal distention and rectal pain.  Genitourinary: Negative for dysuria, urgency, frequency, hematuria, flank pain, vaginal bleeding, vaginal discharge, difficulty urinating, genital sores, vaginal pain, menstrual problem and pelvic pain.  Musculoskeletal:  Positive for back pain. Negative for arthralgias and neck stiffness.  Skin: Negative for rash.  Neurological: Negative for dizziness, syncope, speech difficulty, weakness, light-headedness, numbness and headaches.       Decreased inspiratory sensation lower extremities. Intact monofilament testing  Hematological: Negative for adenopathy. Does not bruise/bleed easily.  Psychiatric/Behavioral: Negative for suicidal ideas, behavioral problems, self-injury, dysphoric mood and agitation. The patient is not  nervous/anxious.        Objective:   Physical Exam  Constitutional: She is oriented to person, place, and time. She appears well-developed and well-nourished.  HENT:  Head: Normocephalic and atraumatic.  Right Ear: External ear normal.  Left Ear: External ear normal.  Mouth/Throat: Oropharynx is clear and moist.  Eyes: Conjunctivae and EOM are normal.  Neck: Normal range of motion. Neck supple. No JVD present. No thyromegaly present.  Cardiovascular: Normal rate, regular rhythm, normal heart sounds and intact distal pulses.   No murmur heard. Pulmonary/Chest: Effort normal and breath sounds normal. She has no wheezes. She has no rales.  Abdominal: Soft. Bowel sounds are normal. She exhibits no distension and no mass. There is no tenderness. There is no rebound and no guarding.  Genitourinary: Guaiac negative stool.  Musculoskeletal: Normal range of motion. She exhibits no edema and no tenderness.  Neurological: She is alert and oriented to person, place, and time. She has normal reflexes. No cranial nerve deficit. She exhibits normal muscle tone. Coordination normal.  Decreased vibratory sensation lower extremities  Skin: Skin is warm and dry. No rash noted.  Resolving cryotherapy wound mid anterior chest  Biopsy scars right breast  Psychiatric: She has a normal mood and affect. Her behavior is normal.          Assessment & Plan:   Preventive health examination Osteoarthritis dyslipidemia. Continue atorvastatin  We'll review laboratory update Recheck one year

## 2013-11-21 NOTE — Progress Notes (Signed)
Pre-visit discussion using our clinic review tool. No additional management support is needed unless otherwise documented below in the visit note.  

## 2013-11-21 NOTE — Patient Instructions (Signed)
Limit your sodium (Salt) intake    It is important that you exercise regularly, at least 20 minutes 3 to 4 times per week.  If you develop chest pain or shortness of breath seek  medical attention.  Return in one year for follow-up   

## 2013-11-22 ENCOUNTER — Ambulatory Visit: Payer: Medicare Other | Admitting: Internal Medicine

## 2013-12-03 ENCOUNTER — Ambulatory Visit: Payer: Medicare Other | Admitting: Internal Medicine

## 2013-12-11 DIAGNOSIS — M1710 Unilateral primary osteoarthritis, unspecified knee: Secondary | ICD-10-CM | POA: Diagnosis not present

## 2014-02-04 IMAGING — CR DG CHEST 2V
2 series · 2 of 2 positions shown · non-contrast
Comparison: 12/01/2012

CLINICAL DATA: Cough

EXAM:
CHEST  2 VIEW

[view not recorded (1 of 2)]
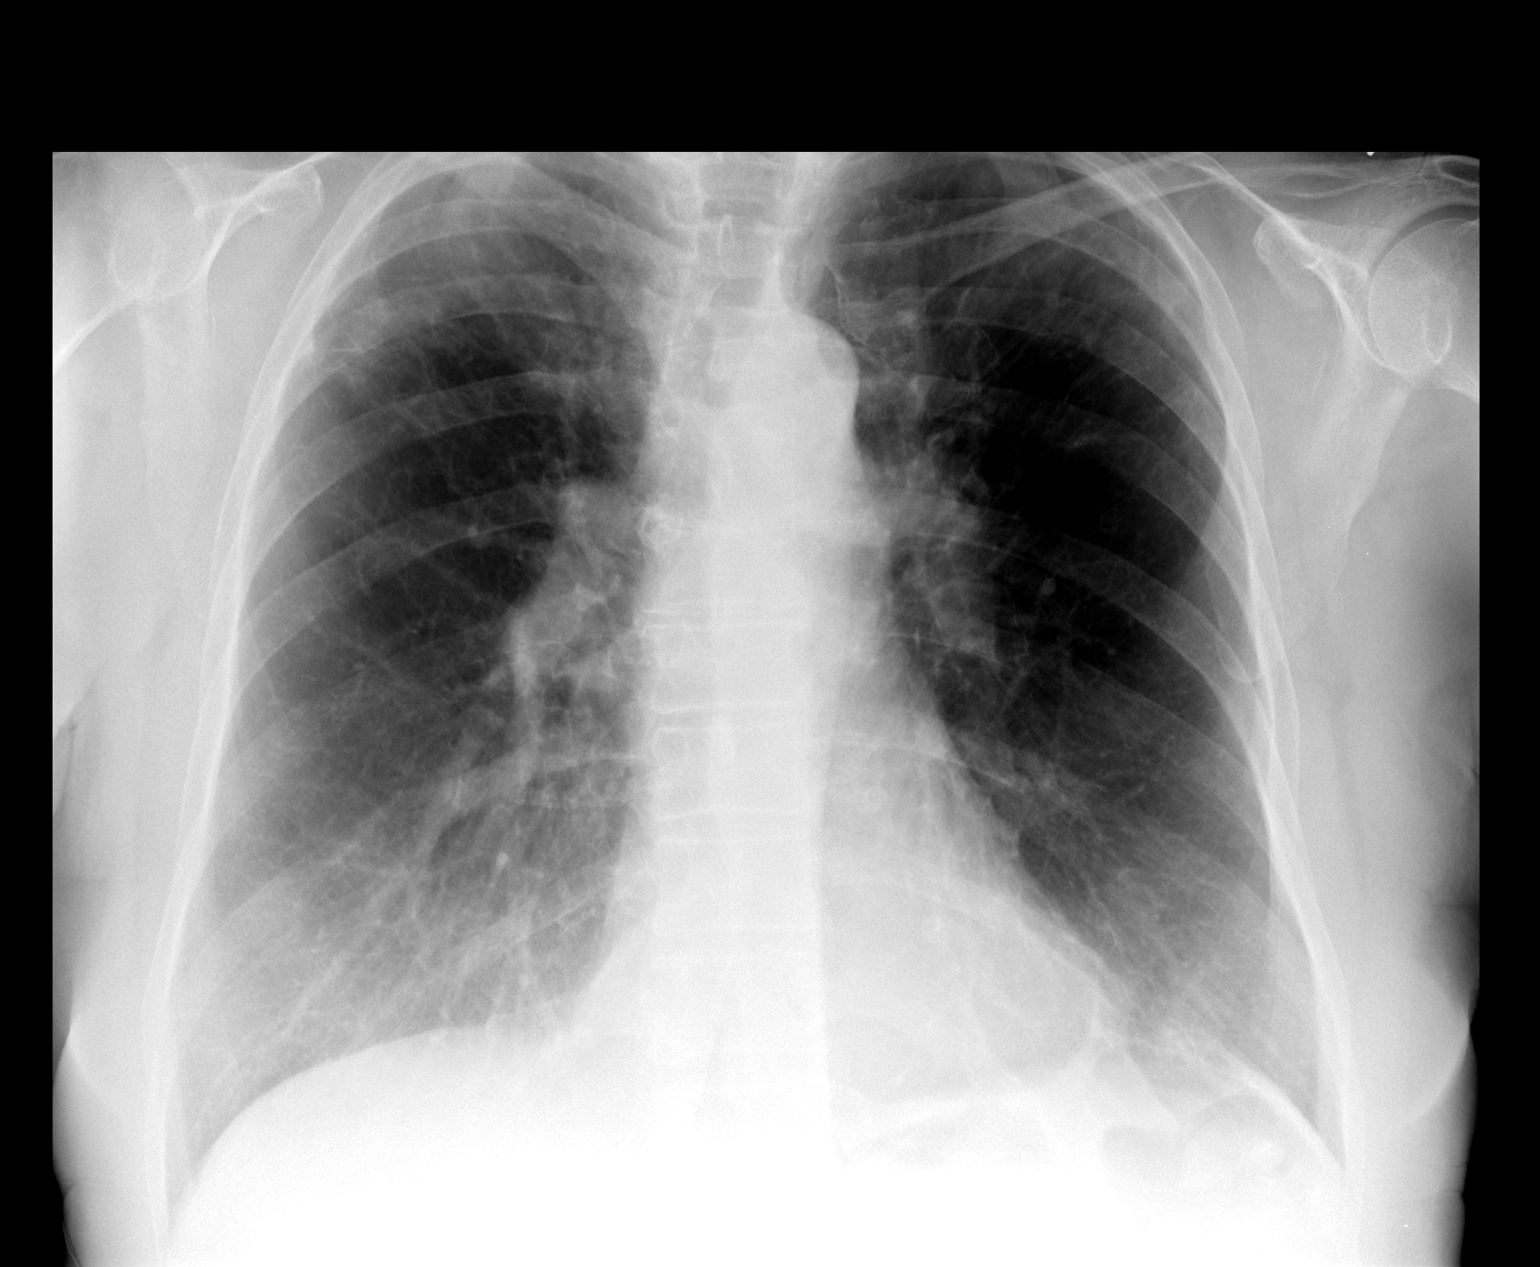

[view not recorded (2 of 2)]
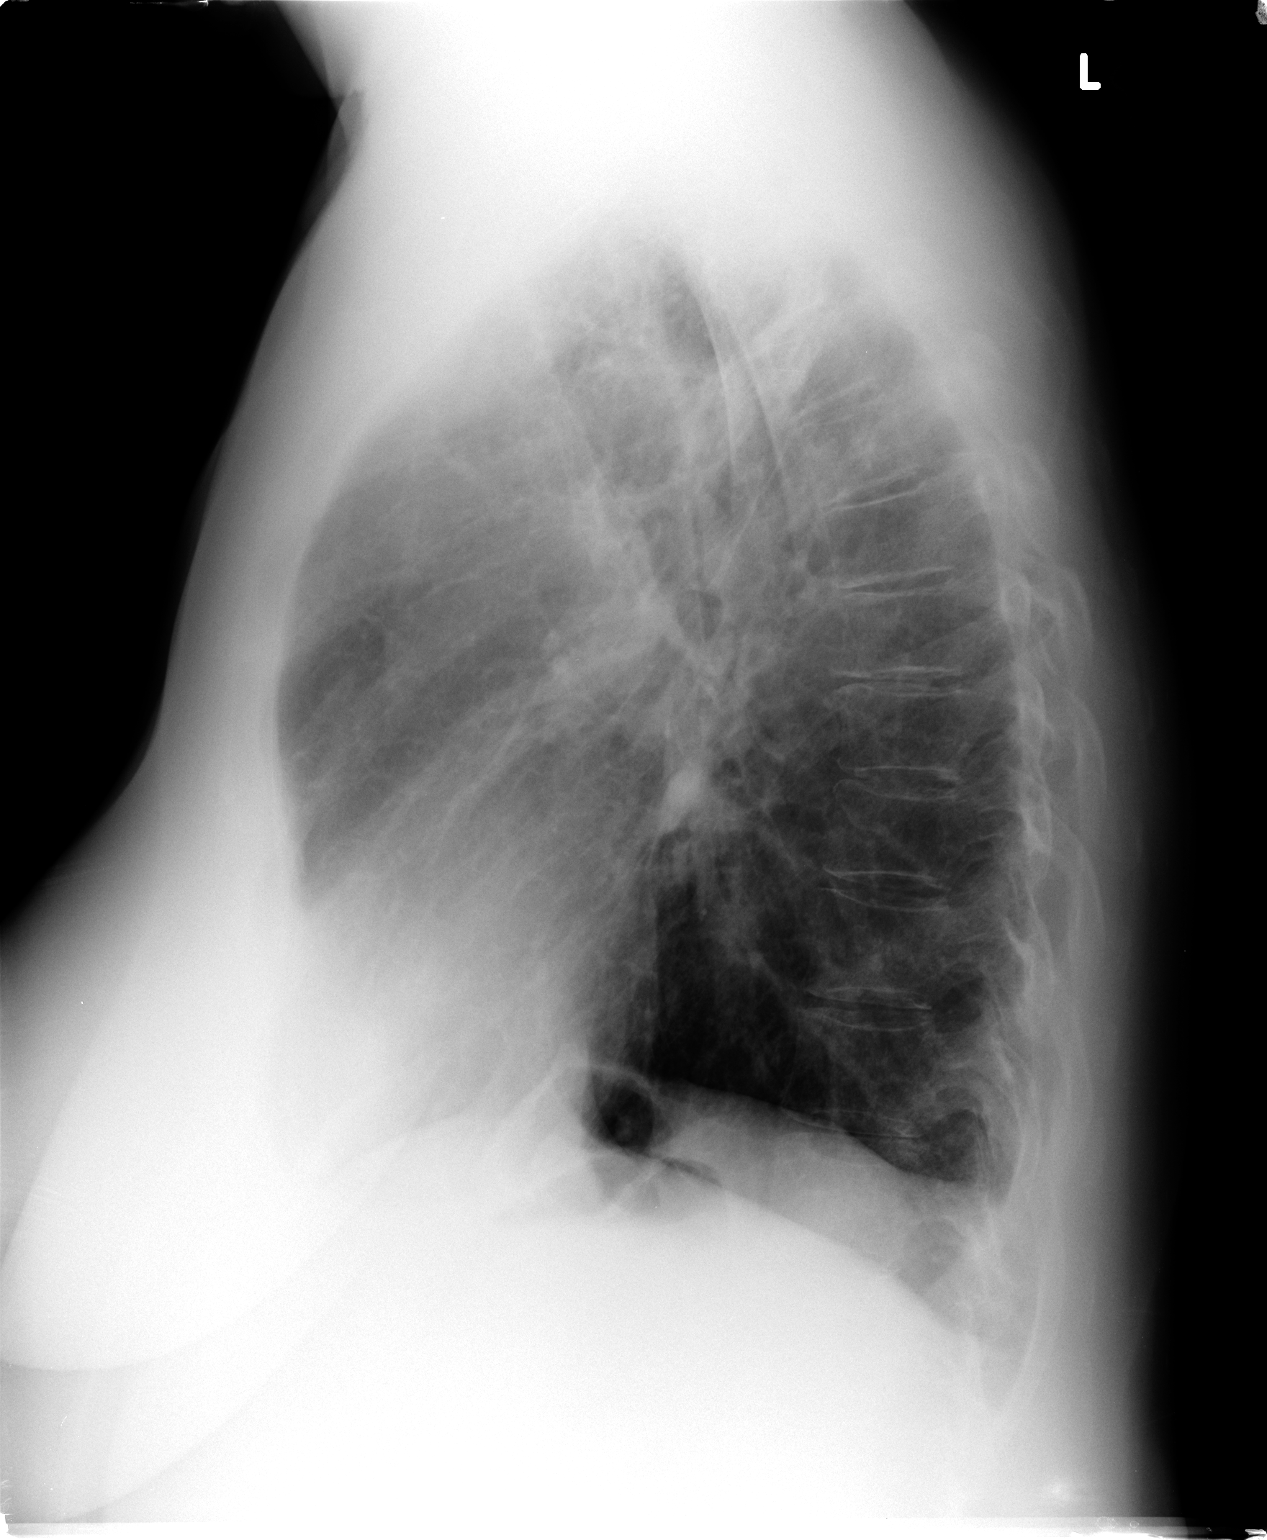

[2 of 2 positions shown; findings below may reference images not displayed]

FINDINGS: COPD with hyperinflation. Apical scarring bilaterally is stable.
Mild bibasilar scarring also unchanged. Negative for heart failure
or pneumonia.
IMPRESSION: COPD and scarring.  No acute abnormality and no interval change.

## 2014-02-26 DIAGNOSIS — C44519 Basal cell carcinoma of skin of other part of trunk: Secondary | ICD-10-CM | POA: Diagnosis not present

## 2014-02-26 DIAGNOSIS — L820 Inflamed seborrheic keratosis: Secondary | ICD-10-CM | POA: Diagnosis not present

## 2014-02-26 DIAGNOSIS — D216 Benign neoplasm of connective and other soft tissue of trunk, unspecified: Secondary | ICD-10-CM | POA: Diagnosis not present

## 2014-02-26 DIAGNOSIS — L821 Other seborrheic keratosis: Secondary | ICD-10-CM | POA: Diagnosis not present

## 2014-02-26 DIAGNOSIS — L738 Other specified follicular disorders: Secondary | ICD-10-CM | POA: Diagnosis not present

## 2014-02-26 DIAGNOSIS — D1801 Hemangioma of skin and subcutaneous tissue: Secondary | ICD-10-CM | POA: Diagnosis not present

## 2014-02-26 DIAGNOSIS — Z85828 Personal history of other malignant neoplasm of skin: Secondary | ICD-10-CM | POA: Diagnosis not present

## 2014-03-28 DIAGNOSIS — Z471 Aftercare following joint replacement surgery: Secondary | ICD-10-CM | POA: Diagnosis not present

## 2014-03-28 DIAGNOSIS — Z96649 Presence of unspecified artificial hip joint: Secondary | ICD-10-CM | POA: Diagnosis not present

## 2014-03-28 DIAGNOSIS — M1710 Unilateral primary osteoarthritis, unspecified knee: Secondary | ICD-10-CM | POA: Diagnosis not present

## 2014-04-02 ENCOUNTER — Telehealth: Payer: Self-pay | Admitting: Internal Medicine

## 2014-04-02 NOTE — Telephone Encounter (Signed)
Pt called to req a rx for shingles  And would like to pick up the rx here from the office ///

## 2014-04-02 NOTE — Telephone Encounter (Signed)
Left message on voicemail to call office.  

## 2014-04-03 NOTE — Telephone Encounter (Signed)
Pt would like to take rx for shingle vaccine to [facility].

## 2014-04-04 MED ORDER — ZOSTER VACCINE LIVE 19400 UNT/0.65ML ~~LOC~~ SOLR: 0.6500 mL | Freq: Once | SUBCUTANEOUS | Status: DC

## 2014-04-04 NOTE — Telephone Encounter (Signed)
Pt notified Rx ready for pickup for Shingles vaccine.

## 2014-04-18 ENCOUNTER — Other Ambulatory Visit (HOSPITAL_COMMUNITY): Payer: Self-pay | Admitting: Internal Medicine

## 2014-09-06 ENCOUNTER — Encounter: Payer: Self-pay | Admitting: Internal Medicine

## 2014-09-24 ENCOUNTER — Other Ambulatory Visit (HOSPITAL_COMMUNITY): Payer: Self-pay | Admitting: Internal Medicine

## 2014-09-24 NOTE — Telephone Encounter (Signed)
Rx was sent to pharmacy electronically. 

## 2014-10-04 ENCOUNTER — Other Ambulatory Visit: Payer: Self-pay

## 2014-10-22 ENCOUNTER — Other Ambulatory Visit (HOSPITAL_COMMUNITY): Payer: Self-pay | Admitting: Internal Medicine

## 2014-10-23 NOTE — Telephone Encounter (Signed)
Rx was sent to pharmacy electronically. 

## 2014-11-04 ENCOUNTER — Other Ambulatory Visit (HOSPITAL_COMMUNITY): Payer: Self-pay | Admitting: Internal Medicine

## 2014-11-05 ENCOUNTER — Other Ambulatory Visit: Payer: Self-pay | Admitting: Dermatology

## 2014-11-05 DIAGNOSIS — T148 Other injury of unspecified body region: Secondary | ICD-10-CM | POA: Diagnosis not present

## 2014-11-05 DIAGNOSIS — L308 Other specified dermatitis: Secondary | ICD-10-CM | POA: Diagnosis not present

## 2014-11-05 DIAGNOSIS — L986 Other infiltrative disorders of the skin and subcutaneous tissue: Secondary | ICD-10-CM | POA: Diagnosis not present

## 2014-11-05 DIAGNOSIS — Z85828 Personal history of other malignant neoplasm of skin: Secondary | ICD-10-CM | POA: Diagnosis not present

## 2014-11-05 DIAGNOSIS — L910 Hypertrophic scar: Secondary | ICD-10-CM | POA: Diagnosis not present

## 2014-11-05 NOTE — Telephone Encounter (Signed)
Rx was sent to pharmacy electronically. LM that patient needs appmt

## 2014-11-19 DIAGNOSIS — L603 Nail dystrophy: Secondary | ICD-10-CM | POA: Diagnosis not present

## 2014-11-19 DIAGNOSIS — D1801 Hemangioma of skin and subcutaneous tissue: Secondary | ICD-10-CM | POA: Diagnosis not present

## 2014-11-19 DIAGNOSIS — Z85828 Personal history of other malignant neoplasm of skin: Secondary | ICD-10-CM | POA: Diagnosis not present

## 2014-11-19 DIAGNOSIS — L308 Other specified dermatitis: Secondary | ICD-10-CM | POA: Diagnosis not present

## 2014-11-22 ENCOUNTER — Encounter: Payer: Medicare Other | Admitting: Internal Medicine

## 2014-11-22 ENCOUNTER — Encounter: Payer: Self-pay | Admitting: Internal Medicine

## 2014-11-22 ENCOUNTER — Ambulatory Visit (INDEPENDENT_AMBULATORY_CARE_PROVIDER_SITE_OTHER): Payer: Medicare Other | Admitting: Internal Medicine

## 2014-11-22 VITALS — BP 136/80 | HR 79 | Ht 67.0 in | Wt 199.4 lb

## 2014-11-22 DIAGNOSIS — I6523 Occlusion and stenosis of bilateral carotid arteries: Secondary | ICD-10-CM

## 2014-11-22 DIAGNOSIS — Z23 Encounter for immunization: Secondary | ICD-10-CM | POA: Diagnosis not present

## 2014-11-22 DIAGNOSIS — I739 Peripheral vascular disease, unspecified: Secondary | ICD-10-CM | POA: Diagnosis not present

## 2014-11-22 DIAGNOSIS — I6529 Occlusion and stenosis of unspecified carotid artery: Secondary | ICD-10-CM | POA: Insufficient documentation

## 2014-11-22 DIAGNOSIS — E785 Hyperlipidemia, unspecified: Secondary | ICD-10-CM

## 2014-11-22 NOTE — Patient Instructions (Signed)
Your physician wants you to follow-up in: 1 year with Dr. Debara Pickett. You will receive a reminder letter in the mail two months in advance. If you don't receive a letter, please call our office to schedule the follow-up appointment.  Please have a carotid doppler study in 1 year, prior to your next office visit.   Ask Dr. Burnice Logan about Combivent - for productive cough

## 2014-11-22 NOTE — Progress Notes (Signed)
OFFICE NOTE  Chief Complaint:  Routine follow-up  Primary Care Physician: Nyoka Cowden, MD  HPI:  Mary Summers  is a 80 year old female who has done generally very well. Her husband also is a patient of mine. Actually, last year she had undergone a [employer] mobile screening test which showed mild carotid artery disease and low Doppler velocities bilaterally in the carotids. She also has some increased risk for osteoporosis and dyslipidemia with an elevated total cholesterol of 212, LDL of 136 at the time. She was started on low dose pravastatin and a comprehensive lipid profile was obtained including NMR. Her NMR demonstrated actually high particle number relative to her LDL cholesterol 1736 with a calculated LDL of 148, HDL of 64 and high small LDL particle number 536. Overall, an atherogenic lipid profile. I recommended then changing to a moderate to high intensity statin, specifically atorvastatin 40 mg daily. She did make that switch, seems to be tolerating the atorvastatin without any significant myalgias or any other problems with medications. In December, she underwent left hip replacement by Dr. Wynelle Link and is doing very well with increased activity and mobility due to that. Overall not complaining of any cardiac symptoms including shortness of breath, palpitations, chest pain, presyncope or other syncopal symptoms.   No new events are noted since I last saw her. She continues to take her cholesterol medicine is due for recheck to her primary care provider in a couple of weeks. She does report some nocturnal productive cough and was told before that she may have slight overinflation of her lungs. I wonder she has some element of chronic bronchitis or asthmatic bronchitis.  PMHx:  Past Medical History  Diagnosis Date  . Seasonal allergies   . COPD (chronic obstructive pulmonary disease)     PT HAS BEEN TOLD COPD--BUT SHE DOES NOT NOTICE SOB--HX OF EXPOSURE TO CHEMICALS  SUCH AS LACQUERS FOR FURNITURE REFINISHING--NEVER SMOKED  . Arthritis   . Peripheral vascular disease     MILD CAROTID DISEASE BY DOPPLER STUDY  11/13/12. -REPORT FROM DR. Jissell [facility]  . Hyperlipidemia     Past Surgical History  Procedure Laterality Date  . Congenitally absent uterus    . Breast surgery      BREAST BIOPSIES X 2  - NO CANCER  . Laser vocal cord for nodule  1985    . Total hip arthroplasty  11/29/2012    Procedure: TOTAL HIP ARTHROPLASTY ANTERIOR APPROACH;  Surgeon: Gearlean Alf, MD;  Location: [facility];  Service: Orthopedics;  Laterality: Left;  . Doppler echocardiography      FAMHx:  History reviewed. No pertinent family history.  SOCHx:   reports that she has never smoked. She has never used smokeless tobacco. She reports that she drinks alcohol. She reports that she does not use illicit drugs.  ALLERGIES:  No Known Allergies  ROS: A comprehensive review of systems was negative except for: Respiratory: positive for cough  HOME MEDS: Current Outpatient Prescriptions  Medication Sig Dispense Refill  . atorvastatin (LIPITOR) 40 MG tablet TAKE 1 TABLET (40 MG TOTAL) BY MOUTH DAILY AT 6 PM. NEED APPOINTMENT FOR FUTURE REFILLS 30 tablet 0  . ciclopirox (PENLAC) 8 % solution Apply topically at bedtime. Apply over nail and surrounding skin. Apply daily over previous coat. After seven (7) days, may remove with alcohol and continue cycle. 6.6 mL 0  . fluticasone (FLONASE) 50 MCG/ACT nasal spray Place 1 spray into both nostrils daily as  needed for allergies or rhinitis. ONCE A DAY IF NEEDED 16 g 2  . Multiple Vitamin (MULTIVITAMIN) tablet Take 1 tablet by mouth daily.      No current facility-administered medications for this visit.    LABS/IMAGING: No results found for this or any previous visit (from the past 48 hour(s)). No results found.  VITALS: BP 136/80 mmHg  Pulse 79  Ht 5\' 7"  (1.702 m)  Wt 199 lb 6.4 oz (90.447 kg)  BMI  31.22 kg/m2  EXAM: General appearance: alert and no distress Lungs: clear to auscultation bilaterally Heart: regular rate and rhythm Extremities: extremities normal, atraumatic, no cyanosis or edema  EKG: Normal sinus rhythm at 79  ASSESSMENT: 1. Dyslipidemia 2. Mild bilateral carotid artery disease 3. Productive nocturnal cough  PLAN: 1.   Mary Summers is doing quite well. She has however had some weight gain as with her husband and they both need to work on weight loss. Her cholesterol will be reassessed by primary care physician in a few weeks. She does have mild carotid artery disease bilaterally which will need reassessment in one year prior to my next visit. She is complaining of some mild productive nocturnal cough which could be related to an asthmatic bronchitis or chronic bronchitis. She may need to consider an inhaler such as Combivent. I will defer this to her primary care provider. Plan to see her back annually.  Pixie Casino, MD, [facility] Attending Cardiologist [facility]  Mary Summers 11/22/2014, 2:50 PM

## 2014-12-12 ENCOUNTER — Encounter: Payer: Self-pay | Admitting: Internal Medicine

## 2014-12-12 ENCOUNTER — Ambulatory Visit (INDEPENDENT_AMBULATORY_CARE_PROVIDER_SITE_OTHER): Payer: Medicare Other | Admitting: Internal Medicine

## 2014-12-12 VITALS — BP 140/88 | HR 105 | Temp 98.0°F | Resp 20 | Ht 67.0 in | Wt 194.0 lb

## 2014-12-12 DIAGNOSIS — I739 Peripheral vascular disease, unspecified: Secondary | ICD-10-CM

## 2014-12-12 DIAGNOSIS — E785 Hyperlipidemia, unspecified: Secondary | ICD-10-CM

## 2014-12-12 DIAGNOSIS — Z0000 Encounter for general adult medical examination without abnormal findings: Secondary | ICD-10-CM

## 2014-12-12 DIAGNOSIS — M160 Bilateral primary osteoarthritis of hip: Secondary | ICD-10-CM | POA: Diagnosis not present

## 2014-12-12 LAB — CBC WITH DIFFERENTIAL/PLATELET
BASOS PCT: 0.1 % (ref 0.0–3.0)
Basophils Absolute: 0 10*3/uL (ref 0.0–0.1)
Eosinophils Absolute: 0.2 10*3/uL (ref 0.0–0.7)
Eosinophils Relative: 2.8 % (ref 0.0–5.0)
HEMATOCRIT: 45.9 % (ref 36.0–46.0)
HEMOGLOBIN: 15 g/dL (ref 12.0–15.0)
LYMPHS PCT: 20.7 % (ref 12.0–46.0)
Lymphs Abs: 1.6 10*3/uL (ref 0.7–4.0)
MCHC: 32.8 g/dL (ref 30.0–36.0)
MCV: 89.8 fl (ref 78.0–100.0)
MONOS PCT: 6.6 % (ref 3.0–12.0)
Monocytes Absolute: 0.5 10*3/uL (ref 0.1–1.0)
NEUTROS ABS: 5.4 10*3/uL (ref 1.4–7.7)
Neutrophils Relative %: 69.8 % (ref 43.0–77.0)
Platelets: 196 10*3/uL (ref 150.0–400.0)
RBC: 5.11 Mil/uL (ref 3.87–5.11)
RDW: 12.8 % (ref 11.5–15.5)
WBC: 7.7 10*3/uL (ref 4.0–10.5)

## 2014-12-12 LAB — COMPREHENSIVE METABOLIC PANEL
ALT: 24 U/L (ref 0–35)
AST: 25 U/L (ref 0–37)
Albumin: 4.4 g/dL (ref 3.5–5.2)
Alkaline Phosphatase: 82 U/L (ref 39–117)
BILIRUBIN TOTAL: 0.8 mg/dL (ref 0.2–1.2)
BUN: 12 mg/dL (ref 6–23)
CALCIUM: 9.4 mg/dL (ref 8.4–10.5)
CHLORIDE: 105 meq/L (ref 96–112)
CO2: 26 meq/L (ref 19–32)
CREATININE: 0.6 mg/dL (ref 0.4–1.2)
GFR: 112.88 mL/min (ref >60.00)
Glucose, Bld: 86 mg/dL (ref 70–99)
Potassium: 3.9 mEq/L (ref 3.5–5.1)
Sodium: 140 mEq/L (ref 135–145)
Total Protein: 7.4 g/dL (ref 6.0–8.3)

## 2014-12-12 LAB — LIPID PANEL
CHOL/HDL RATIO: 3
Cholesterol: 168 mg/dL (ref 0–200)
HDL: 51.8 mg/dL (ref >39.00)
LDL Cholesterol: 92 mg/dL (ref 0–99)
NonHDL: 116.2
TRIGLYCERIDES: 121 mg/dL (ref 0.0–149.0)
VLDL: 24.2 mg/dL (ref 0.0–40.0)

## 2014-12-12 LAB — TSH: TSH: 3.69 u[IU]/mL (ref 0.35–4.50)

## 2014-12-12 NOTE — Progress Notes (Signed)
Subjective:    Patient ID: Mary Summers, female    DOB: Apr 29, 1932, 80 y.o.   MRN: [id number]  HPI  Pre-visit discussion using our clinic review tool. No additional management support is needed unless otherwise documented below in the visit note.  52 -year-old patient who is seen today for a preventive health examination. She has history of dyslipidemia which is well-controlled on atorvastatin. She has osteoarthritis and is about 1 year status post total hip replacement surgery. She has done quite well. No cardiopulmonary complaints. She does have a cardiologist. Denies any cardiopulmonary complaints.  Past Medical History  Diagnosis Date  . Seasonal allergies   . COPD (chronic obstructive pulmonary disease)     PT HAS BEEN TOLD COPD--BUT SHE DOES NOT NOTICE SOB--HX OF EXPOSURE TO CHEMICALS SUCH AS LACQUERS FOR FURNITURE REFINISHING--NEVER SMOKED  . Arthritis   . Peripheral vascular disease     MILD CAROTID DISEASE BY DOPPLER STUDY  11/13/12. -REPORT FROM DR. HILTY[facility]  . Hyperlipidemia     History   Social History  . Marital Status: Married    Spouse Name: N/A    Number of Children: N/A  . Years of Education: N/A   Occupational History  . Not on file.   Social History Main Topics  . Smoking status: Never Smoker   . Smokeless tobacco: Never Used  . Alcohol Use: Yes     Comment: OCCAS ALCOHOL  . Drug Use: No  . Sexual Activity: Not on file   Other Topics Concern  . Not on file   Social History Narrative    Past Surgical History  Procedure Laterality Date  . Congenitally absent uterus    . Breast surgery      BREAST BIOPSIES X 2  - NO CANCER  . Laser vocal cord for nodule  1985    . Total hip arthroplasty  11/29/2012    Procedure: TOTAL HIP ARTHROPLASTY ANTERIOR APPROACH;  Surgeon: Gearlean Alf, MD;  Location: [facility];  Service: Orthopedics;  Laterality: Left;  . Doppler echocardiography      No family history on  file.  No Known Allergies  Current Outpatient Prescriptions on File Prior to Visit  Medication Sig Dispense Refill  . atorvastatin (LIPITOR) 40 MG tablet TAKE 1 TABLET (40 MG TOTAL) BY MOUTH DAILY AT 6 PM. NEED APPOINTMENT FOR FUTURE REFILLS 30 tablet 0  . ciclopirox (PENLAC) 8 % solution Apply topically at bedtime. Apply over nail and surrounding skin. Apply daily over previous coat. After seven (7) days, may remove with alcohol and continue cycle. 6.6 mL 0  . fluticasone (FLONASE) 50 MCG/ACT nasal spray Place 1 spray into both nostrils daily as needed for allergies or rhinitis. ONCE A DAY IF NEEDED 16 g 2  . Multiple Vitamin (MULTIVITAMIN) tablet Take 1 tablet by mouth daily.      No current facility-administered medications on file prior to visit.    BP 140/88 mmHg  Pulse 105  Temp(Src) 98 F (36.7 C) (Oral)  Resp 20  Ht 5\' 7"  (1.702 m)  Wt 194 lb (87.998 kg)  BMI 30.38 kg/m2  SpO2 98%       Review of Systems  Constitutional: Negative for fever, appetite change, fatigue and unexpected weight change.  HENT: Negative for congestion, dental problem, ear pain, hearing loss, mouth sores, nosebleeds, sinus pressure, sore throat, tinnitus, trouble swallowing and voice change.   Eyes: Negative for photophobia, pain, redness and visual disturbance.  Respiratory:  Positive for cough. Negative for chest tightness and shortness of breath.        Chronic cough for several months  Cardiovascular: Negative for chest pain, palpitations and leg swelling.  Gastrointestinal: Negative for nausea, vomiting, abdominal pain, diarrhea, constipation, blood in stool, abdominal distention and rectal pain.  Genitourinary: Negative for dysuria, urgency, frequency, hematuria, flank pain, vaginal bleeding, vaginal discharge, difficulty urinating, genital sores, vaginal pain, menstrual problem and pelvic pain.  Musculoskeletal: Positive for back pain. Negative for arthralgias and neck stiffness.  Skin:  Negative for rash.  Neurological: Negative for dizziness, syncope, speech difficulty, weakness, light-headedness, numbness and headaches.       Decreased inspiratory sensation lower extremities. Intact monofilament testing  Hematological: Negative for adenopathy. Does not bruise/bleed easily.  Psychiatric/Behavioral: Negative for suicidal ideas, behavioral problems, self-injury, dysphoric mood and agitation. The patient is not nervous/anxious.        Objective:   Physical Exam  Constitutional: She is oriented to person, place, and time. She appears well-developed and well-nourished.  HENT:  Head: Normocephalic and atraumatic.  Right Ear: External ear normal.  Left Ear: External ear normal.  Mouth/Throat: Oropharynx is clear and moist.  Eyes: Conjunctivae and EOM are normal.  Neck: Normal range of motion. Neck supple. No JVD present. No thyromegaly present.  Cardiovascular: Normal rate, regular rhythm, normal heart sounds and intact distal pulses.   No murmur heard. Pulmonary/Chest: Effort normal and breath sounds normal. She has no wheezes. She has no rales.  Abdominal: Soft. Bowel sounds are normal. She exhibits no distension and no mass. There is no tenderness. There is no rebound and no guarding.  Musculoskeletal: Normal range of motion. She exhibits no edema or tenderness.  Neurological: She is alert and oriented to person, place, and time. She has normal reflexes. No cranial nerve deficit. She exhibits normal muscle tone. Coordination normal.  Skin: Skin is warm and dry. No rash noted.  Psychiatric: She has a normal mood and affect. Her behavior is normal.          Assessment & Plan:   Preventive health examination Osteoarthritis  dyslipidemia. Continue atorvastatin  We'll review laboratory update Recheck one year

## 2014-12-12 NOTE — Progress Notes (Signed)
Pre visit review using our clinic review tool, if applicable. No additional management support is needed unless otherwise documented below in the visit note. 

## 2014-12-12 NOTE — Patient Instructions (Signed)
Limit your sodium (Salt) intake    It is important that you exercise regularly, at least 20 minutes 3 to 4 times per week.  If you develop chest pain or shortness of breath seek  medical attention.  You need to lose weight.  Consider a lower calorie diet and regular exercise.  Health Maintenance Adopting a healthy lifestyle and getting preventive care can go a long way to promote health and wellness. Talk with your health care provider about what schedule of regular examinations is right for you. This is a good chance for you to check in with your provider about disease prevention and staying healthy. In between checkups, there are plenty of things you can do on your own. Experts have done a lot of research about which lifestyle changes and preventive measures are most likely to keep you healthy. Ask your health care provider for more information. WEIGHT AND DIET  Eat a healthy diet  Be sure to include plenty of vegetables, fruits, low-fat dairy products, and lean protein.  Do not eat a lot of foods high in solid fats, added sugars, or salt.  Get regular exercise. This is one of the most important things you can do for your health.  Most adults should exercise for at least 150 minutes each week. The exercise should increase your heart rate and make you sweat (moderate-intensity exercise).  Most adults should also do strengthening exercises at least twice a week. This is in addition to the moderate-intensity exercise.  Maintain a healthy weight  Body mass index (BMI) is a measurement that can be used to identify possible weight problems. It estimates body fat based on height and weight. Your health care provider can help determine your BMI and help you achieve or maintain a healthy weight.  For females 20 years of age and older:   A BMI below 18.5 is considered underweight.  A BMI of 18.5 to 24.9 is normal.  A BMI of 25 to 29.9 is considered overweight.  A BMI of 30 and above is  considered obese.  Watch levels of cholesterol and blood lipids  You should start having your blood tested for lipids and cholesterol at 78 years of age, then have this test every 5 years.  You may need to have your cholesterol levels checked more often if:  Your lipid or cholesterol levels are high.  You are older than 78 years of age.  You are at high risk for heart disease.  CANCER SCREENING   Lung Cancer  Lung cancer screening is recommended for adults 55-80 years old who are at high risk for lung cancer because of a history of smoking.  A yearly low-dose CT scan of the lungs is recommended for people who:  Currently smoke.  Have quit within the past 15 years.  Have at least a 30-pack-year history of smoking. A pack year is smoking an average of one pack of cigarettes a day for 1 year.  Yearly screening should continue until it has been 15 years since you quit.  Yearly screening should stop if you develop a health problem that would prevent you from having lung cancer treatment.  Breast Cancer  Practice breast self-awareness. This means understanding how your breasts normally appear and feel.  It also means doing regular breast self-exams. Let your health care provider know about any changes, no matter how small.  If you are in your 20s or 30s, you should have a clinical breast exam (CBE) by a health care   provider every 1-3 years as part of a regular health exam.  If you are 57 or older, have a CBE every year. Also consider having a breast X-ray (mammogram) every year.  If you have a family history of breast cancer, talk to your health care provider about genetic screening.  If you are at high risk for breast cancer, talk to your health care provider about having an MRI and a mammogram every year.  Breast cancer gene (BRCA) assessment is recommended for women who have family members with BRCA-related cancers. BRCA-related cancers  include:  Breast.  Ovarian.  Tubal.  Peritoneal cancers.  Results of the assessment will determine the need for genetic counseling and BRCA1 and BRCA2 testing. Cervical Cancer Routine pelvic examinations to screen for cervical cancer are no longer recommended for nonpregnant women who are considered low risk for cancer of the pelvic organs (ovaries, uterus, and vagina) and who do not have symptoms. A pelvic examination may be necessary if you have symptoms including those associated with pelvic infections. Ask your health care provider if a screening pelvic exam is right for you.   The Pap test is the screening test for cervical cancer for women who are considered at risk.  If you had a hysterectomy for a problem that was not cancer or a condition that could lead to cancer, then you no longer need Pap tests.  If you are older than 65 years, and you have had normal Pap tests for the past 10 years, you no longer need to have Pap tests.  If you have had past treatment for cervical cancer or a condition that could lead to cancer, you need Pap tests and screening for cancer for at least 20 years after your treatment.  If you no longer get a Pap test, assess your risk factors if they change (such as having a new sexual partner). This can affect whether you should start being screened again.  Some women have medical problems that increase their chance of getting cervical cancer. If this is the case for you, your health care provider may recommend more frequent screening and Pap tests.  The human papillomavirus (HPV) test is another test that may be used for cervical cancer screening. The HPV test looks for the virus that can cause cell changes in the cervix. The cells collected during the Pap test can be tested for HPV.  The HPV test can be used to screen women 24 years of age and older. Getting tested for HPV can extend the interval between normal Pap tests from three to five years.  An HPV  test also should be used to screen women of any age who have unclear Pap test results.  After 78 years of age, women should have HPV testing as often as Pap tests.  Colorectal Cancer  This type of cancer can be detected and often prevented.  Routine colorectal cancer screening usually begins at 78 years of age and continues through 78 years of age.  Your health care provider may recommend screening at an earlier age if you have risk factors for colon cancer.  Your health care provider may also recommend using home test kits to check for hidden blood in the stool.  A small camera at the end of a tube can be used to examine your colon directly (sigmoidoscopy or colonoscopy). This is done to check for the earliest forms of colorectal cancer.  Routine screening usually begins at age 22.  Direct examination of the colon  should be repeated every 5-10 years through 78 years of age. However, you may need to be screened more often if early forms of precancerous polyps or small growths are found. Skin Cancer  Check your skin from head to toe regularly.  Tell your health care provider about any new moles or changes in moles, especially if there is a change in a mole's shape or color.  Also tell your health care provider if you have a mole that is larger than the size of a pencil eraser.  Always use sunscreen. Apply sunscreen liberally and repeatedly throughout the day.  Protect yourself by wearing long sleeves, pants, a wide-brimmed hat, and sunglasses whenever you are outside. HEART DISEASE, DIABETES, AND HIGH BLOOD PRESSURE   Have your blood pressure checked at least every 1-2 years. High blood pressure causes heart disease and increases the risk of stroke.  If you are between 55 years and 79 years old, ask your health care provider if you should take aspirin to prevent strokes.  Have regular diabetes screenings. This involves taking a blood sample to check your fasting blood sugar  level.  If you are at a normal weight and have a low risk for diabetes, have this test once every three years after 78 years of age.  If you are overweight and have a high risk for diabetes, consider being tested at a younger age or more often. PREVENTING INFECTION  Hepatitis B  If you have a higher risk for hepatitis B, you should be screened for this virus. You are considered at high risk for hepatitis B if:  You were born in a country where hepatitis B is common. Ask your health care provider which countries are considered high risk.  Your parents were born in a high-risk country, and you have not been immunized against hepatitis B (hepatitis B vaccine).  You have HIV or AIDS.  You use needles to inject street drugs.  You live with someone who has hepatitis B.  You have had sex with someone who has hepatitis B.  You get hemodialysis treatment.  You take certain medicines for conditions, including cancer, organ transplantation, and autoimmune conditions. Hepatitis C  Blood testing is recommended for:  Everyone born from 1945 through 1965.  Anyone with known risk factors for hepatitis C. Sexually transmitted infections (STIs)  You should be screened for sexually transmitted infections (STIs) including gonorrhea and chlamydia if:  You are sexually active and are younger than 78 years of age.  You are older than 78 years of age and your health care provider tells you that you are at risk for this type of infection.  Your sexual activity has changed since you were last screened and you are at an increased risk for chlamydia or gonorrhea. Ask your health care provider if you are at risk.  If you do not have HIV, but are at risk, it may be recommended that you take a prescription medicine daily to prevent HIV infection. This is called pre-exposure prophylaxis (PrEP). You are considered at risk if:  You are sexually active and do not regularly use condoms or know the HIV status  of your partner(s).  You take drugs by injection.  You are sexually active with a partner who has HIV. Talk with your health care provider about whether you are at high risk of being infected with HIV. If you choose to begin PrEP, you should first be tested for HIV. You should then be tested every 3 months for   as long as you are taking PrEP.  PREGNANCY   If you are premenopausal and you may become pregnant, ask your health care provider about preconception counseling.  If you may become pregnant, take 400 to 800 micrograms (mcg) of folic acid every day.  If you want to prevent pregnancy, talk to your health care provider about birth control (contraception). OSTEOPOROSIS AND MENOPAUSE   Osteoporosis is a disease in which the bones lose minerals and strength with aging. This can result in serious bone fractures. Your risk for osteoporosis can be identified using a bone density scan.  If you are 65 years of age or older, or if you are at risk for osteoporosis and fractures, ask your health care provider if you should be screened.  Ask your health care provider whether you should take a calcium or vitamin D supplement to lower your risk for osteoporosis.  Menopause may have certain physical symptoms and risks.  Hormone replacement therapy may reduce some of these symptoms and risks. Talk to your health care provider about whether hormone replacement therapy is right for you.  HOME CARE INSTRUCTIONS   Schedule regular health, dental, and eye exams.  Stay current with your immunizations.   Do not use any tobacco products including cigarettes, chewing tobacco, or electronic cigarettes.  If you are pregnant, do not drink alcohol.  If you are breastfeeding, limit how much and how often you drink alcohol.  Limit alcohol intake to no more than 1 drink per day for nonpregnant women. One drink equals 12 ounces of beer, 5 ounces of wine, or 1 ounces of hard liquor.  Do not use street  drugs.  Do not share needles.  Ask your health care provider for help if you need support or information about quitting drugs.  Tell your health care provider if you often feel depressed.  Tell your health care provider if you have ever been abused or do not feel safe at home. Document Released: 06/21/2011 Document Revised: 04/22/2014 Document Reviewed: 11/07/2013 [employer] Patient Information 2015 [employer]. This information is not intended to replace advice given to you by your health care provider. Make sure you discuss any questions you have with your health care provider.  

## 2015-02-17 ENCOUNTER — Telehealth: Payer: Self-pay | Admitting: Internal Medicine

## 2015-02-17 MED ORDER — ATORVASTATIN CALCIUM 40 MG PO TABS: 40.0000 mg | ORAL_TABLET | Freq: Every day | ORAL | Status: DC

## 2015-02-17 NOTE — Telephone Encounter (Signed)
Rx(s) sent to pharmacy electronically. Left VM that med was refilled.

## 2015-02-17 NOTE — Telephone Encounter (Signed)
°  1. Which medications need to be refilled? Atorvastatin 2. Which pharmacy is medication to be sent to?[facility]--[phone]  3. Do they need a 30 day or 90 day supply? 90 and refills  4. Would they like a call back once the medication has been sent to the pharmacy? yes vas

## 2015-03-04 DIAGNOSIS — H04123 Dry eye syndrome of bilateral lacrimal glands: Secondary | ICD-10-CM | POA: Diagnosis not present

## 2015-03-24 ENCOUNTER — Other Ambulatory Visit: Payer: Self-pay | Admitting: Dermatology

## 2015-03-24 DIAGNOSIS — C44619 Basal cell carcinoma of skin of left upper limb, including shoulder: Secondary | ICD-10-CM | POA: Diagnosis not present

## 2015-03-24 DIAGNOSIS — L821 Other seborrheic keratosis: Secondary | ICD-10-CM | POA: Diagnosis not present

## 2015-03-24 DIAGNOSIS — C44519 Basal cell carcinoma of skin of other part of trunk: Secondary | ICD-10-CM | POA: Diagnosis not present

## 2015-03-24 DIAGNOSIS — D224 Melanocytic nevi of scalp and neck: Secondary | ICD-10-CM | POA: Diagnosis not present

## 2015-03-24 DIAGNOSIS — L218 Other seborrheic dermatitis: Secondary | ICD-10-CM | POA: Diagnosis not present

## 2015-03-24 DIAGNOSIS — L858 Other specified epidermal thickening: Secondary | ICD-10-CM | POA: Diagnosis not present

## 2015-03-24 DIAGNOSIS — Z85828 Personal history of other malignant neoplasm of skin: Secondary | ICD-10-CM | POA: Diagnosis not present

## 2015-03-24 DIAGNOSIS — L910 Hypertrophic scar: Secondary | ICD-10-CM | POA: Diagnosis not present

## 2015-03-24 DIAGNOSIS — D0462 Carcinoma in situ of skin of left upper limb, including shoulder: Secondary | ICD-10-CM | POA: Diagnosis not present

## 2015-03-24 DIAGNOSIS — C44629 Squamous cell carcinoma of skin of left upper limb, including shoulder: Secondary | ICD-10-CM | POA: Diagnosis not present

## 2015-04-03 DIAGNOSIS — Z85828 Personal history of other malignant neoplasm of skin: Secondary | ICD-10-CM | POA: Diagnosis not present

## 2015-04-03 DIAGNOSIS — L910 Hypertrophic scar: Secondary | ICD-10-CM | POA: Diagnosis not present

## 2015-05-16 DIAGNOSIS — L570 Actinic keratosis: Secondary | ICD-10-CM | POA: Diagnosis not present

## 2015-05-16 DIAGNOSIS — L910 Hypertrophic scar: Secondary | ICD-10-CM | POA: Diagnosis not present

## 2015-05-16 DIAGNOSIS — Z85828 Personal history of other malignant neoplasm of skin: Secondary | ICD-10-CM | POA: Diagnosis not present

## 2015-10-22 DIAGNOSIS — L814 Other melanin hyperpigmentation: Secondary | ICD-10-CM | POA: Diagnosis not present

## 2015-10-22 DIAGNOSIS — L910 Hypertrophic scar: Secondary | ICD-10-CM | POA: Diagnosis not present

## 2015-10-22 DIAGNOSIS — D225 Melanocytic nevi of trunk: Secondary | ICD-10-CM | POA: Diagnosis not present

## 2015-10-22 DIAGNOSIS — L218 Other seborrheic dermatitis: Secondary | ICD-10-CM | POA: Diagnosis not present

## 2015-10-22 DIAGNOSIS — Z85828 Personal history of other malignant neoplasm of skin: Secondary | ICD-10-CM | POA: Diagnosis not present

## 2015-10-22 DIAGNOSIS — L603 Nail dystrophy: Secondary | ICD-10-CM | POA: Diagnosis not present

## 2015-10-22 DIAGNOSIS — D0472 Carcinoma in situ of skin of left lower limb, including hip: Secondary | ICD-10-CM | POA: Diagnosis not present

## 2015-10-22 DIAGNOSIS — L820 Inflamed seborrheic keratosis: Secondary | ICD-10-CM | POA: Diagnosis not present

## 2015-12-05 DIAGNOSIS — Z23 Encounter for immunization: Secondary | ICD-10-CM | POA: Diagnosis not present

## 2015-12-16 ENCOUNTER — Encounter: Payer: Medicare Other | Admitting: Internal Medicine

## 2015-12-24 ENCOUNTER — Ambulatory Visit (INDEPENDENT_AMBULATORY_CARE_PROVIDER_SITE_OTHER): Payer: Medicare Other | Admitting: Internal Medicine

## 2015-12-24 ENCOUNTER — Encounter: Payer: Self-pay | Admitting: Internal Medicine

## 2015-12-24 VITALS — BP 130/80 | HR 106 | Temp 97.9°F | Resp 20 | Ht 67.0 in | Wt 198.0 lb

## 2015-12-24 DIAGNOSIS — E785 Hyperlipidemia, unspecified: Secondary | ICD-10-CM | POA: Diagnosis not present

## 2015-12-24 DIAGNOSIS — R05 Cough: Secondary | ICD-10-CM | POA: Diagnosis not present

## 2015-12-24 DIAGNOSIS — Z0000 Encounter for general adult medical examination without abnormal findings: Secondary | ICD-10-CM | POA: Diagnosis not present

## 2015-12-24 DIAGNOSIS — I739 Peripheral vascular disease, unspecified: Secondary | ICD-10-CM

## 2015-12-24 DIAGNOSIS — M160 Bilateral primary osteoarthritis of hip: Secondary | ICD-10-CM

## 2015-12-24 DIAGNOSIS — I868 Varicose veins of other specified sites: Secondary | ICD-10-CM

## 2015-12-24 DIAGNOSIS — R053 Chronic cough: Secondary | ICD-10-CM

## 2015-12-24 DIAGNOSIS — I8390 Asymptomatic varicose veins of unspecified lower extremity: Secondary | ICD-10-CM

## 2015-12-24 LAB — CBC WITH DIFFERENTIAL/PLATELET
BASOS PCT: 0.7 % (ref 0.0–3.0)
Basophils Absolute: 0 10*3/uL (ref 0.0–0.1)
EOS PCT: 3.7 % (ref 0.0–5.0)
Eosinophils Absolute: 0.2 10*3/uL (ref 0.0–0.7)
HEMATOCRIT: 45.8 % (ref 36.0–46.0)
HEMOGLOBIN: 15.1 g/dL — AB (ref 12.0–15.0)
LYMPHS PCT: 26 % (ref 12.0–46.0)
Lymphs Abs: 1.6 10*3/uL (ref 0.7–4.0)
MCHC: 32.8 g/dL (ref 30.0–36.0)
MCV: 89.8 fl (ref 78.0–100.0)
MONO ABS: 0.6 10*3/uL (ref 0.1–1.0)
Monocytes Relative: 9 % (ref 3.0–12.0)
Neutro Abs: 3.7 10*3/uL (ref 1.4–7.7)
Neutrophils Relative %: 60.6 % (ref 43.0–77.0)
Platelets: 203 10*3/uL (ref 150.0–400.0)
RBC: 5.1 Mil/uL (ref 3.87–5.11)
RDW: 12.7 % (ref 11.5–15.5)
WBC: 6.1 10*3/uL (ref 4.0–10.5)

## 2015-12-24 LAB — COMPREHENSIVE METABOLIC PANEL
ALBUMIN: 4.2 g/dL (ref 3.5–5.2)
ALK PHOS: 88 U/L (ref 39–117)
ALT: 19 U/L (ref 0–35)
AST: 20 U/L (ref 0–37)
BUN: 14 mg/dL (ref 6–23)
CALCIUM: 9.6 mg/dL (ref 8.4–10.5)
CHLORIDE: 104 meq/L (ref 96–112)
CO2: 28 mEq/L (ref 19–32)
Creatinine, Ser: 0.63 mg/dL (ref 0.40–1.20)
GFR: 96.26 mL/min (ref >60.00)
Glucose, Bld: 92 mg/dL (ref 70–99)
POTASSIUM: 4.2 meq/L (ref 3.5–5.1)
SODIUM: 142 meq/L (ref 135–145)
TOTAL PROTEIN: 6.9 g/dL (ref 6.0–8.3)
Total Bilirubin: 0.6 mg/dL (ref 0.2–1.2)

## 2015-12-24 LAB — LIPID PANEL
CHOLESTEROL: 174 mg/dL (ref 0–200)
HDL: 59.5 mg/dL (ref >39.00)
LDL CALC: 89 mg/dL (ref 0–99)
NonHDL: 114.92
TRIGLYCERIDES: 131 mg/dL (ref 0.0–149.0)
Total CHOL/HDL Ratio: 3
VLDL: 26.2 mg/dL (ref 0.0–40.0)

## 2015-12-24 LAB — TSH: TSH: 3.16 u[IU]/mL (ref 0.35–4.50)

## 2015-12-24 MED ORDER — TERBINAFINE HCL 250 MG PO TABS: 250.0000 mg | ORAL_TABLET | Freq: Every day | ORAL | Status: DC

## 2015-12-24 NOTE — Progress Notes (Signed)
Subjective:    Patient ID: Mary Summers, female    DOB: [birth year], 82 y.o.   MRN: [id number]  HPI  Pre-visit discussion using our clinic review tool. No additional management support is needed unless otherwise documented below in the visit note.  7  -year-old patient who is seen today for a preventive health examination.  She has history of dyslipidemia which is well-controlled on atorvastatin. She has osteoarthritis and is about 2 year status post total hip replacement surgery. She has done quite well. No cardiopulmonary complaints. She does have a cardiologist. Denies any cardiopulmonary complaints.  Concerns today include toenail onychomycosis and varicose veins. She continues to have a mildly productive chronic cough.  This has now been present for 2 years.  Chest radiographs were reviewed.  The revealed stable chronic changes.  Past Medical History  Diagnosis Date  . Seasonal allergies   . COPD (chronic obstructive pulmonary disease) (HCC)     PT HAS BEEN TOLD COPD--BUT SHE DOES NOT NOTICE SOB--HX OF EXPOSURE TO CHEMICALS SUCH AS LACQUERS FOR FURNITURE REFINISHING--NEVER SMOKED  . Arthritis   . Peripheral vascular disease ([place])     MILD CAROTID DISEASE BY DOPPLER STUDY  11/13/12. -REPORT FROM [facility]  . Hyperlipidemia     Social History   Social History  . Marital Status: Married    Spouse Name: N/A  . Number of Children: N/A  . Years of Education: N/A   Occupational History  . Not on file.   Social History Main Topics  . Smoking status: Never Smoker   . Smokeless tobacco: Never Used  . Alcohol Use: Yes     Comment: OCCAS ALCOHOL  . Drug Use: No  . Sexual Activity: Not on file   Other Topics Concern  . Not on file   Social History Narrative    Past Surgical History  Procedure Laterality Date  . Congenitally absent uterus    . Breast surgery      BREAST BIOPSIES X 2  - NO CANCER  . Laser vocal cord for  nodule  1985    . Total hip arthroplasty  11/29/2012    Procedure: TOTAL HIP ARTHROPLASTY ANTERIOR APPROACH;  Surgeon: Gearlean Alf, MD;  Location: [facility];  Service: Orthopedics;  Laterality: Left;  . Doppler echocardiography      No family history on file.  No Known Allergies  Current Outpatient Prescriptions on File Prior to Visit  Medication Sig Dispense Refill  . atorvastatin (LIPITOR) 40 MG tablet Take 1 tablet (40 mg total) by mouth daily. 90 tablet 3  . ciclopirox (PENLAC) 8 % solution Apply topically at bedtime. Apply over nail and surrounding skin. Apply daily over previous coat. After seven (7) days, may remove with alcohol and continue cycle. 6.6 mL 0  . fluticasone (FLONASE) 50 MCG/ACT nasal spray Place 1 spray into both nostrils daily as needed for allergies or rhinitis. ONCE A DAY IF NEEDED 16 g 2  . Multiple Vitamin (MULTIVITAMIN) tablet Take 1 tablet by mouth daily.      No current facility-administered medications on file prior to visit.    BP 130/80 mmHg  Pulse 106  Temp(Src) 97.9 F (36.6 C) (Oral)  Resp 20  Ht 5\' 7"  (1.702 m)  Wt 198 lb (89.812 kg)  BMI 31.00 kg/m2  SpO2 97%   1. Risk factors, based on past  M,S,F history.  Current vascular risk factors include dyslipidemia  2.  Physical activities:  No rigorous physical activities, but quite active with travel in social activities  3.  Depression/mood: No history of major depression   4.  Hearing: History of mild chronic tinnitus but no hearing deficit   5.  ADL's: Independent   6.  Fall risk: Low  7.  Home safety:  No problems identified  8.  Height weight, and visual acuity;  height and weight stable no change in visual acuity  9.  Counseling:  More regular exercise, weight loss encouraged.  Mammogram encouraged  10. Lab orders based on risk factors:  Laboratory studies will be reviewed  11. Referral : Vascular vein specialist, mammogram  12. Care plan: Review chest x-ray in view of her  chronic cough  13. Cognitive assessment: Alert in order with normal affect.  No cognitive dysfunction  14. Screening: Patient provided with a written and personalized 5-10 year screening schedule in the [facility].  annual mammograms.  Encouraged.  Will continue to have annual clinical exams with screening lab  15. Provider List Update:  Primary care vascular surgery and radiology      Review of Systems  Constitutional: Negative for fever, appetite change, fatigue and unexpected weight change.  HENT: Positive for tinnitus. Negative for congestion, dental problem, ear pain, hearing loss, mouth sores, nosebleeds, sinus pressure, sore throat, trouble swallowing and voice change.   Eyes: Negative for photophobia, pain, redness and visual disturbance.  Respiratory: Positive for cough. Negative for chest tightness and shortness of breath.           Cardiovascular: Negative for chest pain, palpitations and leg swelling.  Gastrointestinal: Negative for nausea, vomiting, abdominal pain, diarrhea, constipation, blood in stool, abdominal distention and rectal pain.  Genitourinary: Negative for dysuria, urgency, frequency, hematuria, flank pain, vaginal bleeding, vaginal discharge, difficulty urinating, genital sores, vaginal pain, menstrual problem and pelvic pain.  Musculoskeletal: Positive for back pain. Negative for arthralgias and neck stiffness.  Skin: Negative for rash.  Neurological: Negative for dizziness, syncope, speech difficulty, weakness, light-headedness, numbness and headaches.          Hematological: Negative for adenopathy. Does not bruise/bleed easily.  Psychiatric/Behavioral: Negative for suicidal ideas, behavioral problems, self-injury, dysphoric mood and agitation. The patient is not nervous/anxious.        Objective:   Physical Exam  Constitutional: She is oriented to person, place, and time. She appears well-developed and well-nourished.  HENT:  Head: Normocephalic and  atraumatic.  Right Ear: External ear normal.  Left Ear: External ear normal.  Mouth/Throat: Oropharynx is clear and moist.  Eyes: Conjunctivae and EOM are normal.  Neck: Normal range of motion. Neck supple. No JVD present. No thyromegaly present.  Cardiovascular: Normal rate, regular rhythm, normal heart sounds and intact distal pulses.   No murmur heard. Pulmonary/Chest: Effort normal. She has no wheezes. She has rales.  Abdominal: Soft. Bowel sounds are normal. She exhibits no distension and no mass. There is no tenderness. There is no rebound and no guarding.  Genitourinary: Vagina normal.  Musculoskeletal: Normal range of motion. She exhibits no edema or tenderness.  Neurological: She is alert and oriented to person, place, and time. She has normal reflexes. No cranial nerve deficit. She exhibits normal muscle tone. Coordination normal.  Skin: Skin is warm and dry. No rash noted.  Onychomycotic toenail changes  Superficial varicosities of the lower extremities  Psychiatric: She has a normal mood and affect. Her behavior is normal.          Assessment & Plan:  Preventive health examination Osteoarthritis  dyslipidemia. Continue atorvastatin  Lower extremity superficial varicosities.  Will refer to [facility] per patient request Toenail onychomycosis.  Will treat with 12 weeks of Lamisil  Chronic cough.  Will review a chest x-ray  We'll review laboratory update Recheck one year

## 2015-12-24 NOTE — Progress Notes (Signed)
Pre visit review using our clinic review tool, if applicable. No additional management support is needed unless otherwise documented below in the visit note. 

## 2015-12-24 NOTE — Patient Instructions (Addendum)
Limit your sodium (Salt) intake    It is important that you exercise regularly, at least 20 minutes 3 to 4 times per week.  If you develop chest pain or shortness of breath seek  medical attention.  You need to lose weight.  Consider a lower calorie diet and regular exercise.  Take a calcium supplement, plus [phone] units of vitamin D  Return in one year for follow-up Menopause is a normal process in which your reproductive ability comes to an end. This process happens gradually over a span of months to years, usually between the ages of 25 and 28. Menopause is complete when you have missed 12 consecutive menstrual periods. It is important to talk with your health care provider about some of the most common conditions that affect postmenopausal women, such as heart disease, cancer, and bone loss (osteoporosis). Adopting a healthy lifestyle and getting preventive care can help to promote your health and wellness. Those actions can also lower your chances of developing some of these common conditions. WHAT SHOULD I KNOW ABOUT MENOPAUSE? During menopause, you may experience a number of symptoms, such as:  Moderate-to-severe hot flashes.  Night sweats.  Decrease in sex drive.  Mood swings.  Headaches.  Tiredness.  Irritability.  Memory problems.  Insomnia. Choosing to treat or not to treat menopausal changes is an individual decision that you make with your health care provider. WHAT SHOULD I KNOW ABOUT HORMONE REPLACEMENT THERAPY AND SUPPLEMENTS? Hormone therapy products are effective for treating symptoms that are associated with menopause, such as hot flashes and night sweats. Hormone replacement carries certain risks, especially as you become older. If you are thinking about using estrogen or estrogen with progestin treatments, discuss the benefits and risks with your health care provider. WHAT SHOULD I KNOW ABOUT HEART DISEASE AND STROKE? Heart disease, heart attack, and stroke  become more likely as you age. This may be due, in part, to the hormonal changes that your body experiences during menopause. These can affect how your body processes dietary fats, triglycerides, and cholesterol. Heart attack and stroke are both medical emergencies. There are many things that you can do to help prevent heart disease and stroke:  Have your blood pressure checked at least every 1-2 years. High blood pressure causes heart disease and increases the risk of stroke.  If you are 87-83 years old, ask your health care provider if you should take aspirin to prevent a heart attack or a stroke.  Do not use any tobacco products, including cigarettes, chewing tobacco, or electronic cigarettes. If you need help quitting, ask your health care provider.  It is important to eat a healthy diet and maintain a healthy weight.  Be sure to include plenty of vegetables, fruits, low-fat dairy products, and lean protein.  Avoid eating foods that are high in solid fats, added sugars, or salt (sodium).  Get regular exercise. This is one of the most important things that you can do for your health.  Try to exercise for at least 150 minutes each week. The type of exercise that you do should increase your heart rate and make you sweat. This is known as moderate-intensity exercise.  Try to do strengthening exercises at least twice each week. Do these in addition to the moderate-intensity exercise.  Know your numbers.Ask your health care provider to check your cholesterol and your blood glucose. Continue to have your blood tested as directed by your health care provider. WHAT SHOULD I KNOW ABOUT CANCER SCREENING? There are  several types of cancer. Take the following steps to reduce your risk and to catch any cancer development as early as possible. Breast Cancer  Practice breast self-awareness.  This means understanding how your breasts normally appear and feel.  It also means doing regular breast  self-exams. Let your health care provider know about any changes, no matter how small.  If you are 41 or older, have a clinician do a breast exam (clinical breast exam or CBE) every year. Depending on your age, family history, and medical history, it may be recommended that you also have a yearly breast X-ray (mammogram).  If you have a family history of breast cancer, talk with your health care provider about genetic screening.  If you are at high risk for breast cancer, talk with your health care provider about having an MRI and a mammogram every year.  Breast cancer (BRCA) gene test is recommended for women who have family members with BRCA-related cancers. Results of the assessment will determine the need for genetic counseling and BRCA1 and for BRCA2 testing. BRCA-related cancers include these types:  Breast. This occurs in males or females.  Ovarian.  Tubal. This may also be called fallopian tube cancer.  Cancer of the abdominal or pelvic lining (peritoneal cancer).  Prostate.  Pancreatic. Cervical, Uterine, and Ovarian Cancer Your health care provider may recommend that you be screened regularly for cancer of the pelvic organs. These include your ovaries, uterus, and vagina. This screening involves a pelvic exam, which includes checking for microscopic changes to the surface of your cervix (Pap test).  For women ages 21-65, health care providers may recommend a pelvic exam and a Pap test every three years. For women ages 44-65, they may recommend the Pap test and pelvic exam, combined with testing for human papilloma virus (HPV), every five years. Some types of HPV increase your risk of cervical cancer. Testing for HPV may also be done on women of any age who have unclear Pap test results.  Other health care providers may not recommend any screening for nonpregnant women who are considered low risk for pelvic cancer and have no symptoms. Ask your health care provider if a screening  pelvic exam is right for you.  If you have had past treatment for cervical cancer or a condition that could lead to cancer, you need Pap tests and screening for cancer for at least 20 years after your treatment. If Pap tests have been discontinued for you, your risk factors (such as having a new sexual partner) need to be reassessed to determine if you should start having screenings again. Some women have medical problems that increase the chance of getting cervical cancer. In these cases, your health care provider may recommend that you have screening and Pap tests more often.  If you have a family history of uterine cancer or ovarian cancer, talk with your health care provider about genetic screening.  If you have vaginal bleeding after reaching menopause, tell your health care provider.  There are currently no reliable tests available to screen for ovarian cancer. Lung Cancer Lung cancer screening is recommended for adults 19-6 years old who are at high risk for lung cancer because of a history of smoking. A yearly low-dose CT scan of the lungs is recommended if you:  Currently smoke.  Have a history of at least 30 pack-years of smoking and you currently smoke or have quit within the past 15 years. A pack-year is smoking an average of one pack  of cigarettes per day for one year. Yearly screening should:  Continue until it has been 15 years since you quit.  Stop if you develop a health problem that would prevent you from having lung cancer treatment. Colorectal Cancer  This type of cancer can be detected and can often be prevented.  Routine colorectal cancer screening usually begins at age 84 and continues through age 10.  If you have risk factors for colon cancer, your health care provider may recommend that you be screened at an earlier age.  If you have a family history of colorectal cancer, talk with your health care provider about genetic screening.  Your health care provider  may also recommend using home test kits to check for hidden blood in your stool.  A small camera at the end of a tube can be used to examine your colon directly (sigmoidoscopy or colonoscopy). This is done to check for the earliest forms of colorectal cancer.  Direct examination of the colon should be repeated every 5-10 years until age 68. However, if early forms of precancerous polyps or small growths are found or if you have a family history or genetic risk for colorectal cancer, you may need to be screened more often. Skin Cancer  Check your skin from head to toe regularly.  Monitor any moles. Be sure to tell your health care provider:  About any new moles or changes in moles, especially if there is a change in a mole's shape or color.  If you have a mole that is larger than the size of a pencil eraser.  If any of your family members has a history of skin cancer, especially at a young age, talk with your health care provider about genetic screening.  Always use sunscreen. Apply sunscreen liberally and repeatedly throughout the day.  Whenever you are outside, protect yourself by wearing long sleeves, pants, a wide-brimmed hat, and sunglasses. WHAT SHOULD I KNOW ABOUT OSTEOPOROSIS? Osteoporosis is a condition in which bone destruction happens more quickly than new bone creation. After menopause, you may be at an increased risk for osteoporosis. To help prevent osteoporosis or the bone fractures that can happen because of osteoporosis, the following is recommended:  If you are 46-47 years old, get at least 1,000 mg of calcium and at least 600 mg of vitamin D per day.  If you are older than age 25 but younger than age 4, get at least 1,200 mg of calcium and at least 600 mg of vitamin D per day.  If you are older than age 70, get at least 1,200 mg of calcium and at least 800 mg of vitamin D per day. Smoking and excessive alcohol intake increase the risk of osteoporosis. Eat foods that are  rich in calcium and vitamin D, and do weight-bearing exercises several times each week as directed by your health care provider. WHAT SHOULD I KNOW ABOUT HOW MENOPAUSE AFFECTS [place]? Depression may occur at any age, but it is more common as you become older. Common symptoms of depression include:  Low or sad mood.  Changes in sleep patterns.  Changes in appetite or eating patterns.  Feeling an overall lack of motivation or enjoyment of activities that you previously enjoyed.  Frequent crying spells. Talk with your health care provider if you think that you are experiencing depression. WHAT SHOULD I KNOW ABOUT IMMUNIZATIONS? It is important that you get and maintain your immunizations. These include:  Tetanus, diphtheria, and pertussis (Tdap) booster vaccine.  Influenza every year before the flu season begins.  Pneumonia vaccine.  Shingles vaccine. Your health care provider may also recommend other immunizations.   This information is not intended to replace advice given to you by your health care provider. Make sure you discuss any questions you have with your health care provider.   Document Released: 01/28/2006 Document Revised: 12/27/2014 Document Reviewed: 08/08/2014 Elsevier Interactive Patient Education [employer].

## 2015-12-25 ENCOUNTER — Other Ambulatory Visit: Payer: Self-pay

## 2015-12-25 DIAGNOSIS — Z1231 Encounter for screening mammogram for malignant neoplasm of breast: Secondary | ICD-10-CM

## 2015-12-26 ENCOUNTER — Ambulatory Visit
Admission: RE | Admit: 2015-12-26 | Discharge: 2015-12-26 | Disposition: A | Discharge status: Home / Self Care | Payer: Medicare Other | Source: Ambulatory Visit

## 2015-12-26 ENCOUNTER — Ambulatory Visit (INDEPENDENT_AMBULATORY_CARE_PROVIDER_SITE_OTHER)
Admission: RE | Admit: 2015-12-26 | Discharge: 2015-12-26 | Disposition: A | Discharge status: Home / Self Care | Payer: Medicare Other | Source: Ambulatory Visit | Attending: Internal Medicine | Admitting: Internal Medicine

## 2015-12-26 DIAGNOSIS — Z1231 Encounter for screening mammogram for malignant neoplasm of breast: Secondary | ICD-10-CM

## 2015-12-26 DIAGNOSIS — R05 Cough: Secondary | ICD-10-CM | POA: Diagnosis not present

## 2015-12-26 DIAGNOSIS — R053 Chronic cough: Secondary | ICD-10-CM

## 2015-12-30 ENCOUNTER — Encounter: Payer: Self-pay | Admitting: Internal Medicine

## 2015-12-30 ENCOUNTER — Ambulatory Visit (INDEPENDENT_AMBULATORY_CARE_PROVIDER_SITE_OTHER): Payer: Medicare Other | Admitting: Internal Medicine

## 2015-12-30 VITALS — BP 138/82 | HR 100 | Ht 67.0 in | Wt 198.1 lb

## 2015-12-30 DIAGNOSIS — I739 Peripheral vascular disease, unspecified: Secondary | ICD-10-CM

## 2015-12-30 DIAGNOSIS — R0609 Other forms of dyspnea: Secondary | ICD-10-CM

## 2015-12-30 DIAGNOSIS — E785 Hyperlipidemia, unspecified: Secondary | ICD-10-CM

## 2015-12-30 DIAGNOSIS — R0600 Dyspnea, unspecified: Secondary | ICD-10-CM | POA: Insufficient documentation

## 2015-12-30 DIAGNOSIS — R0602 Shortness of breath: Secondary | ICD-10-CM | POA: Diagnosis not present

## 2015-12-30 DIAGNOSIS — I779 Disorder of arteries and arterioles, unspecified: Secondary | ICD-10-CM

## 2015-12-30 DIAGNOSIS — J449 Chronic obstructive pulmonary disease, unspecified: Secondary | ICD-10-CM | POA: Insufficient documentation

## 2015-12-30 DIAGNOSIS — J438 Other emphysema: Secondary | ICD-10-CM

## 2015-12-30 DIAGNOSIS — I6529 Occlusion and stenosis of unspecified carotid artery: Secondary | ICD-10-CM | POA: Diagnosis not present

## 2015-12-30 NOTE — Progress Notes (Signed)
OFFICE NOTE  Chief Complaint:  Worsening shortness of breath  Primary Care Physician: Nyoka Cowden, MD  HPI:  Mary Summers  is a 82 year old female who has done generally very well. Her husband also is a patient of mine. Actually, last year she had undergone a [employer] mobile screening test which showed mild carotid artery disease and low Doppler velocities bilaterally in the carotids. She also has some increased risk for osteoporosis and dyslipidemia with an elevated total cholesterol of 212, LDL of 136 at the time. She was started on low dose pravastatin and a comprehensive lipid profile was obtained including NMR. Her NMR demonstrated actually high particle number relative to her LDL cholesterol 1736 with a calculated LDL of 148, HDL of 64 and high small LDL particle number 536. Overall, an atherogenic lipid profile. I recommended then changing to a moderate to high intensity statin, specifically atorvastatin 40 mg daily. She did make that switch, seems to be tolerating the atorvastatin without any significant myalgias or any other problems with medications. In December, she underwent left hip replacement by Dr. Wynelle Link and is doing very well with increased activity and mobility due to that. Overall not complaining of any cardiac symptoms including shortness of breath, palpitations, chest pain, presyncope or other syncopal symptoms.   No new events are noted since I last saw her. She continues to take her cholesterol medicine is due for recheck to her primary care provider in a couple of weeks. She does report some nocturnal productive cough and was told before that she may have slight overinflation of her lungs. I wonder she has some element of chronic bronchitis or asthmatic bronchitis.  [name] returns today for follow-up with her husband. She reports that she's had some progressive shortness of breath which has been worsening over the past year. Particularly her symptoms  are at night and she notes increased mucus production. As we discussed previously she does have a history of COPD. This was recently presented to her primary care provider who got an x-ray of her last week. The chest x-ray demonstrates progressive interstitial lung disease as well as COPD. She also was noted to have onychomycosis of her toenails. It was recommended that she go on to Lamisil, but that she discontinue her Lipitor while on treatment with that medication.   PMHx:  Past Medical History  Diagnosis Date  . Seasonal allergies   . COPD (chronic obstructive pulmonary disease) (HCC)     PT HAS BEEN TOLD COPD--BUT SHE DOES NOT NOTICE SOB--HX OF EXPOSURE TO CHEMICALS SUCH AS LACQUERS FOR FURNITURE REFINISHING--NEVER SMOKED  . Arthritis   . Peripheral vascular disease ([place])     MILD CAROTID DISEASE BY DOPPLER STUDY  11/13/12. -REPORT FROM [facility]  . Hyperlipidemia     Past Surgical History  Procedure Laterality Date  . Congenitally absent uterus    . Breast surgery      BREAST BIOPSIES X 2  - NO CANCER  . Laser vocal cord for nodule  1985    . Total hip arthroplasty  11/29/2012    Procedure: TOTAL HIP ARTHROPLASTY ANTERIOR APPROACH;  Surgeon: Gearlean Alf, MD;  Location: [facility];  Service: Orthopedics;  Laterality: Left;  . Doppler echocardiography      FAMHx:  No family history on file.  SOCHx:   reports that she has never smoked. She has never used smokeless tobacco. She reports that she drinks alcohol. She reports that she does  not use illicit drugs.  ALLERGIES:  No Known Allergies  ROS: A comprehensive review of systems was negative except for: Respiratory: positive for cough and dyspnea on exertion  HOME MEDS: Current Outpatient Prescriptions  Medication Sig Dispense Refill  . Ascorbic Acid (VITAMIN C PO) Take 1 tablet by mouth daily.    Marland Kitchen atorvastatin (LIPITOR) 40 MG tablet Take 1 tablet (40 mg total) by mouth daily. 90 tablet  3  . beta carotene 30 MG capsule Take 30 mg by mouth daily.    . ciclopirox (PENLAC) 8 % solution Apply topically at bedtime. Apply over nail and surrounding skin. Apply daily over previous coat. After seven (7) days, may remove with alcohol and continue cycle. 6.6 mL 0  . fluticasone (FLONASE) 50 MCG/ACT nasal spray Place 1 spray into both nostrils daily as needed for allergies or rhinitis. ONCE A DAY IF NEEDED 16 g 2  . glucosamine-chondroitin 500-400 MG tablet Take 2 tablets by mouth daily.    . Multiple Vitamin (MULTIVITAMIN) tablet Take 1 tablet by mouth daily.     . Multiple Vitamins-Minerals (ZINC PO) Take 1 tablet by mouth daily.    . Omega-3 Fatty Acids (OMEGA 3 PO) Take 1 capsule by mouth daily.    Marland Kitchen terbinafine (LAMISIL) 250 MG tablet Take 1 tablet (250 mg total) by mouth daily. 90 tablet 0  . Vitamin D, Cholecalciferol, 1000 units CAPS Take 1 tablet by mouth daily.    . vitamin E 400 UNIT capsule Take 400 Units by mouth daily.     No current facility-administered medications for this visit.    LABS/IMAGING: No results found for this or any previous visit (from the past 48 hour(s)). No results found.  VITALS: BP 138/82 mmHg  Pulse 100  Ht 5\' 7"  (1.702 m)  Wt 198 lb 1.6 oz (89.858 kg)  BMI 31.02 kg/m2  EXAM: General appearance: alert and no distress Neck: no carotid bruit, no JVD and thyroid not enlarged, symmetric, no tenderness/mass/nodules Lungs: diminished breath sounds bilaterally Heart: regular rate and rhythm Abdomen: soft, non-tender; bowel sounds normal; no masses,  no organomegaly Extremities: extremities normal, atraumatic, no cyanosis or edema Pulses: 2+ and symmetric Skin: Skin color, texture, turgor normal. No rashes or lesions Neurologic: Grossly normal  EKG: Normal sinus rhythm at 100  ASSESSMENT: 1. Progressive dyspnea on exertion-chest x-ray findings of COPD/interstitial lung disease 2. Dyslipidemia 3. Mild bilateral carotid artery  disease 4. Productive nocturnal cough - likely related to COPD 5. Onychomycosis  PLAN: 1.   [name] has had some progressive worsening of her shortness of breath. Her chest x-ray shows COPD again with some worsening interstitial lung disease. She is not on any inhalers or treatment for COPD at this time. She does have increased mucus production, particularly at night. I think she would benefit from pulmonary function testing with diffusion to assess the extent of her interstitial lung disease. I will defer to her primary care provider but she would likely benefit from a high-resolution CT of the chest as well. She is overdue for repeat carotid Dopplers which I will order today. With regard her onchomycosis, if her Dopplers are stable, then we could consider a short statin holiday while she takes Lamisil to try to treat her toenail fungus. I agree that it could be very difficult on her liver enzymes to be on both statin as well as the Lamisil.  Follow-up annually or sooner as necessary. I will contact her with the results of her pulmonary function  testing but defer further workup to her primary care provider.  Pixie Casino, MD, [facility] Attending Cardiologist [place] H[place] 12/30/2015, 5:49 PM

## 2015-12-30 NOTE — Patient Instructions (Signed)
Medication Instructions:  Your physician recommends that you continue on your current medications as directed. Please refer to the Current Medication list given to you today.   Labwork: none  Testing/Procedures: Your physician has recommended that you have a pulmonary function test. Pulmonary Function Tests are a group of tests that measure how well air moves in and out of your lungs.  Your physician has requested that you have a carotid duplex. This test is an ultrasound of the carotid arteries in your neck. It looks at blood flow through these arteries that supply the brain with blood. Allow one hour for this exam. There are no restrictions or special instructions.   Follow-Up: Your physician wants you to follow-up in: 12 months with Dr. Debara Pickett. You will receive a reminder letter in the mail two months in advance. If you don't receive a letter, please call our office to schedule the follow-up appointment.   Any Other Special Instructions Will Be Listed Below (If Applicable).     If you need a refill on your cardiac medications before your next appointment, please call your pharmacy.

## 2015-12-31 ENCOUNTER — Telehealth: Payer: Self-pay | Admitting: Internal Medicine

## 2015-12-31 ENCOUNTER — Other Ambulatory Visit: Payer: Self-pay | Admitting: Internal Medicine

## 2015-12-31 ENCOUNTER — Ambulatory Visit (HOSPITAL_COMMUNITY)
Admission: RE | Admit: 2015-12-31 | Discharge: 2015-12-31 | Disposition: A | Discharge status: Home / Self Care | Payer: Medicare Other | Source: Ambulatory Visit | Attending: Internal Medicine | Admitting: Internal Medicine

## 2015-12-31 DIAGNOSIS — R0602 Shortness of breath: Secondary | ICD-10-CM | POA: Diagnosis not present

## 2015-12-31 DIAGNOSIS — I779 Disorder of arteries and arterioles, unspecified: Secondary | ICD-10-CM

## 2015-12-31 DIAGNOSIS — I739 Peripheral vascular disease, unspecified: Secondary | ICD-10-CM

## 2015-12-31 DIAGNOSIS — J8410 Pulmonary fibrosis, unspecified: Secondary | ICD-10-CM

## 2015-12-31 DIAGNOSIS — I6523 Occlusion and stenosis of bilateral carotid arteries: Secondary | ICD-10-CM | POA: Diagnosis not present

## 2015-12-31 DIAGNOSIS — R928 Other abnormal and inconclusive findings on diagnostic imaging of breast: Secondary | ICD-10-CM

## 2015-12-31 NOTE — Telephone Encounter (Signed)
Pt said breast center need a new referral to do another Mammogram on left breast. Pt has an appt at [place] on 01/01/16

## 2015-12-31 NOTE — Telephone Encounter (Signed)
Order is already in and Dr. Raliegh Ip signed them.

## 2015-12-31 NOTE — Telephone Encounter (Signed)
This is an order that the dr has to add on I can not out in orders

## 2015-12-31 NOTE — Telephone Encounter (Signed)
Mary Summers, looks like a referral was done. I am not sure if pt needs another referral. Please see message.

## 2016-01-01 ENCOUNTER — Ambulatory Visit
Admission: RE | Admit: 2016-01-01 | Discharge: 2016-01-01 | Disposition: A | Discharge status: Home / Self Care | Payer: Medicare Other | Source: Ambulatory Visit | Attending: Internal Medicine | Admitting: Internal Medicine

## 2016-01-01 DIAGNOSIS — R928 Other abnormal and inconclusive findings on diagnostic imaging of breast: Secondary | ICD-10-CM

## 2016-01-05 ENCOUNTER — Ambulatory Visit (HOSPITAL_COMMUNITY)
Admission: RE | Admit: 2016-01-05 | Discharge: 2016-01-05 | Disposition: A | Discharge status: Home / Self Care | Payer: Medicare Other | Source: Ambulatory Visit | Attending: Internal Medicine | Admitting: Internal Medicine

## 2016-01-05 DIAGNOSIS — R0602 Shortness of breath: Secondary | ICD-10-CM | POA: Insufficient documentation

## 2016-01-05 DIAGNOSIS — I739 Peripheral vascular disease, unspecified: Secondary | ICD-10-CM

## 2016-01-05 DIAGNOSIS — I779 Disorder of arteries and arterioles, unspecified: Secondary | ICD-10-CM | POA: Diagnosis not present

## 2016-01-05 LAB — PULMONARY FUNCTION TEST
DL/VA % pred: 76 %
DL/VA: 3.92 ml/min/mmHg/L
DLCO UNC % PRED: 65 %
DLCO UNC: 18.46 ml/min/mmHg
FEF 25-75 PRE: 2.95 L/s
FEF 25-75 Post: 1.97 L/sec
FEF2575-%CHANGE-POST: -33 %
FEF2575-%PRED-PRE: 196 %
FEF2575-%Pred-Post: 131 %
FEV1-%CHANGE-POST: -8 %
FEV1-%PRED-POST: 109 %
FEV1-%Pred-Pre: 119 %
FEV1-POST: 2.38 L
FEV1-Pre: 2.6 L
FEV1FVC-%Change-Post: -7 %
FEV1FVC-%PRED-PRE: 113 %
FEV6-%CHANGE-POST: -2 %
FEV6-%PRED-POST: 111 %
FEV6-%Pred-Pre: 113 %
FEV6-POST: 3.07 L
FEV6-PRE: 3.14 L
FEV6FVC-%PRED-POST: 105 %
FEV6FVC-%PRED-PRE: 105 %
FVC-%Change-Post: -1 %
FVC-%Pred-Post: 106 %
FVC-%Pred-Pre: 107 %
FVC-Post: 3.1 L
FVC-Pre: 3.14 L
POST FEV1/FVC RATIO: 77 %
PRE FEV6/FVC RATIO: 100 %
Post FEV6/FVC ratio: 100 %
Pre FEV1/FVC ratio: 83 %
RV % PRED: 102 %
RV: 2.64 L
TLC % PRED: 106 %
TLC: 5.87 L

## 2016-01-05 MED ORDER — ALBUTEROL SULFATE (2.5 MG/3ML) 0.083% IN NEBU
2.5000 mg | INHALATION_SOLUTION | Freq: Once | RESPIRATORY_TRACT | Status: AC
  Administered 2016-01-05: 2.5 mg via RESPIRATORY_TRACT

## 2016-01-07 ENCOUNTER — Telehealth: Payer: Self-pay | Admitting: *Deleted

## 2016-01-07 NOTE — Telephone Encounter (Addendum)
-----   Message from Pixie Casino, MD sent at 01/06/2016  9:12 PM EST ----- PFT's show mild diffusion defect. CXR shows COPD and progressive interstitial lung disease. Please refer to Dr. Lake Bells with pulmonary for this - symptoms of SOB.  Thanks.  Dr. Lemmie Evens  Left message for pt to call

## 2016-01-08 NOTE — Telephone Encounter (Signed)
Calling about her test results , please call   Thanks

## 2016-01-08 NOTE — Telephone Encounter (Signed)
No answer when dialed - left message to call.

## 2016-01-08 NOTE — Telephone Encounter (Signed)
Please call back on her cell number .Marland Kitchen Thanks

## 2016-01-08 NOTE — Telephone Encounter (Signed)
Discussed results of tests, pt voiced understanding. Consideration for referral already discussed w/ Dr. Raliegh Ip, she will see Dr. Ashok Cordia w/ pulmonary and was happy with this arrangement.  Pt to f/u w pulmonary for SOB symptoms. Recommended pt to call us for any other needs we may be able to help with.

## 2016-01-13 ENCOUNTER — Telehealth: Payer: Self-pay | Admitting: Internal Medicine

## 2016-01-13 ENCOUNTER — Ambulatory Visit: Payer: Medicare Other | Admitting: Internal Medicine

## 2016-01-13 DIAGNOSIS — M79642 Pain in left hand: Secondary | ICD-10-CM

## 2016-01-13 DIAGNOSIS — M7989 Other specified soft tissue disorders: Secondary | ICD-10-CM

## 2016-01-13 NOTE — Telephone Encounter (Signed)
Ok for referral?

## 2016-01-13 NOTE — Telephone Encounter (Signed)
Okay to send referral. 

## 2016-01-13 NOTE — Telephone Encounter (Signed)
Pt want a referral to a hand Specialist ,pain and swelling in the left hand want it soon as you can get her one. Would like a call back.

## 2016-01-14 NOTE — Telephone Encounter (Signed)
Spoke to pt, told her order for referral to Ortho Hand Specialist was done and someone will be contacting you regarding an appt. Pt verbalized understanding.

## 2016-02-16 ENCOUNTER — Institutional Professional Consult (permissible substitution): Payer: Medicare Other | Admitting: Pulmonary Disease

## 2016-02-19 ENCOUNTER — Other Ambulatory Visit: Payer: Self-pay | Admitting: *Deleted

## 2016-02-24 ENCOUNTER — Telehealth: Payer: Self-pay | Admitting: Pulmonary Disease

## 2016-02-24 NOTE — Telephone Encounter (Signed)
PFT 01/05/16: FVC 3.14 L (107%) FEV1 2.60 L (119%) FEV1/FVC 0.83 FEF 25-75 2.95 L (196%) no bronchodilator response TLC 5.87 L (106%) RV 102% ERV 81% DLCO uncorrected 65%  IMAGING CXR PA/LAT 12/26/15 (personally reviewed by me): Progressively worsening bilateral interstitial prominence. Irregular opacity  Right upper lobe seems to be slightly enlarging. No pleural effusion or thickening appreciated. Heart normal in size. Mediastinum normal in contour.  LABS 12/24/15 CBC: 6.1/15.1/45.8/203 BMP: 142/4.2/104/28/14/0.63/92/9.6 LFT: 4.2/6.9/0.6/88/20/19

## 2016-02-25 ENCOUNTER — Encounter: Payer: Self-pay | Admitting: Pulmonary Disease

## 2016-02-25 ENCOUNTER — Other Ambulatory Visit (INDEPENDENT_AMBULATORY_CARE_PROVIDER_SITE_OTHER): Payer: Medicare Other

## 2016-02-25 ENCOUNTER — Ambulatory Visit (INDEPENDENT_AMBULATORY_CARE_PROVIDER_SITE_OTHER): Payer: Medicare Other | Admitting: Pulmonary Disease

## 2016-02-25 VITALS — BP 124/72 | HR 119 | Ht 67.0 in | Wt 201.4 lb

## 2016-02-25 DIAGNOSIS — J849 Interstitial pulmonary disease, unspecified: Secondary | ICD-10-CM

## 2016-02-25 DIAGNOSIS — M1990 Unspecified osteoarthritis, unspecified site: Secondary | ICD-10-CM | POA: Insufficient documentation

## 2016-02-25 LAB — SEDIMENTATION RATE: SED RATE: 9 mm/h (ref 0–22)

## 2016-02-25 LAB — C-REACTIVE PROTEIN: CRP: 0.2 mg/dL — AB (ref 0.5–20.0)

## 2016-02-25 NOTE — Progress Notes (Signed)
Subjective:    Patient ID: Mary Summers, female    DOB: 09/18/1932, 82 y.o.   MRN: [id number]  HPI She reports that when she was a child in [place] she had breathing problems and was treated with some inhalational therapy. She reports she has always had the ability to sing. She was found to have an abnormality in her lungs in 1974 during a workup for a breast abnormality that resulted in a biopsy of her breast. She reports over the last 10-12 months she has noticed increased dyspnea with getting up at night to go to the bathroom. She admits she doesn't exercise "as she should". She reports she has had increased cough productive of a clear mucus. She reports she does have significant dyspnea climbing 1 flight of stairs in her home as well as walking up her driveway to her mailbox. She reports she has joint pain predominantly in her hands but also her lower back. She reports some mild morning stiffness. Denies any joint swelling or erythema. No Raynaud's. No dysphagia or odynophagia. No reflux or dyspepsia. No morning brash water taste. She does have dry eyes. No dry mouth or oral ulcers. No rashes or bruising. Denies any prior exposure to radiation therapy, chemotherapy, or amiodarone in the past.  Review of Systems  No dysuria or hematuria. No chest pain, tightness, or pressure. A pertinent 14 point review of systems is negative except as per the history of presenting illness.  No Known Allergies  Current Outpatient Prescriptions on File Prior to Visit  Medication Sig Dispense Refill  . Ascorbic Acid (VITAMIN C PO) Take 1 tablet by mouth daily.    Marland Kitchen aspirin 81 MG tablet Take 81 mg by mouth daily.    Marland Kitchen atorvastatin (LIPITOR) 40 MG tablet Take 1 tablet (40 mg total) by mouth daily. 90 tablet 3  . beta carotene 30 MG capsule Take 30 mg by mouth daily.    Marland Kitchen BIOTIN PO Take 1 tablet by mouth daily.    . calcium gluconate 500 MG tablet Take 2 tablets by mouth daily.    . ciclopirox (PENLAC) 8 %  solution Apply topically at bedtime. Apply over nail and surrounding skin. Apply daily over previous coat. After seven (7) days, may remove with alcohol and continue cycle. 6.6 mL 0  . co-enzyme Q-10 30 MG capsule Take 30 mg by mouth daily.    . fluticasone (FLONASE) 50 MCG/ACT nasal spray Place 1 spray into both nostrils daily as needed for allergies or rhinitis. ONCE A DAY IF NEEDED 16 g 2  . glucosamine-chondroitin 500-400 MG tablet Take 2 tablets by mouth daily.    . Multiple Vitamin (MULTIVITAMIN) tablet Take 1 tablet by mouth daily.     . Multiple Vitamins-Minerals (ZINC PO) Take 1 tablet by mouth daily.    . Omega-3 Fatty Acids (OMEGA 3 PO) Take 1 capsule by mouth daily.    . SELENIUM PO Take 1 tablet by mouth daily.    Marland Kitchen terbinafine (LAMISIL) 250 MG tablet Take 1 tablet (250 mg total) by mouth daily. 90 tablet 0  . Vitamin D, Cholecalciferol, 1000 units CAPS Take 1 tablet by mouth daily.    . vitamin E 400 UNIT capsule Take 400 Units by mouth daily.     No current facility-administered medications on file prior to visit.    Past Medical History  Diagnosis Date  . Seasonal allergies   . COPD (chronic obstructive pulmonary disease) (HCC)     PT HAS  BEEN TOLD COPD--BUT SHE DOES NOT NOTICE SOB--HX OF EXPOSURE TO CHEMICALS SUCH AS LACQUERS FOR FURNITURE REFINISHING--NEVER SMOKED  . Arthritis   . Peripheral vascular disease ([place])     MILD CAROTID DISEASE BY DOPPLER STUDY  11/13/12. -REPORT FROM [facility]  . Hyperlipidemia     Past Surgical History  Procedure Laterality Date  . Congenitally absent uterus    . Breast surgery      BREAST BIOPSIES X 2  - NO CANCER  . Laser vocal cord for nodule  1985    . Total hip arthroplasty  11/29/2012    Procedure: TOTAL HIP ARTHROPLASTY ANTERIOR APPROACH;  Surgeon: Gearlean Alf, MD;  Location: [facility];  Service: Orthopedics;  Laterality: Left;  . Doppler echocardiography      No family history on  file.  Social History   Social History  . Marital Status: Married    Spouse Name: N/A  . Number of Children: 0  . Years of Education: N/A   Occupational History  . retired    Social History Main Topics  . Smoking status: Never Smoker   . Smokeless tobacco: Never Used  . Alcohol Use: 0.0 oz/week    0 Standard drinks or equivalent per week     Comment: OCCAS ALCOHOL  . Drug Use: No  . Sexual Activity: Not Asked   Other Topics Concern  . None   Social History Narrative      Objective:   Physical Exam BP 124/72 mmHg  Pulse 119  Ht 5' 7"  (1.702 m)  Wt 201 lb 6.4 oz (91.354 kg)  BMI 31.54 kg/m2  SpO2 94% General:  Awake. Alert. No acute distress.  Integument:  Warm & dry. No rash on exposed skin. No bruising. Lymphatics:  No appreciated cervical or supraclavicular lymphadenoapthy. HEENT:  Moist mucus membranes. No oral ulcers. No scleral injection or icterus.  Cardiovascular:  Regular rate. No edema. No appreciable JVD.  Pulmonary:  Faint bilateral basilar crackles. Symmetric chest wall expansion. No accessory muscle use on room air. Abdomen: Soft. Normal bowel sounds. Nondistended. Grossly nontender. Musculoskeletal:  Normal bulk and tone. Hand grip strength 5/5 bilaterally. No joint effusion appreciated. Patient does have synovial thickening of bilateral PIP & DIP joints as well as some deformity of the DIP joints bilaterally. Neurological:  CN 2-12 grossly in tact. No meningismus. Moving all 4 extremities equally. Symmetric brachioradialis deep tendon reflexes. Psychiatric:  Mood and affect congruent. Speech normal rhythm, rate & tone.   PFT 01/05/16: FVC 3.14 L (107%) FEV1 2.60 L (119%) FEV1/FVC 0.83 FEF 25-75 2.95 L (196%) no bronchodilator response TLC 5.87 L (106%) RV 102% ERV 81% DLCO uncorrected 65%  IMAGING CXR PA/LAT 12/26/15 (personally reviewed by me): Progressively worsening bilateral interstitial prominence. Irregular opacity Right upper lobe seems to be  slightly enlarging. No pleural effusion or thickening appreciated. Heart normal in size. Mediastinum normal in contour.  LABS 12/24/15 CBC: 6.1/15.1/45.8/203 BMP: 142/4.2/104/28/14/0.63/92/9.6 LFT: 4.2/6.9/0.6/88/20/19    Assessment & Plan:  82 year old female with prior exposure to hot tubs as well as possible exotic words. Chest x-ray imaging which I personally reviewed does show interstitial prominence that would be consistent with interstitial lung disease. With her long history of arthritis and the chronicity going back to the 1970s per her report I believe an autoimmune etiologies such as rheumatoid is certainly possible, especially with her sister and mother having arthritis as well. She has no symptoms that would suggest GERD at this time  but I did caution her to continue to monitor for any signs. I instructed the patient to contact my office if she had any new breathing problems before her next appointment and we will hold off on initiating any immunosuppression or treatment at this time pending further results.  1. Interstitial lung disease: Checking high-resolution CT chest without contrast. Checking strongly with DLCO & 6 minute walk test at next appointment. Holding on initiating immunosuppression at this time. 2. Arthritis: Checking serum ANA with reflex to comprehensive panel, ESR, CRP, rheumatoid factor, anti-CCP, SSA, & SSB. 3. Follow-up: Patient to return to clinic in 4-6 weeks.  Sonia Baller Ashok Cordia, M.D. [facility] Pulmonary [facility] Critical [facility] Pager:  [phone] After 3pm or if no response, call [phone] 10:55 AM 02/25/2016

## 2016-02-25 NOTE — Patient Instructions (Signed)
   Please call me if he had any new breathing problems before your next appointment.  Please be mindful of any potential reflux that you may be having  We will be doing a breathing walking test at your next appointment  Please call my office or contact me if you have any further questions or concerns  I will see you back in 4-6 weeks to review the results of your imaging and testing  TESTS ORDERED: 1. High-resolution CT chest without contrast 2. Spirometry with DLCO at next appointment 3. 6 minute walk test on room air at next appointment 4. Serum lab tests today

## 2016-02-26 LAB — ANTINUCLEAR ANTIBODIES, IFA: ANA TITER 1: NEGATIVE

## 2016-02-26 LAB — RHEUMATOID FACTOR: RHEUMATOID FACTOR: 72 [IU]/mL — AB (ref <=14)

## 2016-02-26 LAB — SJOGRENS SYNDROME-B EXTRACTABLE NUCLEAR ANTIBODY: SSB (LA) (ENA) ANTIBODY, IGG: NEGATIVE

## 2016-02-26 LAB — CYCLIC CITRUL PEPTIDE ANTIBODY, IGG: Cyclic Citrullin Peptide Ab: <16 Units

## 2016-02-26 LAB — SJOGRENS SYNDROME-A EXTRACTABLE NUCLEAR ANTIBODY: SSA (RO) (ENA) ANTIBODY, IGG: POSITIVE — AB

## 2016-02-29 ENCOUNTER — Other Ambulatory Visit: Payer: Self-pay | Admitting: Internal Medicine

## 2016-03-02 ENCOUNTER — Ambulatory Visit (INDEPENDENT_AMBULATORY_CARE_PROVIDER_SITE_OTHER)
Admission: RE | Admit: 2016-03-02 | Discharge: 2016-03-02 | Disposition: A | Discharge status: Home / Self Care | Payer: Medicare Other | Source: Ambulatory Visit | Attending: Pulmonary Disease | Admitting: Pulmonary Disease

## 2016-03-02 DIAGNOSIS — J849 Interstitial pulmonary disease, unspecified: Secondary | ICD-10-CM

## 2016-03-03 ENCOUNTER — Telehealth: Payer: Self-pay | Admitting: Pulmonary Disease

## 2016-03-03 NOTE — Telephone Encounter (Signed)
Spoke with the patient regarding the results of her high-resolution CT scan and serum blood work. Mild elevation in SSA suggestive of Sjogren syndrome. Remainder of lab work is unimpressive other than elevated rheumatoid factor which is difficult to interpret in the setting of a negative anti-CCP and negative ANA. The patient's high-resolution CT scan is predominantly in NSIP pattern but again appears mild. Plan for spirometry with DLCO on a 6 minute walk test at next appointment to continue to further monitor and trend lung function. Patient will notify me for any new breathing problems before then.

## 2016-03-05 ENCOUNTER — Other Ambulatory Visit: Payer: Self-pay | Admitting: *Deleted

## 2016-03-05 DIAGNOSIS — I8390 Asymptomatic varicose veins of unspecified lower extremity: Secondary | ICD-10-CM

## 2016-03-10 IMAGING — MG MM SCREENING BREAST TOMO BILATERAL
6 of 9 series · 6 of 25 positions shown · non-contrast
Comparison: Previous exam(s).

CLINICAL DATA: Screening.

EXAM:
DIGITAL SCREENING BILATERAL MAMMOGRAM WITH 3D TOMO WITH CAD

[L MLO (1 of 2)]
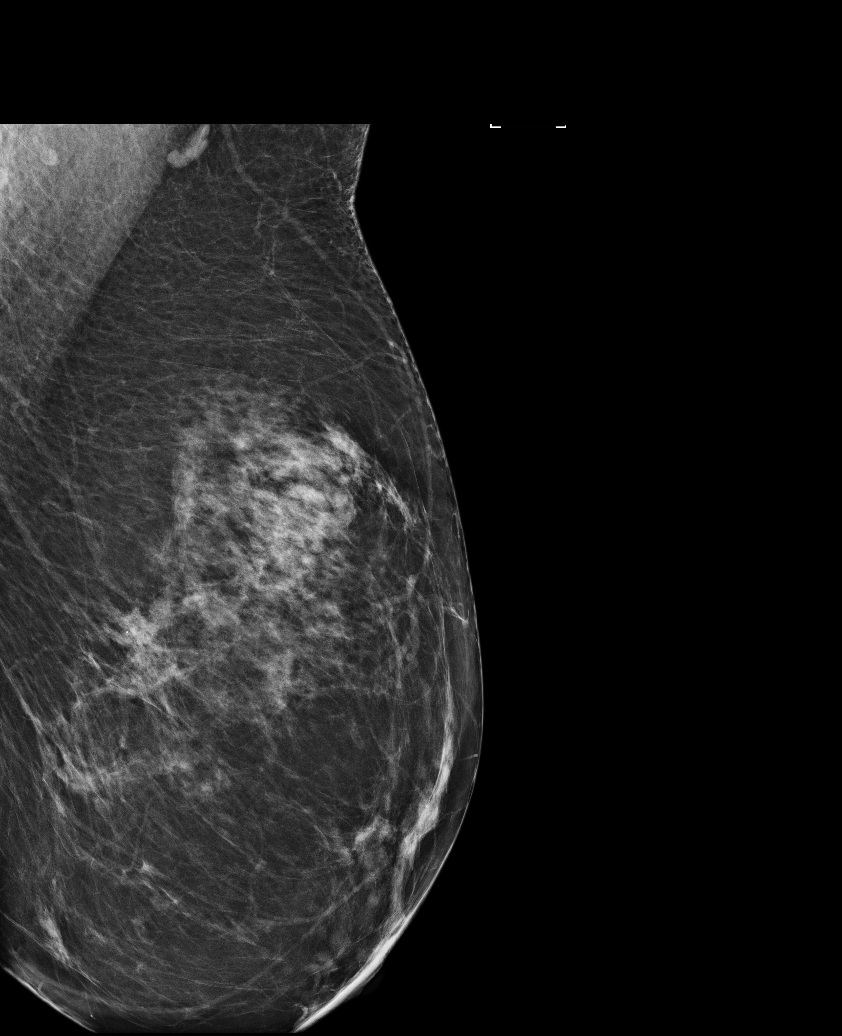

[R CC]
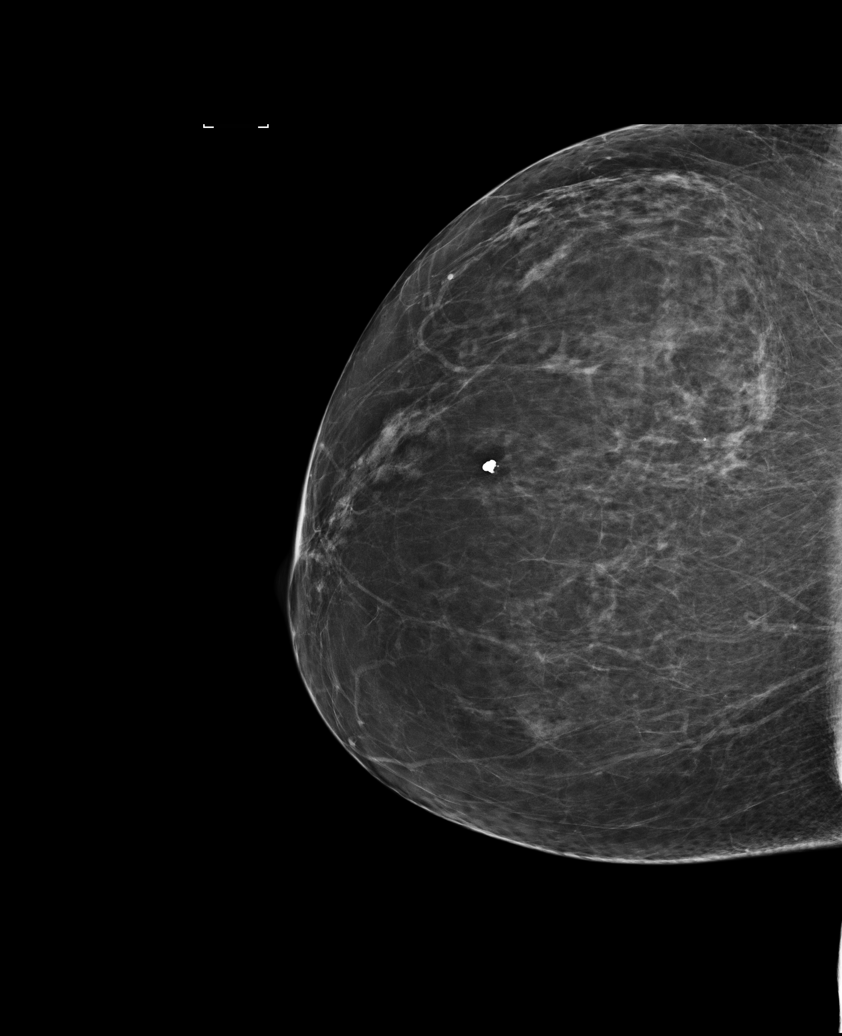

[L CC]
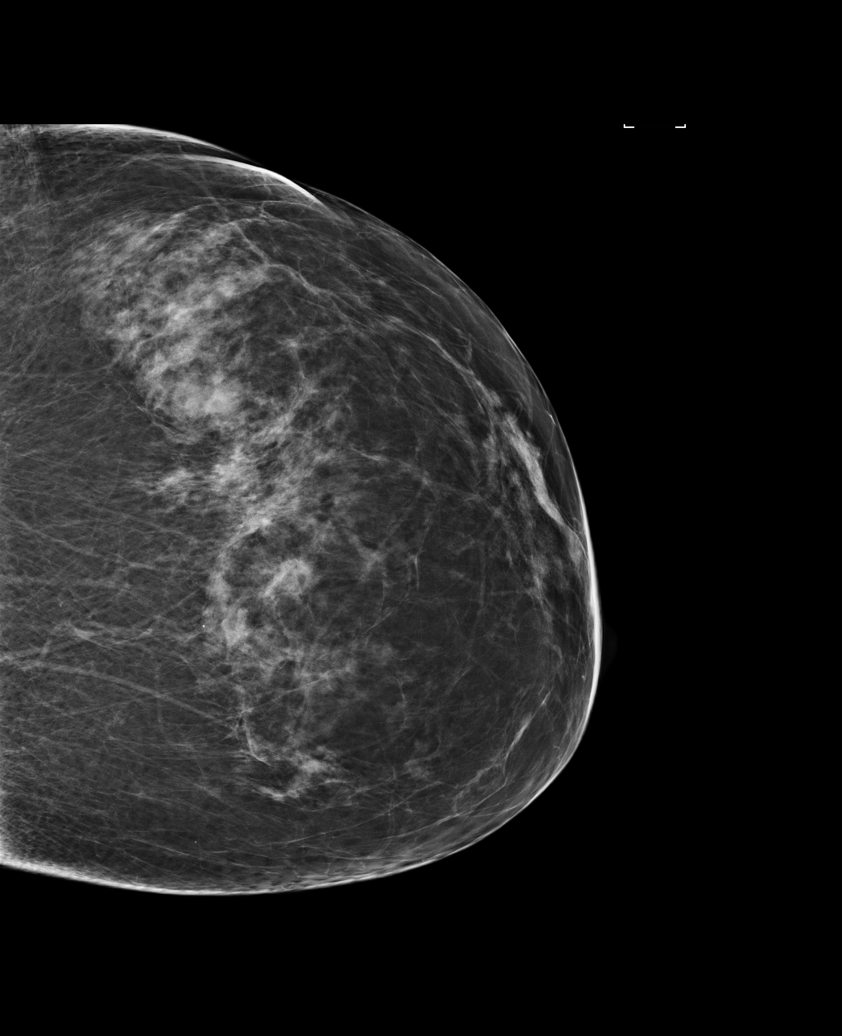

[R MLO]
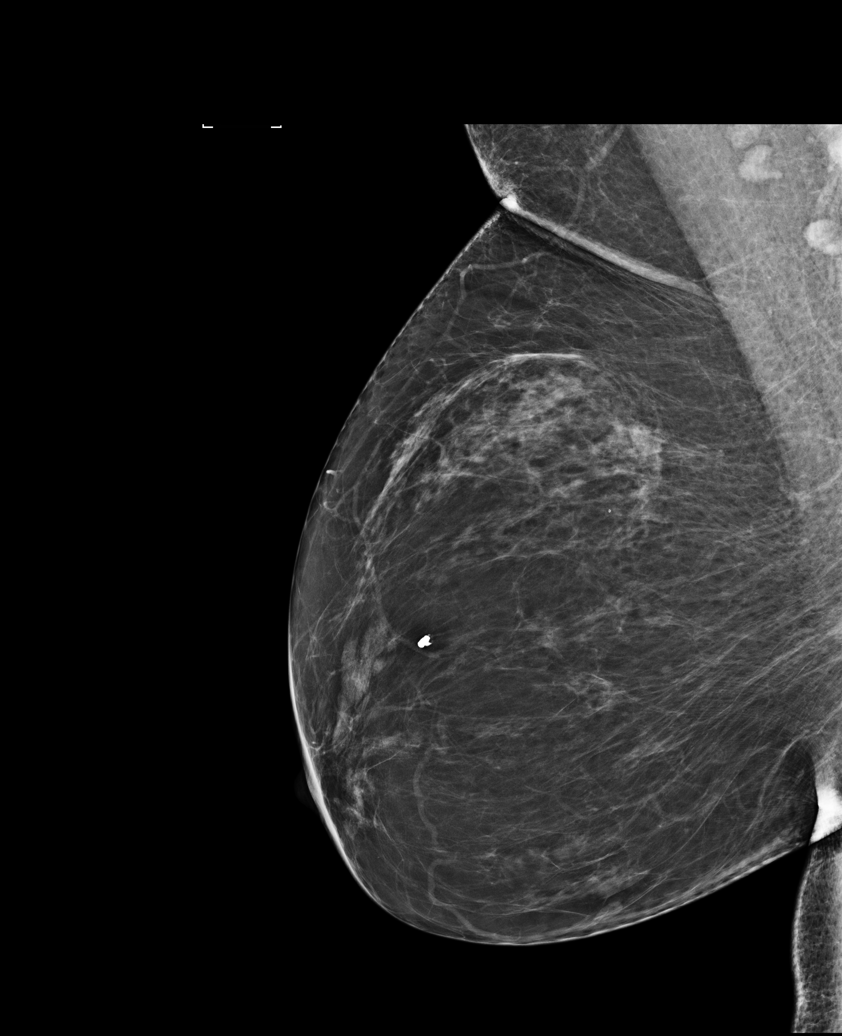

[L MLO (2 of 2)]
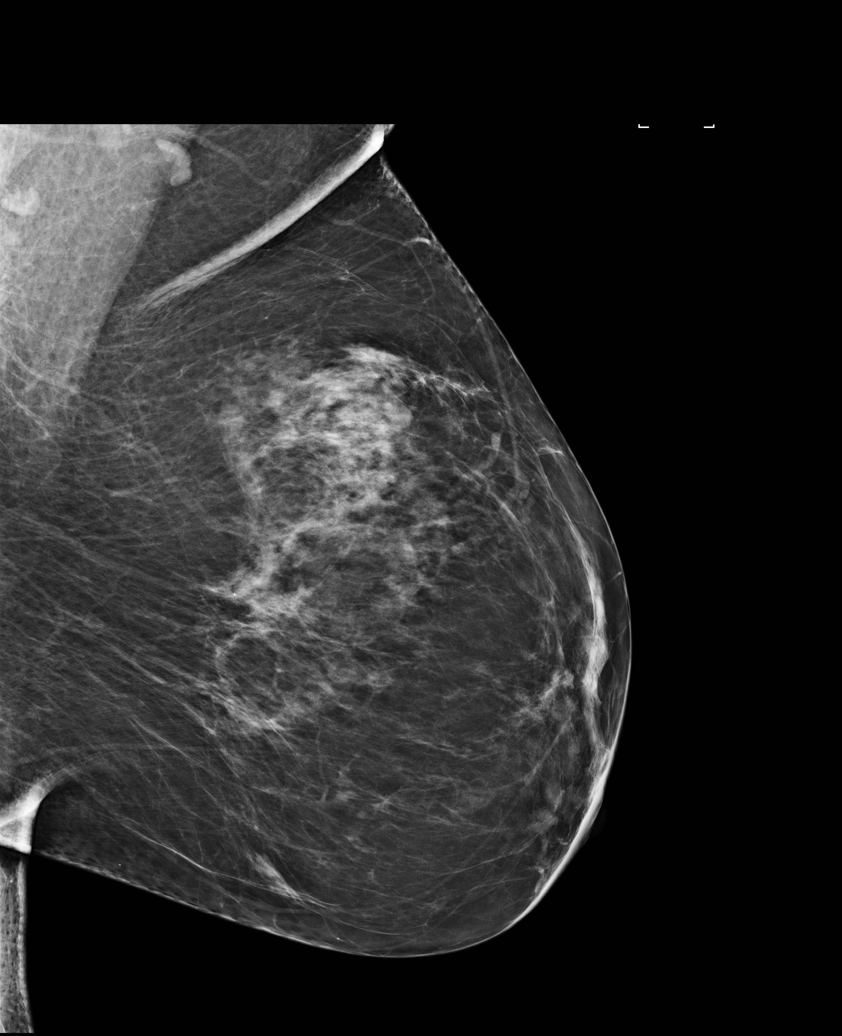

[L CC tomo · tomo slice 43/84.0]
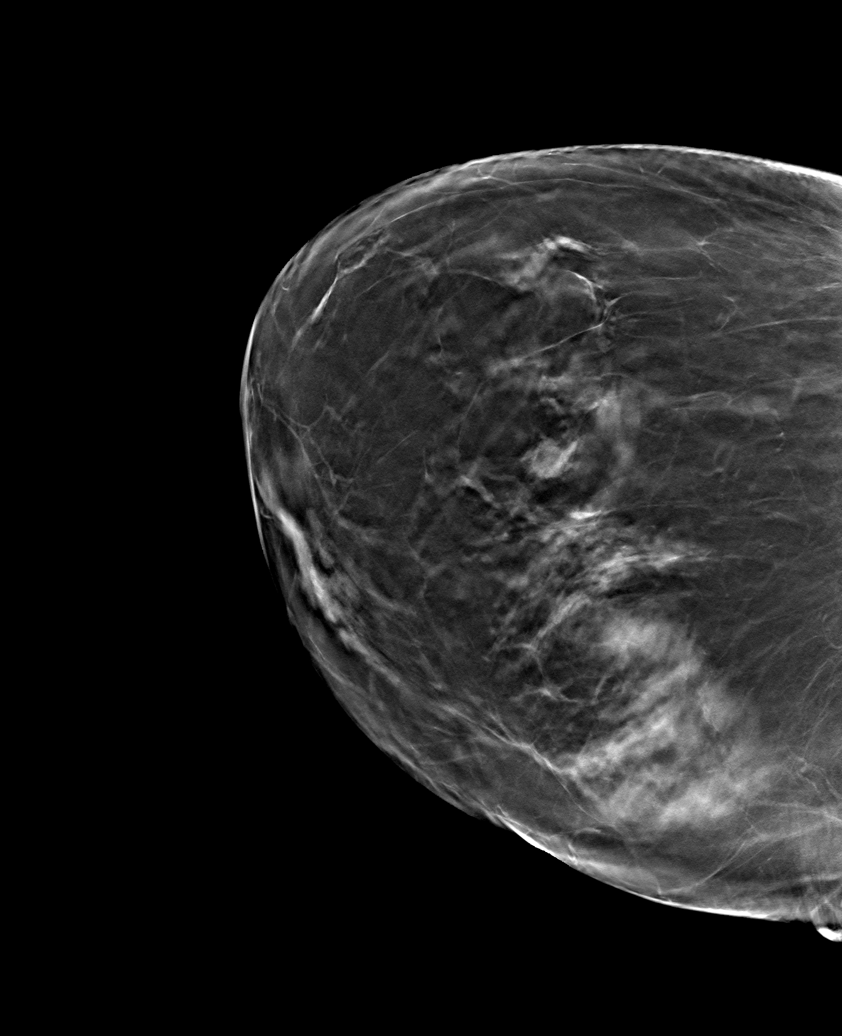

[6 of 25 positions shown; findings below may reference images not displayed]

ACR Breast Density Category b: There are scattered areas of
fibroglandular density.
FINDINGS: In the left breast, a possible mass warrants further evaluation. In
the right breast, no findings suspicious for malignancy.

Images were processed with CAD.
IMPRESSION: Further evaluation is suggested for possible mass in the left
breast.

RECOMMENDATION:
Diagnostic mammogram and possibly ultrasound of the left breast.
(Code:[id number])

The patient will be contacted regarding the findings, and additional
imaging will be scheduled.

BI-RADS CATEGORY  0: Incomplete. Need additional imaging evaluation
and/or prior mammograms for comparison.

## 2016-03-12 ENCOUNTER — Encounter: Payer: Self-pay | Admitting: Surgery

## 2016-03-18 ENCOUNTER — Ambulatory Visit (INDEPENDENT_AMBULATORY_CARE_PROVIDER_SITE_OTHER): Payer: Medicare Other | Admitting: Family Medicine

## 2016-03-18 VITALS — BP 122/74 | HR 117 | Temp 98.6°F | Ht 67.0 in | Wt 201.9 lb

## 2016-03-18 DIAGNOSIS — M2550 Pain in unspecified joint: Secondary | ICD-10-CM

## 2016-03-18 LAB — COMPREHENSIVE METABOLIC PANEL
ALT: 25 U/L (ref 0–35)
AST: 24 U/L (ref 0–37)
Albumin: 4.1 g/dL (ref 3.5–5.2)
Alkaline Phosphatase: 78 U/L (ref 39–117)
BILIRUBIN TOTAL: 0.5 mg/dL (ref 0.2–1.2)
BUN: 12 mg/dL (ref 6–23)
CALCIUM: 9.5 mg/dL (ref 8.4–10.5)
CO2: 29 meq/L (ref 19–32)
CREATININE: 0.69 mg/dL (ref 0.40–1.20)
Chloride: 101 mEq/L (ref 96–112)
GFR: 86.62 mL/min (ref >60.00)
Glucose, Bld: 107 mg/dL — ABNORMAL HIGH (ref 70–99)
Potassium: 4.4 mEq/L (ref 3.5–5.1)
Sodium: 137 mEq/L (ref 135–145)
Total Protein: 6.8 g/dL (ref 6.0–8.3)

## 2016-03-18 LAB — C-REACTIVE PROTEIN: CRP: 2.5 mg/dL (ref 0.5–20.0)

## 2016-03-18 LAB — SEDIMENTATION RATE: Sed Rate: 33 mm/hr — ABNORMAL HIGH (ref 0–22)

## 2016-03-18 NOTE — Patient Instructions (Signed)

## 2016-03-18 NOTE — Progress Notes (Signed)
Pre visit review using our clinic review tool, if applicable. No additional management support is needed unless otherwise documented below in the visit note. 

## 2016-03-18 NOTE — Progress Notes (Signed)
Subjective:    Patient ID: Mary Summers, female    DOB: [birth year], 82 y.o.   MRN: [id number]  HPI Patient seen for  acute visit with relatively acute polyarthralgias involving ankles, knees, hands, wrists, neck over the past few days. She has noticed some visible increased edema in her feet and hands. She denies any involvement of the shoulders or hips. Recent history is that she has had pulmonary workup for some dyspnea. Probable interstitial lung disease.  Multiple recent labs reviewed. She had normal CRP and sedimentation rate. Rheumatoid factor was elevated at 72 with normal CCP antibodies. ANA negative. Increased Sjogren's antibody.  She has positive family history of rheumatoid arthritis. She has known history of osteoarthritis Symptoms past 2 days are inconsistent with her  osteoarthritis and a relatively acute. No fevers or chills. Took some Aleve with mild relief. No history of CHF. No recent significant weight changes No associated skin rash. No fever.  Past Medical History  Diagnosis Date  . Seasonal allergies   . Arthritis   . Peripheral vascular disease ([place])     MILD CAROTID DISEASE BY DOPPLER STUDY  11/13/12. -REPORT FROM [facility]  . Hyperlipidemia    Past Surgical History  Procedure Laterality Date  . Congenitally absent uterus    . Breast surgery      BREAST BIOPSIES X 2  - NO CANCER  . Laser vocal cord for nodule  1985    . Total hip arthroplasty  11/29/2012    Procedure: TOTAL HIP ARTHROPLASTY ANTERIOR APPROACH;  Surgeon: Gearlean Alf, MD;  Location: [facility];  Service: Orthopedics;  Laterality: Left;  . Doppler echocardiography      reports that she has never smoked. She has never used smokeless tobacco. She reports that she drinks alcohol. She reports that she does not use illicit drugs. family history includes Arthritis in her mother and sister; Heart failure in her father. There is no history of Lung disease or  Rheumatologic disease. No Known Allergies    Review of Systems  Constitutional: Negative for fatigue.  Eyes: Negative for visual disturbance.  Respiratory: Negative for cough, chest tightness, shortness of breath and wheezing.   Cardiovascular: Negative for chest pain, palpitations and leg swelling.  Musculoskeletal: Positive for arthralgias, neck pain and neck stiffness.  Skin: Negative for rash.  Neurological: Negative for dizziness, seizures, syncope, weakness, light-headedness and headaches.  Hematological: Negative for adenopathy.       Objective:   Physical Exam  Constitutional: She is oriented to person, place, and time. She appears well-developed and well-nourished.  Neck: Neck supple. No thyromegaly present.  Cardiovascular: Normal rate and regular rhythm.   Pulmonary/Chest: Effort normal and breath sounds normal. No respiratory distress. She has no wheezes. She has no rales.  Musculoskeletal: She exhibits edema.  Mild nonpitting edema lower legs feet ankles bilaterally. She has some mild diffuse edema of all her digits She has changes consistent with osteoarthritis several digits of the hand (DIP and PIP). No warmth or erythema of any joints. She does have tenderness over the MCP joints but no visible swelling over those joints  Neurological: She is alert and oriented to person, place, and time.          Assessment & Plan:   Polyarthralgias. She has known history of osteoarthritis but is describing relatively acute worsening of symmetric polyarthralgias over the past few days. Recent labs as above with positive rheumatoid factor but negative CCP antibodies and  normal sedimentation rate and C-reactive protein. We explained elevated rheumatoid factor is nonspecific and in setting of normal sedimentation rate and C-reactive protein and ANA difficult to interpret. She did have positive Sjogren's antibodies.  Repeat sedimentation rate and C-reactive protein along with  comprehensive metabolic panel. Consider rheumatology referral.

## 2016-03-19 ENCOUNTER — Telehealth: Payer: Self-pay | Admitting: Internal Medicine

## 2016-03-19 MED ORDER — PREDNISONE 20 MG PO TABS: 20.0000 mg | ORAL_TABLET | Freq: Two times a day (BID) | ORAL | Status: DC

## 2016-03-19 NOTE — Telephone Encounter (Signed)
Lab okay Please call in a prescription for prednisone 20 mg #14 one twice a day  Office visit next week.  If unimproved

## 2016-03-19 NOTE — Telephone Encounter (Signed)
Pt saw burchette yesterday and still having swollen hand. Pt would like pain med and blood work results  [facility]

## 2016-03-19 NOTE — Telephone Encounter (Signed)
Spoke to pt, told her labs okay per Dr.K. Pt verbalized understanding. Told her Dr.K would like you to take Prednisone 20 mg one tablet twice a day x 7 days, Rx sent to pharmacy, if unimproved by Monday need office visit. Pt verbalized understanding.

## 2016-03-19 NOTE — Telephone Encounter (Signed)
Please see message and advise 

## 2016-03-22 ENCOUNTER — Inpatient Hospital Stay (HOSPITAL_COMMUNITY)
Admission: RE | Admit: 2016-03-22 | Discharge: 2016-03-22 | Disposition: A | Discharge status: Home / Self Care | Payer: Medicare Other | Source: Ambulatory Visit

## 2016-03-22 ENCOUNTER — Encounter: Payer: Medicare Other | Admitting: Surgery

## 2016-03-22 ENCOUNTER — Telehealth: Payer: Self-pay

## 2016-03-22 DIAGNOSIS — I8390 Asymptomatic varicose veins of unspecified lower extremity: Secondary | ICD-10-CM

## 2016-03-22 NOTE — Telephone Encounter (Signed)
Called patient. Gave lab results. Patient verbalized understanding. She was very grateful and pleased with Dr. Elease Hashimoto.

## 2016-03-22 NOTE — Telephone Encounter (Signed)
-----   Message from Eulas Post, MD sent at 03/21/2016 11:55 AM EDT ----- Sed rate up only slightly with normal CRP and normal chemistries.  Let her know I am recommending rheumatology follow up regarding her polyarthralgias and recent elevated rheumatoid factor.  I will put in rheumatology referral.

## 2016-03-24 NOTE — Addendum Note (Signed)
Addended by: Eulas Post on: 03/24/2016 08:28 AM   Modules accepted: Orders

## 2016-04-14 ENCOUNTER — Institutional Professional Consult (permissible substitution): Payer: Medicare Other | Admitting: Pulmonary Disease

## 2016-04-16 ENCOUNTER — Ambulatory Visit: Payer: Medicare Other

## 2016-04-16 ENCOUNTER — Ambulatory Visit: Payer: Medicare Other | Admitting: Pulmonary Disease

## 2016-04-22 ENCOUNTER — Encounter: Payer: Self-pay | Admitting: Pulmonary Disease

## 2016-04-22 ENCOUNTER — Ambulatory Visit (INDEPENDENT_AMBULATORY_CARE_PROVIDER_SITE_OTHER): Payer: Medicare Other | Admitting: Pulmonary Disease

## 2016-04-22 ENCOUNTER — Other Ambulatory Visit: Payer: Medicare Other

## 2016-04-22 ENCOUNTER — Telehealth: Payer: Self-pay | Admitting: Pulmonary Disease

## 2016-04-22 ENCOUNTER — Ambulatory Visit (HOSPITAL_COMMUNITY)
Admission: RE | Admit: 2016-04-22 | Discharge: 2016-04-22 | Disposition: A | Discharge status: Home / Self Care | Payer: Medicare Other | Source: Ambulatory Visit | Attending: Pulmonary Disease | Admitting: Pulmonary Disease

## 2016-04-22 VITALS — BP 136/60 | HR 110 | Ht 67.0 in | Wt 188.2 lb

## 2016-04-22 DIAGNOSIS — J849 Interstitial pulmonary disease, unspecified: Secondary | ICD-10-CM | POA: Insufficient documentation

## 2016-04-22 DIAGNOSIS — R0600 Dyspnea, unspecified: Secondary | ICD-10-CM

## 2016-04-22 LAB — PULMONARY FUNCTION TEST
DL/VA % pred: 69 %
DL/VA: 3.56 ml/min/mmHg/L
DLCO UNC % PRED: 62 %
DLCO UNC: 17.74 ml/min/mmHg
FEF 25-75 PRE: 2.9 L/s
FEF2575-%Pred-Pre: 194 %
FEV1-%Pred-Pre: 118 %
FEV1-Pre: 2.56 L
FEV1FVC-%Pred-Pre: 113 %
FEV6-%PRED-PRE: 112 %
FEV6-PRE: 3.1 L
FEV6FVC-%PRED-PRE: 105 %
FVC-%PRED-PRE: 107 %
FVC-PRE: 3.1 L
PRE FEV1/FVC RATIO: 83 %
PRE FEV6/FVC RATIO: 100 %

## 2016-04-22 NOTE — Progress Notes (Signed)
Test reviewed.  

## 2016-04-22 NOTE — Patient Instructions (Signed)
   Call me if you have any new breathing problems before your next appointment.  I will see you back in July before you leave with a walking and breathing test at the next appointment.  TESTS ORDERED: 1. Spirometry with DLCO at next appointment 2. 6MWT on room air at next appointment

## 2016-04-22 NOTE — Addendum Note (Signed)
Addended by: Raymondo Band D on: 04/22/2016 03:31 PM   Modules accepted: Orders

## 2016-04-22 NOTE — Progress Notes (Addendum)
Subjective:    Patient ID: TERSA Summers, female    DOB: 30-Nov-1932, 82 y.o.   MRN: [id number]  C.C.:  Follow-up for ILD.  HPI  ILD:  Predominantly NSIP pattern on high-resolution CT imaging. Mediastinal/hilar adenopathy with calcification as well as lung nodule suspicious for sarcoidosis. She continues to have intermittent coughing productive of a minimal amount of sputum. She feels her dyspnea is slightly better. She reports she was seen by rheumatology this morning and had repeat blood work. Denies any eye erythema, blurry vision, or eye pain. No palpitations. She reports no recent syncope or near syncope.   Review of Systems  She reports she did have a significant episode of arthritis since her last appointment with joint swelling. She denies any nausea, emesis, or reflux. No chest pain or pressure.   No Known Allergies  Current Outpatient Prescriptions on File Prior to Visit  Medication Sig Dispense Refill  . Ascorbic Acid (VITAMIN C PO) Take 1 tablet by mouth daily.    Marland Kitchen aspirin 81 MG tablet Take 81 mg by mouth daily.    Marland Kitchen atorvastatin (LIPITOR) 40 MG tablet TAKE 1 TABLET EVERY DAY 90 tablet 3  . beta carotene 30 MG capsule Take 30 mg by mouth daily.    Marland Kitchen BIOTIN PO Take 1 tablet by mouth daily.    . calcium gluconate 500 MG tablet Take 2 tablets by mouth daily.    . ciclopirox (PENLAC) 8 % solution Apply topically at bedtime. Apply over nail and surrounding skin. Apply daily over previous coat. After seven (7) days, may remove with alcohol and continue cycle. 6.6 mL 0  . co-enzyme Q-10 30 MG capsule Take 30 mg by mouth daily.    . fluticasone (FLONASE) 50 MCG/ACT nasal spray Place 1 spray into both nostrils daily as needed for allergies or rhinitis. ONCE A DAY IF NEEDED 16 g 2  . glucosamine-chondroitin 500-400 MG tablet Take 2 tablets by mouth daily.    . Multiple Vitamin (MULTIVITAMIN) tablet Take 1 tablet by mouth daily.     . Multiple Vitamins-Minerals (ZINC PO) Take 1 tablet  by mouth daily.    . Omega-3 Fatty Acids (OMEGA 3 PO) Take 1 capsule by mouth daily.    . SELENIUM PO Take 1 tablet by mouth daily.    . Vitamin D, Cholecalciferol, 1000 units CAPS Take 1 tablet by mouth daily.    . vitamin E 400 UNIT capsule Take 400 Units by mouth daily.     No current facility-administered medications on file prior to visit.    Past Medical History  Diagnosis Date  . Seasonal allergies   . Arthritis   . Peripheral vascular disease ([place])     MILD CAROTID DISEASE BY DOPPLER STUDY  11/13/12. -REPORT FROM [facility]  . Hyperlipidemia     Past Surgical History  Procedure Laterality Date  . Congenitally absent uterus    . Breast surgery      BREAST BIOPSIES X 2  - NO CANCER  . Laser vocal cord for nodule  1985    . Total hip arthroplasty  11/29/2012    Procedure: TOTAL HIP ARTHROPLASTY ANTERIOR APPROACH;  Surgeon: Gearlean Alf, MD;  Location: [facility];  Service: Orthopedics;  Laterality: Left;  . Doppler echocardiography      Family History  Problem Relation Age of Onset  . Arthritis Mother   . Heart failure Father   . Arthritis Sister   . Lung  disease Neg Hx   . Rheumatologic disease Neg Hx     Social History   Social History  . Marital Status: Married    Spouse Name: N/A  . Number of Children: 0  . Years of Education: N/A   Occupational History  . retired    Social History Main Topics  . Smoking status: Never Smoker   . Smokeless tobacco: Never Used  . Alcohol Use: 0.0 oz/week    0 Standard drinks or equivalent per week     Comment: OCCASIONAL ALCOHOL  . Drug Use: No  . Sexual Activity: Not Asked   Other Topics Concern  . None   Social History Narrative   Originally from [place], [place]. She lived for 14 years in [place]. She does live in the summer in [place]. Previously lived in [place]. Has previous travel to [place], [place], & [place]. 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 She reports he did work with some rare exotic woods from [place] & [place]. She does report chemical exposure to the spray booth in his shop. No bird exposure. She does have a hot tub but uses it irregularly.       Objective:   Physical Exam BP 136/60 mmHg  Pulse 110  Ht 5\' 7"  (1.702 m)  Wt 188 lb 3.2 oz (85.367 kg)  BMI 29.47 kg/m2  SpO2 95% General:  Awake. Alert. No distress. Accompanied by husband today. Integument:  Warm & dry. No rash on exposed skin.  Lymphatics:  No appreciated cervical or supraclavicular lymphadenoapthy. HEENT:  Moist mucus membranes. No oral ulcers. No scleral injection.  Cardiovascular:  Regular rate. Trace edema. No appreciable JVD.  Pulmonary:  Faint bilateral basilar crackles unchanged. Normal work of breathing on room air. Abdomen: Soft. Normal bowel sounds. Nondistended. Grossly nontender.  PFT 04/22/16: FVC 3.10 L (107%) FEV1 2.56 L (118%) FEV1/FVC 0.83 FEF 25-75 2.90 L (194%)                                                                                                                 DLCO uncorrected 62% 01/05/16: FVC 3.14 L (107%) FEV1 2.60 L (119%) FEV1/FVC 0.83 FEF 25-75 2.95 L (196%) no bronchodilator response TLC 5.87 L (106%) RV 102% ERV 81% DLCO uncorrected 65%  6MWT 04/22/16:  Walked 357 meters / Baseline Sat 96% on RA / Nadir Sat 95% on RA  IMAGING HRCT CHEST W/O 03/02/16 (personally reviewed by me): Sternal and hilar adenopathy measuring up to 1.6 cm with associated calcification. No axillary adenopathy. There is some mosaic attenuation with air trapping on exhalation indicative of small airway disease. Upperintralobular septal thickening that is mild is noted.  lobe nodules with the largest measuring 2.9 x 1.0 cm. Many of these are partially calcified. No honeycomb changes, or bronchiectasis appreciated. No groundglass. No pericardial effusion. No pleural effusion or thickening.  CXR PA/LAT 12/26/15  (previously reviewed by me): Progressively worsening bilateral interstitial prominence. Irregular opacity Right upper  lobe seems to be slightly enlarging. No pleural effusion or thickening appreciated. Heart normal in size. Mediastinum normal in contour.  LABS 02/25/16 ANA:  Negative Anti-CCP:  <16 RF:  72 SSA:  6.9 SSB:  <1  12/24/15 CBC: 6.1/15.1/45.8/203 BMP: 142/4.2/104/28/14/0.63/92/9.6 LFT: 4.2/6.9/0.6/88/20/19    Assessment & Plan:  82 year old female with prior exposure to hot tubs as well as possible exotic woods.patient is very mild interstitial lung disease on her high-resolution CT imaging. Prior to be consistent with sarcoidosis reviewing Sjogren syndrome. Given her improving symptoms I do not feel that immunosuppression necessary at this time. I reviewed patient's spirometry as well as her high-resolution CT scan with both she and her husband who was present today. There has been a small but significant decline in spirometry since her previous testing. I feel this warrants further monitoring. Patient has been evaluated by rheumatology and I will await their formal assessment. Patient has had repeat serum testing performed today as well. I instructed the patient to contact my office if she had any new breathing problems before next appointment.  1. Interstitial lung disease: Possible sarcoidosis versus Sjogren syndrome. Holding on immunosuppression. Await evaluation by rheumatology. Checking spirometry with DLCO & 6 minute walk test at next appointment. Checking ACE level today. 2. Follow-up: Patient to return to clinic in July 2018.  Mary Summers, M.D. [facility] Pulmonary & Critical Care Pager:  [phone] After 3pm or if no response, call [phone] 3:30 PM 04/22/2016

## 2016-04-22 NOTE — Telephone Encounter (Signed)
LVM for pt to return call

## 2016-04-23 LAB — ANGIOTENSIN CONVERTING ENZYME: ANGIOTENSIN-CONVERTING ENZYME: 72 U/L — AB (ref 8–52)

## 2016-04-23 NOTE — Telephone Encounter (Signed)
lmtcb x2 for pt. 

## 2016-04-23 NOTE — Telephone Encounter (Signed)
Spoke with pt. She has been scheduled. 6MW - 06/25/16 at 3pm. PFT - 06/25/16 at 4pm. ROV - 07/02/16 at 10:15am. Nothing further was needed.

## 2016-04-23 NOTE — Telephone Encounter (Signed)
Patient called states returning Lindsay's call - prm

## 2016-04-23 NOTE — Telephone Encounter (Signed)
Pt returning call.Mary Summers ° °

## 2016-04-23 NOTE — Telephone Encounter (Signed)
lmtcb for pt. Appointments will have to scheduled on 2 different days.

## 2016-06-24 ENCOUNTER — Telehealth: Payer: Self-pay | Admitting: Pulmonary Disease

## 2016-06-24 NOTE — Telephone Encounter (Signed)
Spoke with pt. She wants to know if taking prednisone and an antibiotic will interfere with her SMW and PFT tomorrow. Advised her that it would not. Nothing further was needed.

## 2016-06-25 ENCOUNTER — Ambulatory Visit (INDEPENDENT_AMBULATORY_CARE_PROVIDER_SITE_OTHER): Payer: Medicare Other | Admitting: Pulmonary Disease

## 2016-06-25 DIAGNOSIS — J849 Interstitial pulmonary disease, unspecified: Secondary | ICD-10-CM

## 2016-06-25 DIAGNOSIS — R0600 Dyspnea, unspecified: Secondary | ICD-10-CM

## 2016-06-25 LAB — PULMONARY FUNCTION TEST
DL/VA % pred: 68 %
DL/VA: 3.54 ml/min/mmHg/L
DLCO COR: 17.82 ml/min/mmHg
DLCO UNC % PRED: 67 %
DLCO cor % pred: 62 %
DLCO unc: 19.19 ml/min/mmHg
FEF 25-75 PRE: 2.8 L/s
FEF2575-%Pred-Pre: 189 %
FEV1-%Pred-Pre: 121 %
FEV1-PRE: 2.62 L
FEV1FVC-%Pred-Pre: 111 %
FEV6-%Pred-Pre: 116 %
FEV6-Pre: 3.2 L
FEV6FVC-%Pred-Pre: 105 %
FVC-%PRED-PRE: 111 %
FVC-Pre: 3.23 L
Pre FEV1/FVC ratio: 81 %
Pre FEV6/FVC Ratio: 99 %

## 2016-06-25 NOTE — Progress Notes (Signed)
Spirometry and dlco done today. 

## 2016-06-26 NOTE — Progress Notes (Signed)
PFT 06/25/16: FVC 3.23 L (111%) FEV1 2.16 L (121%) FEV1/FVC 0.81 FEF 25-75 2.80 L (189%) DLCO corrected 62% (hemoglobin 16.2)  6MWT 06/25/16:  Walked 357 meters / Baseline Sat 96% on RA / Nadir Sat 96% on RA

## 2016-06-28 ENCOUNTER — Telehealth: Payer: Self-pay | Admitting: Internal Medicine

## 2016-06-28 NOTE — Telephone Encounter (Signed)
Pt would like to have the results from [facility] coming from [place] on the biopsies of mosquitoes bites.

## 2016-06-29 NOTE — Telephone Encounter (Signed)
Left message to call office, not sure what results you are wanting.

## 2016-06-29 NOTE — Telephone Encounter (Signed)
Pt called back, told her I am not sure what results she is talking about. Pt said they were suppose to fax labs over from [facility]. Told pt let me look through my faxes I received today. Told pt found them I will make sure Dr.K sees them. Pt verbalized understanding.

## 2016-07-02 ENCOUNTER — Ambulatory Visit (INDEPENDENT_AMBULATORY_CARE_PROVIDER_SITE_OTHER): Payer: Medicare Other | Admitting: Pulmonary Disease

## 2016-07-02 ENCOUNTER — Encounter: Payer: Self-pay | Admitting: Pulmonary Disease

## 2016-07-02 VITALS — BP 166/80 | HR 110 | Ht 67.0 in | Wt 187.6 lb

## 2016-07-02 DIAGNOSIS — J849 Interstitial pulmonary disease, unspecified: Secondary | ICD-10-CM

## 2016-07-02 NOTE — Progress Notes (Signed)
Subjective:    Patient ID: Mary Summers, female    DOB: 07-12-1932, 82 y.o.   MRN: [id number]  C.C.:  Follow-up for ILD.  HPI   She returns from her travels with a "cold". She was on Prendnisone & an antibiotic.   ILD:  Predominantly NSIP pattern on high-resolution CT imaging. Mediastinal/hilar adenopathy with calcification as well as lung nodule suspicious for sarcoidosis. She reports continued intermittent dyspnea & needing to "grab some extra air". She reports her cough is intermittent now and returning to her baseline.   Review of Systems  No chest pain or pressure. No fever, chills, or sweats. No nausea or emesis.   No Known Allergies  Current Outpatient Prescriptions on File Prior to Visit  Medication Sig Dispense Refill  . Ascorbic Acid (VITAMIN C PO) Take 1 tablet by mouth daily.    Marland Kitchen aspirin 81 MG tablet Take 81 mg by mouth daily.    Marland Kitchen atorvastatin (LIPITOR) 40 MG tablet TAKE 1 TABLET EVERY DAY 90 tablet 3  . beta carotene 30 MG capsule Take 30 mg by mouth daily.    Marland Kitchen BIOTIN PO Take 1 tablet by mouth daily.    . calcium gluconate 500 MG tablet Take 2 tablets by mouth daily.    . ciclopirox (PENLAC) 8 % solution Apply topically at bedtime. Apply over nail and surrounding skin. Apply daily over previous coat. After seven (7) days, may remove with alcohol and continue cycle. 6.6 mL 0  . co-enzyme Q-10 30 MG capsule Take 30 mg by mouth daily.    . fluticasone (FLONASE) 50 MCG/ACT nasal spray Place 1 spray into both nostrils daily as needed for allergies or rhinitis. ONCE A DAY IF NEEDED 16 g 2  . glucosamine-chondroitin 500-400 MG tablet Take 2 tablets by mouth daily.    . Multiple Vitamin (MULTIVITAMIN) tablet Take 1 tablet by mouth daily.     . Multiple Vitamins-Minerals (ZINC PO) Take 1 tablet by mouth daily.    . Omega-3 Fatty Acids (OMEGA 3 PO) Take 1 capsule by mouth daily.    . SELENIUM PO Take 1 tablet by mouth daily.    . Vitamin D, Cholecalciferol, 1000 units CAPS  Take 1 tablet by mouth daily.    . vitamin E 400 UNIT capsule Take 400 Units by mouth daily.     No current facility-administered medications on file prior to visit.    Past Medical History  Diagnosis Date  . Seasonal allergies   . Arthritis   . Peripheral vascular disease ([place])     MILD CAROTID DISEASE BY DOPPLER STUDY  11/13/12. -REPORT FROM [facility]  . Hyperlipidemia     Past Surgical History  Procedure Laterality Date  . Congenitally absent uterus    . Breast surgery      BREAST BIOPSIES X 2  - NO CANCER  . Laser vocal cord for nodule  1985    . Total hip arthroplasty  11/29/2012    Procedure: TOTAL HIP ARTHROPLASTY ANTERIOR APPROACH;  Surgeon: Gearlean Alf, MD;  Location: [facility];  Service: Orthopedics;  Laterality: Left;  . Doppler echocardiography      Family History  Problem Relation Age of Onset  . Arthritis Mother   . Heart failure Father   . Arthritis Sister   . Lung disease Neg Hx   . Rheumatologic disease Neg Hx     Social History   Social History  . Marital Status: Married  Spouse Name: N/A  . Number of Children: 0  . Years of Education: N/A   Occupational History  . retired    Social History Main Topics  . Smoking status: Never Smoker   . Smokeless tobacco: Never Used  . Alcohol Use: 0.0 oz/week    0 Standard drinks or equivalent per week     Comment: OCCASIONAL ALCOHOL  . Drug Use: No  . Sexual Activity: Not Asked   Other Topics Concern  . None   Social History Narrative   Originally from [place], [place]. She lived for 14 years in [place]. She does live in the summer in [place]. Previously lived in [place]. Has previous travel to [place], [place], & [place]. 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 She reports he did work with some rare exotic woods from [place] & [place]. She does report chemical exposure to the spray booth in his shop. No bird  exposure. She does have a hot tub but uses it irregularly.       Objective:   Physical Exam BP 166/80 mmHg  Pulse 110  Ht 5\' 7"  (1.702 m)  Wt 187 lb 9.6 oz (85.095 kg)  BMI 29.38 kg/m2  SpO2 97% General:  Awake. Alert. No distress. Accompanied by husband today. Integument:  Warm & dry. No rash on exposed skin. Bruising and healing in left toes. Lymphatics:  No appreciated cervical or supraclavicular lymphadenoapthy. HEENT:  Moist mucus membranes. No oral ulcers. No scleral injection. Mild bilateral nasal turbinate swelling. Cardiovascular:  Slightly tachycardic with regular rhythm. Normal S1 & S2. No edema.   Pulmonary:  Faint bilateral basilar crackles unchanged and possibly mildly improved. Normal work of breathing on room air. speaking in complete sentences.  Abdomen: Soft. Normal bowel sounds. Nondistended.   PFT 06/25/16: FVC 3.23 L (111%) FEV1 2.16 L (121%) FEV1/FVC 0.81 FEF 25-75 2.80 L (189%)                                                                                                                 DLCO corrected 62% (hemoglobin 16.2) 04/22/16: FVC 3.10 L (107%) FEV1 2.56 L (118%) FEV1/FVC 0.83 FEF 25-75 2.90 L (194%)                                                                                                                 DLCO uncorrected 62% 01/05/16: FVC 3.14 L (107%) FEV1 2.60 L (119%) FEV1/FVC 0.83 FEF 25-75 2.95 L (196%) no bronchodilator response TLC 5.87  L (106%) RV 102% ERV 81% DLCO uncorrected 65%  6MWT 06/25/16: Walked 357 meters / Baseline Sat 96% on RA / Nadir Sat 96% on RA 04/22/16:  Walked 357 meters / Baseline Sat 96% on RA / Nadir Sat 95% on RA  IMAGING HRCT CHEST W/O 03/02/16 (previously reviewed by me): Sternal and hilar adenopathy measuring up to 1.6 cm with associated calcification. No axillary adenopathy. There is some mosaic attenuation with air trapping on exhalation indicative of small airway disease. Upperintralobular septal thickening that is  mild is noted.  lobe nodules with the largest measuring 2.9 x 1.0 cm. Many of these are partially calcified. No honeycomb changes, or bronchiectasis appreciated. No groundglass. No pericardial effusion. No pleural effusion or thickening.  CXR PA/LAT 12/26/15 (previously reviewed by me): Progressively worsening bilateral interstitial prominence. Irregular opacity Right upper lobe seems to be slightly enlarging. No pleural effusion or thickening appreciated. Heart normal in size. Mediastinum normal in contour.  LABS 04/22/16 ACE:  72  02/25/16 ANA:  Negative Anti-CCP:  <16 RF:  72 SSA:  6.9 SSB:  <1  12/24/15 CBC: 6.1/15.1/45.8/203 BMP: 142/4.2/104/28/14/0.63/92/9.6 LFT: 4.2/6.9/0.6/88/20/19    Assessment & Plan:  82 year old female with prior exposure to hot tubs as well as possible exotic woods.Patient's FVC remained stable as does her 6 minute walk test suggesting continued stability in her interstitial lung disease. I believe the decline in her FEV1 is likely secondary to her recent respiratory illness. Given this continued stability I do not feel that initiating immunosuppression at this time his warranted. I did caution the patient to notify me if she had any new breathing problems as I would then like to see her back sooner. Although, she is traveling overseas soon and will be unavailable.   1. Interstitial Lung Disease: Possible Sarcoidosis versus Sjogren syndrome. Continuing to hold on immunosuppression at this time. Repeat spirometry with DLCO and 6 minute walk test at next appointment. 2. Health Maintenance:  Status post Prevnar December 2014 & Pneumovax January 2010.  3. Follow-up: Patient to return to clinic in 3 months or sooner if needed.  Sonia Baller Ashok Cordia, M.D. [facility] Pulmonary & Critical Care Pager:  [phone] After 3pm or if no response, call [phone] 10:48 AM 07/02/2016

## 2016-07-02 NOTE — Patient Instructions (Addendum)
   Call me if you have any new breathing problems before your next appointment.  Try keeping a journal of your breathing and symptoms.  I will see you back after you return from your trip.  TESTS ORDERED: 1. Spirometry with DLCO next appointment 2. 6 minute walk test on room air at next appointment

## 2016-11-02 ENCOUNTER — Ambulatory Visit (INDEPENDENT_AMBULATORY_CARE_PROVIDER_SITE_OTHER): Payer: Medicare Other | Admitting: Pulmonary Disease

## 2016-11-02 ENCOUNTER — Encounter: Payer: Self-pay | Admitting: Pulmonary Disease

## 2016-11-02 VITALS — BP 132/70 | HR 94 | Ht 67.0 in | Wt 191.0 lb

## 2016-11-02 DIAGNOSIS — J849 Interstitial pulmonary disease, unspecified: Secondary | ICD-10-CM

## 2016-11-02 DIAGNOSIS — Z23 Encounter for immunization: Secondary | ICD-10-CM

## 2016-11-02 DIAGNOSIS — J302 Other seasonal allergic rhinitis: Secondary | ICD-10-CM | POA: Diagnosis not present

## 2016-11-02 DIAGNOSIS — R0602 Shortness of breath: Secondary | ICD-10-CM

## 2016-11-02 LAB — PULMONARY FUNCTION TEST
DL/VA % pred: 77 %
DL/VA: 3.99 ml/min/mmHg/L
DLCO UNC: 20.63 ml/min/mmHg
DLCO cor % pred: 69 %
DLCO cor: 19.58 ml/min/mmHg
DLCO unc % pred: 72 %
FEF 25-75 Pre: 2.74 L/sec
FEF2575-%PRED-PRE: 188 %
FEV1-%PRED-PRE: 116 %
FEV1-PRE: 2.51 L
FEV1FVC-%Pred-Pre: 112 %
FEV6-%Pred-Pre: 112 %
FEV6-Pre: 3.07 L
FEV6FVC-%PRED-PRE: 105 %
FVC-%PRED-PRE: 106 %
FVC-PRE: 3.07 L
PRE FEV1/FVC RATIO: 82 %
Pre FEV6/FVC Ratio: 100 %

## 2016-11-02 NOTE — Progress Notes (Signed)
Subjective:    Patient ID: Mary Summers, female    DOB: [birth year], 82 y.o.   MRN: [id number]  C.C.:  Follow-up for ILD/NSIP Pattern & Chronic Seasonal Allergic Rhinitis.  HPI   ILD:  Predominantly NSIP pattern on high-resolution CT imaging. Mediastinal/hilar adenopathy with calcification as well as lung nodule suspicious for Sarcoidosis vs Sjogrens Syndrome. She reports she has had a mild cough in the morning. The cough is mostly nonproductive. She denies any wheezing. She denies any new breathing problems.   Chronic Seasonal Allergic Rhinitis:  She reports she has had increased sinus congestion, pressure, and drainage since returning to the [place] from [place]. Denies any facial pain. She has not been taking anything over-the-counter. Previously was doing a saline rinse.   Review of Systems  No chest pain, pressure, or tightness. No fever, chills, or sweats. No new rashes or joint pain, stiffness, or swelling.   No Known Allergies  Current Outpatient Prescriptions on File Prior to Visit  Medication Sig Dispense Refill  . Ascorbic Acid (VITAMIN C PO) Take 1 tablet by mouth daily.    Marland Kitchen aspirin 81 MG tablet Take 81 mg by mouth daily.    Marland Kitchen atorvastatin (LIPITOR) 40 MG tablet TAKE 1 TABLET EVERY DAY 90 tablet 3  . beta carotene 30 MG capsule Take 30 mg by mouth daily.    . calcium gluconate 500 MG tablet Take 2 tablets by mouth daily.    . ciclopirox (PENLAC) 8 % solution Apply topically at bedtime. Apply over nail and surrounding skin. Apply daily over previous coat. After seven (7) days, may remove with alcohol and continue cycle. 6.6 mL 0  . co-enzyme Q-10 30 MG capsule Take 30 mg by mouth daily.    . fluticasone (FLONASE) 50 MCG/ACT nasal spray Place 1 spray into both nostrils daily as needed for allergies or rhinitis. ONCE A DAY IF NEEDED 16 g 2  . glucosamine-chondroitin 500-400 MG tablet Take 2 tablets by mouth daily.    . Multiple Vitamin (MULTIVITAMIN) tablet Take 1 tablet by mouth  daily.     . Omega-3 Fatty Acids (OMEGA 3 PO) Take 1 capsule by mouth daily.    . SELENIUM PO Take 1 tablet by mouth daily.    . Vitamin D, Cholecalciferol, 1000 units CAPS Take 1 tablet by mouth daily.    . vitamin E 400 UNIT capsule Take 400 Units by mouth daily.     No current facility-administered medications on file prior to visit.     Past Medical History:  Diagnosis Date  . Arthritis   . Hyperlipidemia   . Peripheral vascular disease ([place])    MILD CAROTID DISEASE BY DOPPLER STUDY  11/13/12. -REPORT FROM [facility]  . Seasonal allergies     Past Surgical History:  Procedure Laterality Date  . BREAST SURGERY     BREAST BIOPSIES X 2  - NO CANCER  . congenitally absent uterus    . DOPPLER ECHOCARDIOGRAPHY    . LASER VOCAL CORD FOR NODULE  1985    . TOTAL HIP ARTHROPLASTY  11/29/2012   Procedure: TOTAL HIP ARTHROPLASTY ANTERIOR APPROACH;  Surgeon: Gearlean Alf, MD;  Location: [facility];  Service: Orthopedics;  Laterality: Left;    Family History  Problem Relation Age of Onset  . Arthritis Mother   . Heart failure Father   . Arthritis Sister   . Lung disease Neg Hx   . Rheumatologic disease Neg Hx  Social History   Social History  . Marital status: Married    Spouse name: N/A  . Number of children: 0  . Years of education: N/A   Occupational History  . retired    Social History Main Topics  . Smoking status: Never Smoker  . Smokeless tobacco: Never Used  . Alcohol use 0.0 oz/week     Comment: OCCASIONAL ALCOHOL  . Drug use: No  . Sexual activity: Not Asked   Other Topics Concern  . None   Social History Narrative   Originally from [place], [place]. She lived for 14 years in [place]. She does live in the summer in [place]. Previously lived in [place]. Has previous travel to [place], [place], & [place]. 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 She reports he did  work with some rare exotic woods from [place] & [place]. She does report chemical exposure to the spray booth in his shop. No bird exposure. She does have a hot tub but uses it irregularly.       Objective:   Physical Exam BP 132/70 (BP Location: Left Arm, Cuff Size: Normal)   Pulse 94   Ht 5\' 7"  (1.702 m)   Wt 191 lb (86.6 kg)   SpO2 97%   BMI 29.91 kg/m  General:  Awake. Alert. Accompanied by husband today. Well-dressed. Integument:  Warm & dry. No rash or bruising on exposed skin.  Lymphatics:  No appreciated cervical or supraclavicular lymphadenoapthy. HEENT:  Moist mucus membranes. Mild bilateral nasal turbinate swelling unchanged. No sinus tenderness to palpation. Cardiovascular:  Regular rate & rhythm. Normal S1 & S2. No edema.   Pulmonary:  Good aeration bilaterally with no appreciated crackles in bases. Normal work of breathing on room air & speaking in complete sentences. Abdomen: Soft. Normal bowel sounds. Nondistended.  Musculoskeletal:  No joint effusion. DIP & PIP swelling & deformities unchanged.   PFT 11/02/16: FVC 3.07 L (106%) FEV1 2.51 L (116%) FEV1/FVC 0.82 FEF 25-75 2.74 L (188%)                                                                                                                 DLCO corrected 69% (Hgb 15.3) 06/25/16: FVC 3.23 L (111%) FEV1 2.16 L (121%) FEV1/FVC 0.81 FEF 25-75 2.80 L (189%)                                                                                                                 DLCO corrected 62% (  hemoglobin 16.2) 04/22/16: FVC 3.10 L (107%) FEV1 2.56 L (118%) FEV1/FVC 0.83 FEF 25-75 2.90 L (194%)                                                                                                                 DLCO uncorrected 62% 01/05/16: FVC 3.14 L (107%) FEV1 2.60 L (119%) FEV1/FVC 0.83 FEF 25-75 2.95 L (196%) no bronchodilator response TLC 5.87 L (106%) RV 102% ERV 81% DLCO uncorrected 65%  6MWT 11/02/16:  Walked 432 meters /  Baseline Sat 98% on RA / Nadir Sat 98% on RA @ rest 06/25/16: Walked 357 meters / Baseline Sat 96% on RA / Nadir Sat 96% on RA 04/22/16:  Walked 357 meters / Baseline Sat 96% on RA / Nadir Sat 95% on RA  IMAGING HRCT CHEST W/O 03/02/16 (previously reviewed by me): Sternal and hilar adenopathy measuring up to 1.6 cm with associated calcification. No axillary adenopathy. There is some mosaic attenuation with air trapping on exhalation indicative of small airway disease. Upperintralobular septal thickening that is mild is noted.  lobe nodules with the largest measuring 2.9 x 1.0 cm. Many of these are partially calcified. No honeycomb changes, or bronchiectasis appreciated. No groundglass. No pericardial effusion. No pleural effusion or thickening.  CXR PA/LAT 12/26/15 (previously reviewed by me): Progressively worsening bilateral interstitial prominence. Irregular opacity Right upper lobe seems to be slightly enlarging. No pleural effusion or thickening appreciated. Heart normal in size. Mediastinum normal in contour.  LABS 04/22/16 ACE:  72  02/25/16 ANA:  Negative Anti-CCP:  <16 RF:  72 SSA:  6.9 SSB:  <1  12/24/15 CBC: 6.1/15.1/45.8/203 BMP: 142/4.2/104/28/14/0.63/92/9.6 LFT: 4.2/6.9/0.6/88/20/19    Assessment & Plan:  82 y.o. female with prior exposure to hot tubs as well as possible exotic woods and underlying interstitial lung disease. The patient's serum workup did suggest the possibility of underlying sarcoidosis versus Sjogren syndrome. Given her continued clinical stability and stability in her pulmonary function testing and 6 minute walk test I do not feel immunosuppression is warranted at this time. She does seem to be having more problems from her seasonal allergic rhinitis which may benefit from nasal saline rinse as it has before. I instructed the patient to contact my office if she had any new breathing problems or ongoing issues with her allergic rhinitis.  1. Interstitial Lung  Disease/NSIP Pattern: Sarcoidosis versus Sjogren syndrome. Patient's 6 minute walk test remains stable. Her spirometry overall remains stable with some mild fluctuance. Continuing to hold on immunosuppression at this time. Repeat spirometry with DLCO and 6 minute walk test on room air at next appointment. 2. Chronic Seasonal Allergic Rhinitis:  Recommended twice daily nasal saline rinse. Holding on intranasal steroid for now. 3. Health Maintenance:  S/P Prevnar December 2014 & Pneumovax January 2010. Administering high-dose influenza vaccine today. 4. Follow-up: Patient to return to clinic in 6 months or sooner if needed.  Sonia Baller Ashok Cordia, M.D. [facility] Pager:  [phone] After 3pm or if no response, call  [phone] 10:21 AM 11/02/16

## 2016-11-02 NOTE — Progress Notes (Signed)
Test reviewed.  

## 2016-11-02 NOTE — Patient Instructions (Signed)
   Call me if you have any new breathing problems before your next appointment.  Try using a nasal saline rinse twice daily to help with your sinus congestion. I recommend NeilMed or a nasal saline rinse with a similar bottle which can be purchased over-the-counter.  Call me if you continue to have sinus congestion that is not relieved with the sinus rinse.  I will see you back in 6 months with a walking and breathing test at that time or sooner if needed.  TESTS ORDERED: 1. Spirometry with DLCO at follow-up 2. 6MWT on room air at follow-up

## 2016-12-24 ENCOUNTER — Encounter: Payer: Self-pay | Admitting: Internal Medicine

## 2016-12-24 ENCOUNTER — Ambulatory Visit (INDEPENDENT_AMBULATORY_CARE_PROVIDER_SITE_OTHER): Payer: Medicare Other | Admitting: Internal Medicine

## 2016-12-24 VITALS — BP 138/72 | HR 88 | Temp 98.4°F | Ht 67.0 in | Wt 194.5 lb

## 2016-12-24 DIAGNOSIS — I6523 Occlusion and stenosis of bilateral carotid arteries: Secondary | ICD-10-CM | POA: Diagnosis not present

## 2016-12-24 DIAGNOSIS — I739 Peripheral vascular disease, unspecified: Secondary | ICD-10-CM | POA: Diagnosis not present

## 2016-12-24 DIAGNOSIS — Z0000 Encounter for general adult medical examination without abnormal findings: Secondary | ICD-10-CM

## 2016-12-24 DIAGNOSIS — J849 Interstitial pulmonary disease, unspecified: Secondary | ICD-10-CM | POA: Diagnosis not present

## 2016-12-24 DIAGNOSIS — E7800 Pure hypercholesterolemia, unspecified: Secondary | ICD-10-CM

## 2016-12-24 LAB — COMPREHENSIVE METABOLIC PANEL
ALBUMIN: 4.3 g/dL (ref 3.5–5.2)
ALT: 21 U/L (ref 0–35)
AST: 20 U/L (ref 0–37)
Alkaline Phosphatase: 82 U/L (ref 39–117)
BUN: 20 mg/dL (ref 6–23)
CALCIUM: 9.2 mg/dL (ref 8.4–10.5)
CHLORIDE: 108 meq/L (ref 96–112)
CO2: 29 mEq/L (ref 19–32)
Creatinine, Ser: 0.67 mg/dL (ref 0.40–1.20)
GFR: 89.44 mL/min (ref >60.00)
Glucose, Bld: 97 mg/dL (ref 70–99)
Potassium: 4.2 mEq/L (ref 3.5–5.1)
Sodium: 143 mEq/L (ref 135–145)
Total Bilirubin: 0.7 mg/dL (ref 0.2–1.2)
Total Protein: 6.9 g/dL (ref 6.0–8.3)

## 2016-12-24 LAB — CBC WITH DIFFERENTIAL/PLATELET
BASOS PCT: 0.8 % (ref 0.0–3.0)
Basophils Absolute: 0.1 10*3/uL (ref 0.0–0.1)
EOS ABS: 0.3 10*3/uL (ref 0.0–0.7)
EOS PCT: 3.8 % (ref 0.0–5.0)
HEMATOCRIT: 42 % (ref 36.0–46.0)
HEMOGLOBIN: 14.3 g/dL (ref 12.0–15.0)
LYMPHS PCT: 28.3 % (ref 12.0–46.0)
Lymphs Abs: 1.9 10*3/uL (ref 0.7–4.0)
MCHC: 34 g/dL (ref 30.0–36.0)
MCV: 89.8 fl (ref 78.0–100.0)
Monocytes Absolute: 0.6 10*3/uL (ref 0.1–1.0)
Monocytes Relative: 8.3 % (ref 3.0–12.0)
Neutro Abs: 4 10*3/uL (ref 1.4–7.7)
Neutrophils Relative %: 58.8 % (ref 43.0–77.0)
Platelets: 195 10*3/uL (ref 150.0–400.0)
RBC: 4.68 Mil/uL (ref 3.87–5.11)
RDW: 12.9 % (ref 11.5–15.5)
WBC: 6.7 10*3/uL (ref 4.0–10.5)

## 2016-12-24 LAB — TSH: TSH: 3.48 u[IU]/mL (ref 0.35–4.50)

## 2016-12-24 LAB — LIPID PANEL
CHOLESTEROL: 166 mg/dL (ref 0–200)
HDL: 78.6 mg/dL (ref >39.00)
LDL CALC: 73 mg/dL (ref 0–99)
NonHDL: 87.52
TRIGLYCERIDES: 75 mg/dL (ref 0.0–149.0)
Total CHOL/HDL Ratio: 2
VLDL: 15 mg/dL (ref 0.0–40.0)

## 2016-12-24 NOTE — Progress Notes (Signed)
Subjective:    Patient ID: Mary Summers, female    DOB: 09-Nov-1932, 83 y.o.   MRN: [id number]  HPI  83 year old patient who is seen today for a preventive health examination and annual wellness visit. She has done quite well.  She has been followed by pulmonary medicine due to interstitial lung disease probably secondary to sarcoidosis.  This has been very stable and she really denies much in the way of DOE.  She has a history of osteoarthritis and is status post right total hip replacement surgery.  She has had recent bilateral cataract extraction surgery.  She has history of mild carotid artery disease  Past Medical History:  Diagnosis Date  . Arthritis   . Hyperlipidemia   . Peripheral vascular disease ([place])    MILD CAROTID DISEASE BY DOPPLER STUDY  11/13/12. -REPORT FROM [facility]  . Seasonal allergies      Social History   Social History  . Marital status: Married    Spouse name: N/A  . Number of children: 0  . Years of education: N/A   Occupational History  . retired    Social History Main Topics  . Smoking status: Never Smoker  . Smokeless tobacco: Never Used  . Alcohol use 0.0 oz/week     Comment: OCCASIONAL ALCOHOL  . Drug use: No  . Sexual activity: Not on file   Other Topics Concern  . Not on file   Social History Narrative   Originally from [place], [place]. She lived for 14 years in [place]. She does live in the summer in [place]. Previously lived in [place]. Has previous travel to [place], [place], & [place]. 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 She reports he did work with some rare exotic woods from [place] & [place]. She does report chemical exposure to the spray booth in his shop. No bird exposure. She does have a hot tub but uses it irregularly.     Past Surgical History:  Procedure Laterality Date  . BREAST SURGERY     BREAST BIOPSIES X 2  - NO  CANCER  . congenitally absent uterus    . DOPPLER ECHOCARDIOGRAPHY    . LASER VOCAL CORD FOR NODULE  1985    . TOTAL HIP ARTHROPLASTY  11/29/2012   Procedure: TOTAL HIP ARTHROPLASTY ANTERIOR APPROACH;  Surgeon: Gearlean Alf, MD;  Location: [facility];  Service: Orthopedics;  Laterality: Left;    Family History  Problem Relation Age of Onset  . Arthritis Mother   . Heart failure Father   . Arthritis Sister   . Lung disease Neg Hx   . Rheumatologic disease Neg Hx     No Known Allergies  Current Outpatient Prescriptions on File Prior to Visit  Medication Sig Dispense Refill  . Ascorbic Acid (VITAMIN C PO) Take 1 tablet by mouth daily.    Marland Kitchen aspirin 81 MG tablet Take 81 mg by mouth daily.    Marland Kitchen atorvastatin (LIPITOR) 40 MG tablet TAKE 1 TABLET EVERY DAY 90 tablet 3  . beta carotene 30 MG capsule Take 30 mg by mouth daily.    . calcium gluconate 500 MG tablet Take 2 tablets by mouth daily.    . ciclopirox (PENLAC) 8 % solution Apply topically at bedtime. Apply over nail and surrounding skin. Apply daily over previous coat. After seven (7) days, may remove with alcohol and continue cycle. 6.6 mL 0  .  co-enzyme Q-10 30 MG capsule Take 30 mg by mouth daily.    . fluticasone (FLONASE) 50 MCG/ACT nasal spray Place 1 spray into both nostrils daily as needed for allergies or rhinitis. ONCE A DAY IF NEEDED 16 g 2  . glucosamine-chondroitin 500-400 MG tablet Take 2 tablets by mouth daily.    . Multiple Vitamin (MULTIVITAMIN) tablet Take 1 tablet by mouth daily.     . Omega-3 Fatty Acids (OMEGA 3 PO) Take 1 capsule by mouth daily.    . SELENIUM PO Take 1 tablet by mouth daily.    . Vitamin D, Cholecalciferol, 1000 units CAPS Take 1 tablet by mouth daily.    . vitamin E 400 UNIT capsule Take 400 Units by mouth daily.     No current facility-administered medications on file prior to visit.     BP 138/72 (BP Location: Right Arm, Patient Position: Sitting, Cuff Size: Normal)   Pulse 88   Temp 98.4  F (36.9 C) (Oral)   Ht 5\' 7"  (1.702 m)   Wt 194 lb 8 oz (88.2 kg)   SpO2 99%   BMI 30.46 kg/m    Annual Medicare wellness  1. Risk factors, based on past  M,S,F history.  Cardiovascular risk factors include a history of dyslipidemia.  She has mild carotid artery disease  2.  Physical activities:no exercise restriction.  Does have interstitial lung disease but this seems stable.  She does exercise.  Periodically on an exercise bike  3.  Depression/mood:no history of major depression or mood disorder  4.  Hearing:no major deficits.  Does have chronic mild tinnitus  5.  ADL's:independent- independent  6.  Fall risk:low  7.  Home safety:no problems identified  8.  Height weight, and visual acuity;height and weight stable no change in visual acuity.  Is please with recent cataract extraction surgery December 2017  9.  Counseling:continue heart healthy diet more rigorous exercise activities.  Encouraged  10. Lab orders based on risk factors:we'll check laboratory update including lipid profile 11. Referral :follow-up pulmonary medicine  12. Care plan:continue efforts at aggressive risk factor modification  13. Cognitive assessment: alert and oriented with normal affect no cognitive dysfunction  14. Screening: Patient provided with a written and personalized 5-10 year screening schedule in the AVS.    15. Provider List Update: primary care radiology and pulmonary medicine    Review of Systems  Constitutional: Negative.   HENT: Positive for congestion and tinnitus. Negative for dental problem, hearing loss, rhinorrhea, sinus pressure and sore throat.   Eyes: Negative for pain, discharge and visual disturbance.  Respiratory: Negative for cough and shortness of breath.   Cardiovascular: Negative for chest pain, palpitations and leg swelling.  Gastrointestinal: Negative for abdominal distention, abdominal pain, blood in stool, constipation, diarrhea, nausea and vomiting.    Genitourinary: Negative for difficulty urinating, dysuria, flank pain, frequency, hematuria, pelvic pain, urgency, vaginal bleeding, vaginal discharge and vaginal pain.  Musculoskeletal: Negative for arthralgias, gait problem and joint swelling.  Skin: Negative for rash.  Neurological: Negative for dizziness, syncope, speech difficulty, weakness, numbness and headaches.  Hematological: Negative for adenopathy.  Psychiatric/Behavioral: Negative for agitation, behavioral problems and dysphoric mood. The patient is not nervous/anxious.        Objective:   Physical Exam  Constitutional: She is oriented to person, place, and time. She appears well-developed and well-nourished.  HENT:  Head: Normocephalic and atraumatic.  Right Ear: External ear normal.  Left Ear: External ear normal.  Mouth/Throat: Oropharynx is  clear and moist.  Eyes: Conjunctivae and EOM are normal.  Neck: Normal range of motion. Neck supple. No JVD present. No thyromegaly present.  Cardiovascular: Normal rate, regular rhythm, normal heart sounds and intact distal pulses.   No murmur heard. Pulmonary/Chest: Effort normal and breath sounds normal. She has no wheezes. She has no rales.  Chest essentially clear.  Perhaps a few crackles at both bases  Abdominal: Soft. Bowel sounds are normal. She exhibits no distension and no mass. There is no tenderness. There is no rebound and no guarding.  Genitourinary: Vagina normal.  Musculoskeletal: Normal range of motion. She exhibits no edema or tenderness.  Neurological: She is alert and oriented to person, place, and time. She has normal reflexes. No cranial nerve deficit. She exhibits normal muscle tone. Coordination normal.  Skin: Skin is warm and dry. No rash noted.  Psychiatric: She has a normal mood and affect. Her behavior is normal.          Assessment & Plan:   Preventive health examination Medicare wellness visit Dyslipidemia.  Check lipid profile ILD.  Probably  secondary to sarcoidosis.  Follow-up pulmonary medicine.  Clinically stable at this time Osteoarthritis status post right total hip replacement surgery  Follow-up one year or as needed Mammogram the spring  Nyoka Cowden

## 2016-12-24 NOTE — Patient Instructions (Addendum)
Limit your sodium (Salt) intake    It is important that you exercise regularly, at least 20 minutes 3 to 4 times per week.  If you develop chest pain or shortness of breath seek  medical attention.  Take a calcium supplement, plus [id number] units of vitamin D  Return in one year for follow-up  Follow-up pulmonary medicine as scheduled  Schedule your mammogram.

## 2016-12-24 NOTE — Progress Notes (Signed)
Pre visit review using our clinic review tool, if applicable. No additional management support is needed unless otherwise documented below in the visit note. 

## 2017-03-09 ENCOUNTER — Ambulatory Visit (INDEPENDENT_AMBULATORY_CARE_PROVIDER_SITE_OTHER): Payer: Medicare Other | Admitting: Family Medicine

## 2017-03-09 ENCOUNTER — Encounter: Payer: Self-pay | Admitting: Family Medicine

## 2017-03-09 VITALS — BP 130/90 | HR 102 | Temp 98.4°F | Wt 190.9 lb

## 2017-03-09 DIAGNOSIS — R112 Nausea with vomiting, unspecified: Secondary | ICD-10-CM

## 2017-03-09 DIAGNOSIS — R1032 Left lower quadrant pain: Secondary | ICD-10-CM

## 2017-03-09 DIAGNOSIS — R1031 Right lower quadrant pain: Secondary | ICD-10-CM | POA: Diagnosis not present

## 2017-03-09 LAB — COMPREHENSIVE METABOLIC PANEL
ALT: 13 U/L (ref 0–35)
AST: 19 U/L (ref 0–37)
Albumin: 4.2 g/dL (ref 3.5–5.2)
Alkaline Phosphatase: 89 U/L (ref 39–117)
BILIRUBIN TOTAL: 1.2 mg/dL (ref 0.2–1.2)
BUN: 26 mg/dL — ABNORMAL HIGH (ref 6–23)
CHLORIDE: 96 meq/L (ref 96–112)
CO2: 33 meq/L — AB (ref 19–32)
CREATININE: 0.8 mg/dL (ref 0.40–1.20)
Calcium: 9.9 mg/dL (ref 8.4–10.5)
GFR: 72.85 mL/min (ref >60.00)
GLUCOSE: 121 mg/dL — AB (ref 70–99)
Potassium: 4.3 mEq/L (ref 3.5–5.1)
Sodium: 134 mEq/L — ABNORMAL LOW (ref 135–145)
Total Protein: 7.1 g/dL (ref 6.0–8.3)

## 2017-03-09 LAB — CBC WITH DIFFERENTIAL/PLATELET
Basophils Absolute: 0 10*3/uL (ref 0.0–0.1)
Basophils Relative: 0.3 % (ref 0.0–3.0)
EOS ABS: 0.1 10*3/uL (ref 0.0–0.7)
Eosinophils Relative: 1 % (ref 0.0–5.0)
HCT: 46.5 % — ABNORMAL HIGH (ref 36.0–46.0)
Hemoglobin: 15.6 g/dL — ABNORMAL HIGH (ref 12.0–15.0)
LYMPHS ABS: 1.4 10*3/uL (ref 0.7–4.0)
Lymphocytes Relative: 12.4 % (ref 12.0–46.0)
MCHC: 33.6 g/dL (ref 30.0–36.0)
MCV: 88.5 fl (ref 78.0–100.0)
MONO ABS: 0.9 10*3/uL (ref 0.1–1.0)
Monocytes Relative: 7.6 % (ref 3.0–12.0)
NEUTROS ABS: 9.1 10*3/uL — AB (ref 1.4–7.7)
NEUTROS PCT: 78.7 % — AB (ref 43.0–77.0)
PLATELETS: 263 10*3/uL (ref 150.0–400.0)
RBC: 5.26 Mil/uL — ABNORMAL HIGH (ref 3.87–5.11)
RDW: 12.1 % (ref 11.5–15.5)
WBC: 11.6 10*3/uL — ABNORMAL HIGH (ref 4.0–10.5)

## 2017-03-09 LAB — POCT URINALYSIS DIPSTICK
Bilirubin, UA: NEGATIVE
Blood, UA: NEGATIVE
GLUCOSE UA: NEGATIVE
Nitrite, UA: NEGATIVE
PROTEIN UA: NEGATIVE
SPEC GRAV UA: 1.025 (ref 1.030–1.035)
UROBILINOGEN UA: 0.2 (ref ?–2.0)
pH, UA: 6 (ref 5.0–8.0)

## 2017-03-09 LAB — LIPASE: Lipase: 28 U/L (ref 11.0–59.0)

## 2017-03-09 MED ORDER — ONDANSETRON 8 MG PO TBDP: 8.0000 mg | ORAL_TABLET | Freq: Three times a day (TID) | ORAL | 0 refills | Status: DC | PRN

## 2017-03-09 MED ORDER — METRONIDAZOLE 500 MG PO TABS: 500.0000 mg | ORAL_TABLET | Freq: Three times a day (TID) | ORAL | 0 refills | Status: DC

## 2017-03-09 MED ORDER — CIPROFLOXACIN HCL 500 MG PO TABS: 500.0000 mg | ORAL_TABLET | Freq: Two times a day (BID) | ORAL | 0 refills | Status: DC

## 2017-03-09 NOTE — Patient Instructions (Signed)

## 2017-03-09 NOTE — Progress Notes (Signed)
Subjective:     Patient ID: Mary Summers, female   DOB: [birth year], 83 y.o.   MRN: [id number]  HPI Patient has history of reported interstitial lung disease, hyperlipidemia, osteoarthritis. She is seen as a work in today with onset Sunday night and early Monday morning of some bilateral lower abdominal pain and intermittent nausea and vomiting. She's had no diarrhea. She has actually not had any stool past few days. Occasional cough. She said she's had about 3 episodes of vomiting per day. Nonbloody. Lower abdominal pain is described as achiness. Mild to moderate intensity. No dysuria. No fevers or chills. Some diminished appetite but keeping down some fluids and foods. No recent melena. Denies any upper abdominal pain.  Patient had colonoscopy back in 2010 with comment of diverticulosis involving the entire colon but especially left-sided. Patient states she had some type of flare with "colitis "back in [place] when she was in her 32s. She states diagnosis was somewhat in question. She denies any history of acute diverticulitis. No upper abdominal pain.  Past Medical History:  Diagnosis Date  . Arthritis   . Hyperlipidemia   . Peripheral vascular disease ([place])    MILD CAROTID DISEASE BY DOPPLER STUDY  11/13/12. -REPORT FROM [facility]  . Seasonal allergies    Past Surgical History:  Procedure Laterality Date  . BREAST SURGERY     BREAST BIOPSIES X 2  - NO CANCER  . congenitally absent uterus    . DOPPLER ECHOCARDIOGRAPHY    . LASER VOCAL CORD FOR NODULE  1985    . TOTAL HIP ARTHROPLASTY  11/29/2012   Procedure: TOTAL HIP ARTHROPLASTY ANTERIOR APPROACH;  Surgeon: Gearlean Alf, MD;  Location: [facility];  Service: Orthopedics;  Laterality: Left;    reports that she has never smoked. She has never used smokeless tobacco. She reports that she drinks alcohol. She reports that she does not use drugs. family history includes Arthritis in her mother and sister;  Heart failure in her father. No Known Allergies   Review of Systems  Constitutional: Negative for chills and fever.  Respiratory: Negative for shortness of breath.   Cardiovascular: Negative for chest pain, palpitations and leg swelling.  Gastrointestinal: Positive for abdominal pain, nausea and vomiting. Negative for abdominal distention, blood in stool and diarrhea.  Genitourinary: Negative for dysuria, flank pain, frequency and hematuria.  Neurological: Negative for dizziness and weakness.  Psychiatric/Behavioral: Negative for confusion.       Objective:   Physical Exam  Constitutional: She appears well-developed and well-nourished.  Cardiovascular: Normal rate and regular rhythm.   Pulmonary/Chest: Effort normal and breath sounds normal. No respiratory distress. She has no wheezes. She has no rales.  Abdominal: Soft. Bowel sounds are normal. She exhibits no distension and no mass. There is tenderness. There is no rebound and no guarding.  Mild tenderness right and left lower quadrants to deep palpation. No guarding or rebound. Normal bowel sounds       Assessment:     Bilateral lower abdominal pain associated with nausea and vomiting but no diarrhea. She does not have any upper abdominal pain to suggest likely pancreatitis, peptic ulcer disease, or gallstone disease. She has known history of severe diverticulosis which raises possibility of acute diverticulitis. Nonacute abdomen    Plan:     -Check labs with CBC with differential, comprehensive metabolic panel, lipase, urinalysis -Zofran 8 mg every 8 hours as needed for nausea and vomiting -Cipro 500 mg twice a day  for 10 days and metronidazole 500 mg 3 times a day for 10 days -Follow-up immediately for any fever, recurrent vomiting, worsening abdominal pain, or other concerns -Consider CT abdomen and pelvis for any worsening symptoms  Eulas Post MD [facility]

## 2017-03-09 NOTE — Progress Notes (Signed)
Pre visit review using our clinic review tool, if applicable. No additional management support is needed unless otherwise documented below in the visit note. 

## 2017-03-10 ENCOUNTER — Telehealth: Payer: Self-pay

## 2017-03-10 NOTE — Telephone Encounter (Signed)
PA approved, form faxed back to pharmacy. 

## 2017-03-10 NOTE — Telephone Encounter (Signed)
Received PA request from [facility] for Ondansetron. PA submitted & is pending. Key: [id number]

## 2017-03-16 ENCOUNTER — Other Ambulatory Visit: Payer: Self-pay | Admitting: Internal Medicine

## 2017-03-18 ENCOUNTER — Ambulatory Visit (INDEPENDENT_AMBULATORY_CARE_PROVIDER_SITE_OTHER): Payer: Medicare Other | Admitting: Internal Medicine

## 2017-03-18 ENCOUNTER — Encounter: Payer: Self-pay | Admitting: Internal Medicine

## 2017-03-18 VITALS — BP 106/70 | HR 86 | Ht 67.0 in | Wt 187.0 lb

## 2017-03-18 DIAGNOSIS — I739 Peripheral vascular disease, unspecified: Secondary | ICD-10-CM

## 2017-03-18 DIAGNOSIS — I6523 Occlusion and stenosis of bilateral carotid arteries: Secondary | ICD-10-CM | POA: Diagnosis not present

## 2017-03-18 DIAGNOSIS — E7800 Pure hypercholesterolemia, unspecified: Secondary | ICD-10-CM | POA: Diagnosis not present

## 2017-03-18 NOTE — Progress Notes (Signed)
OFFICE NOTE  Chief Complaint:  URI, recent diverticulitis  Primary Care Physician: Nyoka Cowden, MD  HPI:  Mary Summers  is a 83 year old female who has done generally very well. Her husband also is a patient of mine. Actually, last year she had undergone a [employer] mobile screening test which showed mild carotid artery disease and low Doppler velocities bilaterally in the carotids. She also has some increased risk for osteoporosis and dyslipidemia with an elevated total cholesterol of 212, LDL of 136 at the time. She was started on low dose pravastatin and a comprehensive lipid profile was obtained including NMR. Her NMR demonstrated actually high particle number relative to her LDL cholesterol 1736 with a calculated LDL of 148, HDL of 64 and high small LDL particle number 536. Overall, an atherogenic lipid profile. I recommended then changing to a moderate to high intensity statin, specifically atorvastatin 40 mg daily. She did make that switch, seems to be tolerating the atorvastatin without any significant myalgias or any other problems with medications. In December, she underwent left hip replacement by Dr. Wynelle Link and is doing very well with increased activity and mobility due to that. Overall not complaining of any cardiac symptoms including shortness of breath, palpitations, chest pain, presyncope or other syncopal symptoms.   No new events are noted since I last saw her. She continues to take her cholesterol medicine is due for recheck to her primary care provider in a couple of weeks. She does report some nocturnal productive cough and was told before that she may have slight overinflation of her lungs. I wonder she has some element of chronic bronchitis or asthmatic bronchitis.  Mary Summers returns today for follow-up with her husband. She reports that she's had some progressive shortness of breath which has been worsening over the past year. Particularly her symptoms are  at night and she notes increased mucus production. As we discussed previously she does have a history of COPD. This was recently presented to her primary care provider who got an x-ray of her last week. The chest x-ray demonstrates progressive interstitial lung disease as well as COPD. She also was noted to have onychomycosis of her toenails. It was recommended that she go on to Lamisil, but that she discontinue her Lipitor while on treatment with that medication.   03/18/2017  Mary Summers was seen today in follow-up. She recently had similar infection that her husband had and ultimately developed some diverticulitis. She is currently on ciprofloxacin and metronidazole. She said her symptoms are improving. In the past she has had evaluation of prominent findings on her chest x-ray consistent with COPD or possible interstitial lung disease. She also had a high-resolution CT which suggested some interstitial process however a clear diagnosis was not made. She's not currently on any treatment. She has been on statin therapy and total cholesterol is 166, HDL 78, LDL-C 73, triglycerides 75. As with her husband she does have bilateral carotid artery disease as well.  PMHx:  Past Medical History:  Diagnosis Date  . Arthritis   . Hyperlipidemia   . Peripheral vascular disease ([place])    MILD CAROTID DISEASE BY DOPPLER STUDY  11/13/12. -REPORT FROM [facility]  . Seasonal allergies     Past Surgical History:  Procedure Laterality Date  . BREAST SURGERY     BREAST BIOPSIES X 2  - NO CANCER  . congenitally absent uterus    . DOPPLER ECHOCARDIOGRAPHY    . LASER  VOCAL CORD FOR NODULE  1985    . TOTAL HIP ARTHROPLASTY  11/29/2012   Procedure: TOTAL HIP ARTHROPLASTY ANTERIOR APPROACH;  Surgeon: Gearlean Alf, MD;  Location: [facility];  Service: Orthopedics;  Laterality: Left;    FAMHx:  Family History  Problem Relation Age of Onset  . Arthritis Mother   . Heart failure  Father   . Arthritis Sister   . Lung disease Neg Hx   . Rheumatologic disease Neg Hx     SOCHx:   reports that she has never smoked. She has never used smokeless tobacco. She reports that she drinks alcohol. She reports that she does not use drugs.  ALLERGIES:  No Known Allergies  ROS: Pertinent items noted in HPI and remainder of comprehensive ROS otherwise negative.  HOME MEDS: Current Outpatient Prescriptions  Medication Sig Dispense Refill  . Ascorbic Acid (VITAMIN C PO) Take 1 tablet by mouth daily.    Marland Kitchen aspirin 81 MG tablet Take 81 mg by mouth daily.    Marland Kitchen atorvastatin (LIPITOR) 40 MG tablet TAKE 1 TABLET EVERY DAY 30 tablet 0  . beta carotene 30 MG capsule Take 30 mg by mouth daily.    . calcium gluconate 500 MG tablet Take 2 tablets by mouth daily.    . ciclopirox (PENLAC) 8 % solution Apply topically at bedtime. Apply over nail and surrounding skin. Apply daily over previous coat. After seven (7) days, may remove with alcohol and continue cycle. 6.6 mL 0  . ciprofloxacin (CIPRO) 500 MG tablet Take 1 tablet (500 mg total) by mouth 2 (two) times daily. 20 tablet 0  . co-enzyme Q-10 30 MG capsule Take 30 mg by mouth daily.    . fluticasone (FLONASE) 50 MCG/ACT nasal spray Place 1 spray into both nostrils daily as needed for allergies or rhinitis. ONCE A DAY IF NEEDED 16 g 2  . glucosamine-chondroitin 500-400 MG tablet Take 2 tablets by mouth daily.    . metroNIDAZOLE (FLAGYL) 500 MG tablet Take 1 tablet (500 mg total) by mouth 3 (three) times daily. 30 tablet 0  . Multiple Vitamin (MULTIVITAMIN) tablet Take 1 tablet by mouth daily.     . Omega-3 Fatty Acids (OMEGA 3 PO) Take 1 capsule by mouth daily.    . ondansetron (ZOFRAN ODT) 8 MG disintegrating tablet Take 1 tablet (8 mg total) by mouth every 8 (eight) hours as needed for nausea or vomiting. 12 tablet 0  . SELENIUM PO Take 1 tablet by mouth daily.    . Vitamin D, Cholecalciferol, 1000 units CAPS Take 1 tablet by mouth  daily.    . vitamin E 400 UNIT capsule Take 400 Units by mouth daily.     No current facility-administered medications for this visit.     LABS/IMAGING: No results found for this or any previous visit (from the past 48 hour(s)). No results found.  VITALS: BP 106/70   Pulse 86   Ht 5\' 7"  (1.702 m)   Wt 187 lb (84.8 kg)   BMI 29.29 kg/m   EXAM: General appearance: alert and no distress Neck: no carotid bruit, no JVD and thyroid not enlarged, symmetric, no tenderness/mass/nodules Lungs: diminished breath sounds bilaterally Heart: regular rate and rhythm Abdomen: soft, non-tender; bowel sounds normal; no masses,  no organomegaly Extremities: extremities normal, atraumatic, no cyanosis or edema Pulses: 2+ and symmetric Skin: Skin color, texture, turgor normal. No rashes or lesions Neurologic: Grossly normal  EKG: Sinus rhythm 86  ASSESSMENT: 1. CT and x-ray  findings of COPD/interstitial lung disease 2. Dyslipidemia 3. Mild bilateral carotid artery disease 4. Productive nocturnal cough - likely related to COPD 5. Onychomycosis  PLAN: 1.   Mrs. Poulos symptomatically has done well from a cardiac standpoint. Unfortunate she's struggling with diverticulitis flare which is improving on antibiotics. She does have mild bilateral carotid artery disease which is due for reassessment. We'll check carotid Dopplers today. Cholesterol presented be well-controlled on Lipitor with LDL of 73. Plan follow-up annually or sooner as necessary.  Pixie Casino, MD, [facility] Attending Cardiologist Kinney C Hilty 03/18/2017, 1:25 PM

## 2017-03-18 NOTE — Patient Instructions (Signed)
Medication Instructions: Your physician recommends that you continue on your current medications as directed. Please refer to the Current Medication list given to you today.   Procedures/Testing: Your physician has requested that you have a carotid duplex. This test is an ultrasound of the carotid arteries in your neck. It looks at blood flow through these arteries that supply the brain with blood. Allow one hour for this exam. There are no restrictions or special instructions.  Follow-Up: Your physician wants you to follow-up in: 12 months with Dr. Theresa Duty will receive a reminder letter in the mail two months in advance. If you don't receive a letter, please call our office to schedule the follow-up appointment.    If you need a refill on your cardiac medications before your next appointment, please call your pharmacy.

## 2017-04-12 ENCOUNTER — Ambulatory Visit (HOSPITAL_COMMUNITY)
Admission: RE | Admit: 2017-04-12 | Discharge: 2017-04-12 | Disposition: A | Discharge status: Home / Self Care | Payer: Medicare Other | Source: Ambulatory Visit | Attending: Cardiovascular Disease | Admitting: Cardiovascular Disease

## 2017-04-12 ENCOUNTER — Other Ambulatory Visit: Payer: Self-pay | Admitting: Internal Medicine

## 2017-04-12 DIAGNOSIS — I6523 Occlusion and stenosis of bilateral carotid arteries: Secondary | ICD-10-CM

## 2017-04-12 DIAGNOSIS — E785 Hyperlipidemia, unspecified: Secondary | ICD-10-CM | POA: Insufficient documentation

## 2017-04-12 DIAGNOSIS — I739 Peripheral vascular disease, unspecified: Secondary | ICD-10-CM | POA: Diagnosis not present

## 2017-04-12 DIAGNOSIS — J449 Chronic obstructive pulmonary disease, unspecified: Secondary | ICD-10-CM | POA: Insufficient documentation

## 2017-04-22 ENCOUNTER — Telehealth: Payer: Self-pay | Admitting: Internal Medicine

## 2017-04-22 MED ORDER — ATORVASTATIN CALCIUM 40 MG PO TABS: 40.0000 mg | ORAL_TABLET | Freq: Every day | ORAL | 11 refills | Status: DC

## 2017-04-22 NOTE — Telephone Encounter (Signed)
New Message      *STAT* If patient is at the pharmacy, call can be transferred to refill team.   1. Which medications need to be refilled? (please list name of each medication and dose if known)  atorvastatin (LIPITOR) 40 MG tablet TAKE 1 TABLET EVERY DAY     2. Which pharmacy/location (including street and city if local pharmacy) is [facility] [address]   3. Do they need a 30 day or 90 day supply?  60 days they are leaving for vacation

## 2017-04-22 NOTE — Telephone Encounter (Signed)
Refill sent to the pharmacy electronically.  

## 2017-09-07 ENCOUNTER — Encounter: Payer: Self-pay | Admitting: Family Medicine

## 2017-09-07 ENCOUNTER — Ambulatory Visit (INDEPENDENT_AMBULATORY_CARE_PROVIDER_SITE_OTHER): Payer: Medicare Other | Admitting: Family Medicine

## 2017-09-07 VITALS — BP 130/80 | HR 107 | Temp 98.5°F | Wt 187.7 lb

## 2017-09-07 DIAGNOSIS — M25461 Effusion, right knee: Secondary | ICD-10-CM | POA: Diagnosis not present

## 2017-09-07 NOTE — Progress Notes (Signed)
Subjective:     Patient ID: Mary Summers, female   DOB: 1932/09/30, 83 y.o.   MRN: [id number]  HPI Patient seen with right knee pain and effusion. She travels every summer and was in [place] last summer (actually in May) and noticed some transient knee pain but no swelling. Knee pain seemed to improve over the summer then about 5 days ago she noticed some pain with flexing the knee along with some swelling. Minimal pain with ambulation. Does not recall any fall or injury. No warmth or erythema. She took 2 Aleve earlier today which has helped. She's not tried any icing. Denies any history of gout or pseudogout. No prior knee difficulties. No history of knee x-rays.  Past Medical History:  Diagnosis Date  . Arthritis   . Hyperlipidemia   . Peripheral vascular disease ([place])    MILD CAROTID DISEASE BY DOPPLER STUDY  11/13/12. -REPORT FROM [facility]  . Seasonal allergies    Past Surgical History:  Procedure Laterality Date  . BREAST SURGERY     BREAST BIOPSIES X 2  - NO CANCER  . congenitally absent uterus    . DOPPLER ECHOCARDIOGRAPHY    . LASER VOCAL CORD FOR NODULE  1985    . TOTAL HIP ARTHROPLASTY  11/29/2012   Procedure: TOTAL HIP ARTHROPLASTY ANTERIOR APPROACH;  Surgeon: Gearlean Alf, MD;  Location: [facility];  Service: Orthopedics;  Laterality: Left;    reports that she has never smoked. She has never used smokeless tobacco. She reports that she drinks alcohol. She reports that she does not use drugs. family history includes Arthritis in her mother and sister; Heart failure in her father. No Known Allergies   Review of Systems  Neurological: Negative for weakness and numbness.       Objective:   Physical Exam  Constitutional: She appears well-developed and well-nourished.  Cardiovascular: Normal rate and regular rhythm.   Musculoskeletal:  Right knee reveals small to moderate size effusion. No ecchymosis. No erythema. Minimal if any  warmth. No localized tenderness. Ligament testing is normal.       Assessment:     Right knee effusion. 5 day duration. No reported injury. Differential would include pseudogout versus meniscal injury versus other.    Plan:     -Recommend x-ray of right knee for further evaluation -Try icing 15-20 minutes 3 times daily and elevation -Cautious use of Aleve as needed for inflammation -Consider sports medicine referral if symptoms persist.  Eulas Post MD [facility]

## 2017-09-07 NOTE — Patient Instructions (Signed)
Try Flonase or Nasacort  Recommend icing knee 15-20 minutes 2-3 times daily May continue with Aleve one twice daily Go for X-ray, as discussed.

## 2017-09-08 ENCOUNTER — Ambulatory Visit (INDEPENDENT_AMBULATORY_CARE_PROVIDER_SITE_OTHER)
Admission: RE | Admit: 2017-09-08 | Discharge: 2017-09-08 | Disposition: A | Discharge status: Home / Self Care | Payer: Medicare Other | Source: Ambulatory Visit | Attending: Family Medicine | Admitting: Family Medicine

## 2017-09-08 ENCOUNTER — Encounter: Payer: Self-pay | Admitting: Internal Medicine

## 2017-09-08 DIAGNOSIS — M25461 Effusion, right knee: Secondary | ICD-10-CM

## 2017-12-27 ENCOUNTER — Encounter: Payer: Self-pay | Admitting: Internal Medicine

## 2017-12-27 ENCOUNTER — Ambulatory Visit (INDEPENDENT_AMBULATORY_CARE_PROVIDER_SITE_OTHER): Payer: Medicare Other | Admitting: Internal Medicine

## 2017-12-27 VITALS — BP 122/76 | HR 96 | Temp 98.6°F | Ht 67.0 in | Wt 195.0 lb

## 2017-12-27 DIAGNOSIS — J849 Interstitial pulmonary disease, unspecified: Secondary | ICD-10-CM | POA: Diagnosis not present

## 2017-12-27 DIAGNOSIS — I739 Peripheral vascular disease, unspecified: Secondary | ICD-10-CM

## 2017-12-27 DIAGNOSIS — Z0000 Encounter for general adult medical examination without abnormal findings: Secondary | ICD-10-CM | POA: Diagnosis not present

## 2017-12-27 DIAGNOSIS — E7800 Pure hypercholesterolemia, unspecified: Secondary | ICD-10-CM | POA: Diagnosis not present

## 2017-12-27 LAB — CBC WITH DIFFERENTIAL/PLATELET
BASOS ABS: 0.1 10*3/uL (ref 0.0–0.1)
Basophils Relative: 1.2 % (ref 0.0–3.0)
EOS PCT: 3.3 % (ref 0.0–5.0)
Eosinophils Absolute: 0.2 10*3/uL (ref 0.0–0.7)
HCT: 43.3 % (ref 36.0–46.0)
Hemoglobin: 14.2 g/dL (ref 12.0–15.0)
LYMPHS ABS: 1.2 10*3/uL (ref 0.7–4.0)
Lymphocytes Relative: 23.5 % (ref 12.0–46.0)
MCHC: 32.8 g/dL (ref 30.0–36.0)
MCV: 91.5 fl (ref 78.0–100.0)
MONO ABS: 0.4 10*3/uL (ref 0.1–1.0)
Monocytes Relative: 8.4 % (ref 3.0–12.0)
NEUTROS ABS: 3.4 10*3/uL (ref 1.4–7.7)
NEUTROS PCT: 63.6 % (ref 43.0–77.0)
PLATELETS: 184 10*3/uL (ref 150.0–400.0)
RBC: 4.73 Mil/uL (ref 3.87–5.11)
RDW: 12.6 % (ref 11.5–15.5)
WBC: 5.3 10*3/uL (ref 4.0–10.5)

## 2017-12-27 LAB — COMPREHENSIVE METABOLIC PANEL
ALK PHOS: 78 U/L (ref 39–117)
ALT: 17 U/L (ref 0–35)
AST: 18 U/L (ref 0–37)
Albumin: 4.1 g/dL (ref 3.5–5.2)
BILIRUBIN TOTAL: 0.7 mg/dL (ref 0.2–1.2)
BUN: 13 mg/dL (ref 6–23)
CO2: 32 mEq/L (ref 19–32)
Calcium: 9.3 mg/dL (ref 8.4–10.5)
Chloride: 104 mEq/L (ref 96–112)
Creatinine, Ser: 0.58 mg/dL (ref 0.40–1.20)
GFR: 105.38 mL/min (ref >60.00)
GLUCOSE: 96 mg/dL (ref 70–99)
Potassium: 4.4 mEq/L (ref 3.5–5.1)
SODIUM: 141 meq/L (ref 135–145)
TOTAL PROTEIN: 6.6 g/dL (ref 6.0–8.3)

## 2017-12-27 LAB — LIPID PANEL
Cholesterol: 141 mg/dL (ref 0–200)
HDL: 57 mg/dL (ref >39.00)
LDL Cholesterol: 63 mg/dL (ref 0–99)
NONHDL: 84.24
Total CHOL/HDL Ratio: 2
Triglycerides: 105 mg/dL (ref 0.0–149.0)
VLDL: 21 mg/dL (ref 0.0–40.0)

## 2017-12-27 LAB — TSH: TSH: 3.01 u[IU]/mL (ref 0.35–4.50)

## 2017-12-27 NOTE — Patient Instructions (Signed)
Limit your sodium (Salt) intake    It is important that you exercise regularly, at least 20 minutes 3 to 4 times per week.  If you develop chest pain or shortness of breath seek  medical attention.  Pulmonary follow-up  Schedule your mammogram.

## 2017-12-27 NOTE — Progress Notes (Signed)
Subjective:    Patient ID: Mary Summers, female    DOB: October 25, 1932, 84 y.o.   MRN: [id number]  HPI  84 year old patient who is seen today for an annual exam and subsequent Medicare wellness visit. She continues to do well.  She has been followed by pulmonary medicine for interstitial lung disease thought secondary to sarcoidosis.  She has some mild chronic cough that seems more secondary to upper airway and rhinorrhea.  Denies much in the way of DOE.  She remains active. She has a history of dyslipidemia and remains on statin therapy. No new concerns or complaints  Past Medical History:  Diagnosis Date  . Arthritis   . Hyperlipidemia   . Peripheral vascular disease ([place])    MILD CAROTID DISEASE BY DOPPLER STUDY  11/13/12. -REPORT FROM [facility]  . Seasonal allergies      Social History   Socioeconomic History  . Marital status: Married    Spouse name: Not on file  . Number of children: 0  . Years of education: Not on file  . Highest education level: Not on file  Social Needs  . Financial resource strain: Not on file  . Food insecurity - worry: Not on file  . Food insecurity - inability: Not on file  . Transportation needs - medical: Not on file  . Transportation needs - non-medical: Not on file  Occupational History  . Occupation: retired  Tobacco Use  . Smoking status: Never Smoker  . Smokeless tobacco: Never Used  Substance and Sexual Activity  . Alcohol use: Yes    Alcohol/week: 0.0 oz    Comment: OCCASIONAL ALCOHOL  . Drug use: No  . Sexual activity: Not on file  Other Topics Concern  . Not on file  Social History Narrative   Originally from [place], [place]. She lived for 14 years in [place]. She does live in the summer in [place]. Previously lived in [place]. Has previous travel to [place], [place], & [place]. 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 She reports he  did work with some rare exotic woods from [place] & [place]. She does report chemical exposure to the spray booth in his shop. No bird exposure. She does have a hot tub but uses it irregularly.     Past Surgical History:  Procedure Laterality Date  . BREAST SURGERY     BREAST BIOPSIES X 2  - NO CANCER  . congenitally absent uterus    . DOPPLER ECHOCARDIOGRAPHY    . LASER VOCAL CORD FOR NODULE  1985    . TOTAL HIP ARTHROPLASTY  11/29/2012   Procedure: TOTAL HIP ARTHROPLASTY ANTERIOR APPROACH;  Surgeon: Gearlean Alf, MD;  Location: [facility];  Service: Orthopedics;  Laterality: Left;    Family History  Problem Relation Age of Onset  . Arthritis Mother   . Heart failure Father   . Arthritis Sister   . Lung disease Neg Hx   . Rheumatologic disease Neg Hx     No Known Allergies  Current Outpatient Medications on File Prior to Visit  Medication Sig Dispense Refill  . Ascorbic Acid (VITAMIN C PO) Take 1 tablet by mouth daily.    Marland Kitchen aspirin 81 MG tablet Take 81 mg by mouth daily.    Marland Kitchen atorvastatin (LIPITOR) 40 MG tablet Take 1 tablet (40 mg total) by mouth daily. 30 tablet 11  . beta carotene 30 MG capsule  Take 30 mg by mouth daily.    . calcium gluconate 500 MG tablet Take 2 tablets by mouth daily.    . ciclopirox (PENLAC) 8 % solution Apply topically at bedtime. Apply over nail and surrounding skin. Apply daily over previous coat. After seven (7) days, may remove with alcohol and continue cycle. 6.6 mL 0  . co-enzyme Q-10 30 MG capsule Take 30 mg by mouth daily.    . fluticasone (FLONASE) 50 MCG/ACT nasal spray Place 1 spray into both nostrils daily as needed for allergies or rhinitis. ONCE A DAY IF NEEDED 16 g 2  . glucosamine-chondroitin 500-400 MG tablet Take 2 tablets by mouth daily.    . Multiple Vitamin (MULTIVITAMIN) tablet Take 1 tablet by mouth daily.     . Omega-3 Fatty Acids (OMEGA 3 PO) Take 1 capsule by mouth daily.    . ondansetron (ZOFRAN ODT) 8 MG disintegrating tablet  Take 1 tablet (8 mg total) by mouth every 8 (eight) hours as needed for nausea or vomiting. 12 tablet 0  . SELENIUM PO Take 1 tablet by mouth daily.    . Vitamin D, Cholecalciferol, 1000 units CAPS Take 1 tablet by mouth daily.    . vitamin E 400 UNIT capsule Take 400 Units by mouth daily.     No current facility-administered medications on file prior to visit.     BP 122/76 (BP Location: Left Arm, Patient Position: Sitting, Cuff Size: Normal)   Pulse 96   Temp 98.6 F (37 C) (Oral)   Ht 5\' 7"  (1.702 m)   Wt 195 lb (88.5 kg)   SpO2 96%   BMI 30.54 kg/m   Subsequent Medicare wellness visit  1. Risk factors, based on past  M,S,F history she does have a history of carotid artery disease and is followed with annual carotid artery Doppler studies. .  Cardiovascular risk factors include dyslipidemia. 2.  Physical activities: Fairly active and enjoys travel.  No significant DOE  3.  Depression/mood:  No history of major depression or mood disorder 4.  Hearing:  5.  ADL's: No deficits  6.  Fall risk: Average for age.  No falls over the past year  7.  Home safety: No problems identified  8.  Height weight, and visual acuity; height and weight stable no change in visual acuity.  Has had cataract extraction surgery  9.  Counseling: Annual mammogram encouraged  10. Lab orders based on risk factors: Laboratory update including lipid profile will be reviewed  11. Referral : Follow-up pulmonary medicine  12. Care plan: Continue statin therapy and efforts at aggressive risk factor modification  13. Cognitive assessment: Alert and oriented with normal affect.  No cognitive dysfunction  14. Screening: Patient provided with a written and personalized 5-10 year screening schedule in the AVS.    15. Provider List Update: Primary care orthopedics and pulmonary medicine    Review of Systems  Constitutional: Negative.   HENT: Positive for postnasal drip and rhinorrhea. Negative for  congestion, dental problem, hearing loss, sinus pressure, sore throat and tinnitus.   Eyes: Negative for pain, discharge and visual disturbance.  Respiratory: Positive for cough. Negative for shortness of breath.   Cardiovascular: Negative for chest pain, palpitations and leg swelling.  Gastrointestinal: Negative for abdominal distention, abdominal pain, blood in stool, constipation, diarrhea, nausea and vomiting.  Genitourinary: Negative for difficulty urinating, dysuria, flank pain, frequency, hematuria, pelvic pain, urgency, vaginal bleeding, vaginal discharge and vaginal pain.  Musculoskeletal: Negative for arthralgias, gait  problem and joint swelling.  Skin: Negative for rash.  Neurological: Negative for dizziness, syncope, speech difficulty, weakness, numbness and headaches.  Hematological: Negative for adenopathy.  Psychiatric/Behavioral: Negative for agitation, behavioral problems and dysphoric mood. The patient is not nervous/anxious.        Objective:   Physical Exam  Constitutional: She is oriented to person, place, and time. She appears well-developed and well-nourished.  HENT:  Head: Normocephalic and atraumatic.  Right Ear: External ear normal.  Left Ear: External ear normal.  Mouth/Throat: Oropharynx is clear and moist.  Eyes: Conjunctivae and EOM are normal.  Neck: Normal range of motion. Neck supple. No JVD present. No thyromegaly present.  Cardiovascular: Normal rate, regular rhythm, normal heart sounds and intact distal pulses.  No murmur heard. Pulmonary/Chest: Effort normal and breath sounds normal. She has no wheezes. She has no rales.  Chest is clear without pulmonary rales  Abdominal: Soft. Bowel sounds are normal. She exhibits no distension and no mass. There is no tenderness. There is no rebound and no guarding.  Musculoskeletal: Normal range of motion. She exhibits no edema or tenderness.  Neurological: She is alert and oriented to person, place, and time. She  has normal reflexes. No cranial nerve deficit. She exhibits normal muscle tone. Coordination normal.  Skin: Skin is warm and dry. No rash noted.  Crusted lesion right malar face secondary to recent excisional biopsy  Psychiatric: She has a normal mood and affect. Her behavior is normal.          Assessment & Plan:   Preventive health exam Subsequent Medicare wellness visit Dyslipidemia continue statin therapy History of interstitial lung disease.  Follow-up pulmonary medicine Carotid artery disease.  Continue aggressive risk factor modification statin therapy and serial carotid artery Doppler studies  Annual mammogram encouraged Pulmonary follow-up Return in 1 year or as needed  [employer]

## 2018-04-01 ENCOUNTER — Encounter: Payer: Self-pay | Admitting: Internal Medicine

## 2018-04-02 ENCOUNTER — Other Ambulatory Visit: Payer: Self-pay

## 2018-04-02 ENCOUNTER — Encounter (HOSPITAL_COMMUNITY): Payer: Self-pay | Admitting: Emergency Medicine

## 2018-04-02 ENCOUNTER — Emergency Department (HOSPITAL_COMMUNITY)
Admission: EM | Admit: 2018-04-02 | Discharge: 2018-04-02 | Disposition: A | Discharge status: Home / Self Care | Payer: Medicare Other | Attending: Emergency Medicine | Admitting: Emergency Medicine

## 2018-04-02 DIAGNOSIS — R61 Generalized hyperhidrosis: Secondary | ICD-10-CM | POA: Insufficient documentation

## 2018-04-02 DIAGNOSIS — Z7982 Long term (current) use of aspirin: Secondary | ICD-10-CM | POA: Insufficient documentation

## 2018-04-02 DIAGNOSIS — I471 Supraventricular tachycardia: Secondary | ICD-10-CM

## 2018-04-02 DIAGNOSIS — R531 Weakness: Secondary | ICD-10-CM | POA: Diagnosis not present

## 2018-04-02 DIAGNOSIS — Z79899 Other long term (current) drug therapy: Secondary | ICD-10-CM | POA: Insufficient documentation

## 2018-04-02 DIAGNOSIS — R0602 Shortness of breath: Secondary | ICD-10-CM | POA: Insufficient documentation

## 2018-04-02 DIAGNOSIS — Z96642 Presence of left artificial hip joint: Secondary | ICD-10-CM | POA: Insufficient documentation

## 2018-04-02 DIAGNOSIS — R000 Tachycardia, unspecified: Secondary | ICD-10-CM | POA: Diagnosis present

## 2018-04-02 LAB — CBC WITH DIFFERENTIAL/PLATELET
BASOS PCT: 1 %
Basophils Absolute: 0.1 10*3/uL (ref 0.0–0.1)
EOS ABS: 0.2 10*3/uL (ref 0.0–0.7)
Eosinophils Relative: 2 %
HEMATOCRIT: 47 % — AB (ref 36.0–46.0)
Hemoglobin: 15.3 g/dL — ABNORMAL HIGH (ref 12.0–15.0)
Lymphocytes Relative: 36 %
Lymphs Abs: 3.7 10*3/uL (ref 0.7–4.0)
MCH: 29.9 pg (ref 26.0–34.0)
MCHC: 32.6 g/dL (ref 30.0–36.0)
MCV: 91.8 fL (ref 78.0–100.0)
MONOS PCT: 7 %
Monocytes Absolute: 0.7 10*3/uL (ref 0.1–1.0)
Neutro Abs: 5.6 10*3/uL (ref 1.7–7.7)
Neutrophils Relative %: 54 %
Platelets: 233 10*3/uL (ref 150–400)
RBC: 5.12 MIL/uL — ABNORMAL HIGH (ref 3.87–5.11)
RDW: 12.5 % (ref 11.5–15.5)
WBC: 10.3 10*3/uL (ref 4.0–10.5)

## 2018-04-02 LAB — BASIC METABOLIC PANEL
Anion gap: 15 (ref 5–15)
BUN: 13 mg/dL (ref 6–20)
CALCIUM: 9.4 mg/dL (ref 8.9–10.3)
CO2: 22 mmol/L (ref 22–32)
CREATININE: 0.97 mg/dL (ref 0.44–1.00)
Chloride: 103 mmol/L (ref 101–111)
GFR calc non Af Amer: 53 mL/min — ABNORMAL LOW (ref >60)
Glucose, Bld: 190 mg/dL — ABNORMAL HIGH (ref 65–99)
Potassium: 3.9 mmol/L (ref 3.5–5.1)
SODIUM: 140 mmol/L (ref 135–145)

## 2018-04-02 LAB — MAGNESIUM: MAGNESIUM: 2.5 mg/dL — AB (ref 1.7–2.4)

## 2018-04-02 MED ORDER — METOPROLOL SUCCINATE 12.5 MG HALF TABLET
12.5000 mg | ORAL_TABLET | Freq: Once | ORAL | Status: DC
  Filled 2018-04-02 (×2): qty 1

## 2018-04-02 MED ORDER — METOPROLOL SUCCINATE ER 25 MG PO TB24
25.0000 mg | ORAL_TABLET | Freq: Once | ORAL | Status: DC
  Filled 2018-04-02: qty 1

## 2018-04-02 MED ORDER — METOPROLOL SUCCINATE ER 25 MG PO TB24: 12.5000 mg | ORAL_TABLET | Freq: Every day | ORAL | 0 refills | Status: DC

## 2018-04-02 NOTE — ED Provider Notes (Addendum)
[facility] EMERGENCY DEPARTMENT Provider Note   CSN: [id number] Arrival date & time: 04/02/18  1406     History   Chief Complaint Chief Complaint  Patient presents with  . Tachycardia    Mary Summers is a 84 y.o. female with past medical history of interstitial lung disease, peripheral vascular disease, hyperlipidemia, presenting to the ED with diaphoresis, weakness, and shortness of breath.  In triage, patient noted to be in SVT rate in the 240s.  Patient was immediately roomed and immediately evaluated by myself.    Patient states she had an episode this noon at church where she felt diaphoretic and weak.  She states the symptoms resolved spontaneously, however she had a second episode which lasted between 30-45 minutes.  Patient states she initially reported urgent care who recommended she report to the ED due to her fast rate.  She states by the time she arrived to the ED the episode was 30-45 minutes in duration.  States she felt short of breath, sweaty, and weak.  Denies chest pain, lightheadedness, or any other complaints.  Patient states she has no known previous diagnosis of SVT, however in February she had similar episodes where she felt the exact same way.  The episodes resolved spontaneously.  Her cardiologist is Dr. Debara Pickett. She is not on anticoagulation.  The history is provided by the patient.    Past Medical History:  Diagnosis Date  . Arthritis   . Hyperlipidemia   . Peripheral vascular disease ([place])    MILD CAROTID DISEASE BY DOPPLER STUDY  11/13/12. -REPORT FROM DR[facility]  . Seasonal allergies    Patient Active Problem List   Diagnosis Date Noted  . Pure hypercholesterolemia 03/18/2017  . Interstitial lung disease ([place]) 12/24/2016  . Chronic seasonal allergic rhinitis 11/02/2016  . Arthritis 02/25/2016  . DOE (dyspnea on exertion) 12/30/2015  . Peripheral vascular disease ([place]) 11/22/2014  . Mild  atherosclerosis of carotid artery 11/22/2014  . OA (osteoarthritis) of hip 11/29/2012  . Hip pain 11/21/2011  . Hyperlipidemia 01/17/2009   Past Surgical History:  Procedure Laterality Date  . BREAST SURGERY     BREAST BIOPSIES X 2  - NO CANCER  . congenitally absent uterus    . DOPPLER ECHOCARDIOGRAPHY    . LASER VOCAL CORD FOR NODULE  1985    . TOTAL HIP ARTHROPLASTY  11/29/2012   Procedure: TOTAL HIP ARTHROPLASTY ANTERIOR APPROACH;  Surgeon: Gearlean Alf, MD;  Location: [facility];  Service: Orthopedics;  Laterality: Left;     OB History   None      Home Medications    Prior to Admission medications   Medication Sig Start Date End Date Taking? Authorizing Provider  Ascorbic Acid (VITAMIN C PO) Take 1 tablet by mouth daily.   Yes [provider]  aspirin 81 MG tablet Take 81 mg by mouth daily.   Yes [provider]  atorvastatin (LIPITOR) 40 MG tablet Take 1 tablet (40 mg total) by mouth daily. 04/22/17  Yes Hilty, Nadean Corwin, MD  beta carotene 30 MG capsule Take 30 mg by mouth daily.   Yes [provider]  calcium gluconate 500 MG tablet Take 2 tablets by mouth daily.   Yes [provider]  ciclopirox (PENLAC) 8 % solution Apply topically at bedtime. Apply over nail and surrounding skin. Apply daily over previous coat. After seven (7) days, may remove with alcohol and continue cycle. 11/21/13  Yes  Marletta Lor, MD  co-enzyme Q-10 30 MG capsule Take 30 mg by mouth daily.   Yes [provider]  fluticasone (FLONASE) 50 MCG/ACT nasal spray Place 1 spray into both nostrils daily as needed for allergies or rhinitis. ONCE A DAY IF NEEDED 11/21/13  Yes Marletta Lor, MD  glucosamine-chondroitin 500-400 MG tablet Take 2 tablets by mouth daily.   Yes [provider]  Multiple Vitamin (MULTIVITAMIN) tablet Take 1 tablet by mouth daily.    Yes [provider]  Omega-3 Fatty Acids (OMEGA 3 PO) Take 1 capsule by mouth  daily.   Yes [provider]  SELENIUM PO Take 1 tablet by mouth daily.   Yes [provider]  vitamin E 400 UNIT capsule Take 400 Units by mouth daily.   Yes [provider]  metoprolol succinate (TOPROL-XL) 25 MG 24 hr tablet Take 0.5 tablets (12.5 mg total) by mouth daily for 15 days. 04/02/18 04/17/18  Ryota Treece, [place] N, PA-C    Family History Family History  Problem Relation Age of Onset  . Arthritis Mother   . Heart failure Father   . Arthritis Sister   . Lung disease Neg Hx   . Rheumatologic disease Neg Hx     Social History Social History   Tobacco Use  . Smoking status: Never Smoker  . Smokeless tobacco: Never Used  Substance Use Topics  . Alcohol use: Yes    Alcohol/week: 0.0 oz    Comment: OCCASIONAL ALCOHOL  . Drug use: No     Allergies   Patient has no known allergies.   Review of Systems Review of Systems  Constitutional: Positive for diaphoresis.  Respiratory: Positive for shortness of breath.   Cardiovascular: Negative for chest pain.  Neurological: Positive for weakness.  All other systems reviewed and are negative.    Physical Exam Updated Vital Signs BP 100/61   Pulse (!) 134   Resp (!) 21   SpO2 95%   Physical Exam  Constitutional: She is oriented to person, place, and time. She appears well-developed and well-nourished.  HENT:  Head: Normocephalic and atraumatic.  Eyes: Conjunctivae are normal.  Neck: Normal range of motion. Neck supple.  Cardiovascular: An irregular rhythm present.  tachycardic  Pulmonary/Chest: Effort normal and breath sounds normal. No respiratory distress.  Abdominal: Soft. Bowel sounds are normal. She exhibits no distension. There is no tenderness.  Musculoskeletal: She exhibits no edema.  Neurological: She is alert and oriented to person, place, and time.  Skin: Skin is warm. She is diaphoretic.  Psychiatric: She has a normal mood and affect. Her behavior is normal.  Nursing note and  vitals reviewed.    ED Treatments / Results  Labs (all labs ordered are listed, but only abnormal results are displayed) Labs Reviewed  CBC WITH DIFFERENTIAL/PLATELET - Abnormal; Notable for the following components:      Result Value   RBC 5.12 (*)    Hemoglobin 15.3 (*)    HCT 47.0 (*)    All other components within normal limits  BASIC METABOLIC PANEL - Abnormal; Notable for the following components:   Glucose, Bld 190 (*)    GFR calc non Af Amer 53 (*)    All other components within normal limits  MAGNESIUM - Abnormal; Notable for the following components:   Magnesium 2.5 (*)    All other components within normal limits    EKG EKG Interpretation  Date/Time:  Sunday April 02 2018 14:12:37 EDT Ventricular Rate:  239 PR Interval:    QRS Duration: 126 QT Interval:  172 QTC Calculation: 343 R Axis:   -18 Text Interpretation:  Wide QRS tachycardia Nonspecific intraventricular block T wave abnormality, consider inferior ischemia, T wave abnormality, consider anterolateral ischemia Abnormal ekg Confirmed by Carmin Muskrat [phone]) on 04/02/2018 3:48:11 PM  EKG Interpretation  Date/Time:  Sunday April 02 2018 14:22:15 EDT Ventricular Rate:  127 PR Interval:    QRS Duration: 92 QT Interval:  297 QTC Calculation: 432 R Axis:   14 Text Interpretation:  Sinus tachycardia ST-t wave abnormality Abnormal ekg Confirmed by Carmin Muskrat [phone]) on 04/02/2018 3:48:48 PM   Radiology No results found.  Procedures Procedures (including critical care time)  CRITICAL CARE Performed by: [place] N Caisley Baxendale   Total critical care time: 40 minutes  Critical care time was exclusive of separately billable procedures and treating other patients.  Critical care was necessary to treat or prevent imminent or life-threatening deterioration.  Critical care was time spent personally by me on the following activities: development of treatment plan with patient and/or surrogate as well as  nursing, discussions with consultants, evaluation of patient's response to treatment, examination of patient, obtaining history from patient or surrogate, ordering and performing treatments and interventions, ordering and review of laboratory studies, ordering and review of radiographic studies, pulse oximetry and re-evaluation of patient's condition.   Medications Ordered in ED Medications  metoprolol succinate (TOPROL-XL) 24 hr tablet 12.5 mg (has no administration in time range)     Initial Impression / Assessment and Plan / ED Course  I have reviewed the triage vital signs and the nursing notes.  Pertinent labs & imaging results that were available during my care of the patient were reviewed by me and considered in my medical decision making (see chart for details).  Clinical Course as of Apr 02 1613  Sun Apr 02, 2018  1420 Patient presented to the ED in SVT with rate in 240s.  Patient diaphoretic though alert and not in distress.  Vagal maneuvers attempted at bedside.  Successfully converted to sinus rhythm with rate in 120s after third vagal attempt.  Patient reporting significant improvement in symptoms.  Will get basic labs and monitor.   [JR]  1551 Spoke with Dr. Aundra Dubin with cardiology.  He recommends she be started on toprolol extended release daily, and follow-up with Dr. Debara Pickett this week.  Recommends no further intervention in the ED.     [JR]    Clinical Course User Index [JR] Chizuko Trine, [place] N, PA-C    ED for diaphoresis, shortness of breath, and weakness.  Patient found to be in SVT in triage with rate in 240s.  Patient roomed immediately, on my evaluation she is alert and not in distress, however diaphoretic and appears uncomfortable.  Vagal maneuvers attempted, with successful conversion to sinus rhythm after 3 attempts.  Repeat EKG showing patient in sinus rhythm.  Patient monitored in the ED sinus rhythm with rate in 120s-130s.  Reporting significant improvement in  symptoms, stating she is no longer diaphoretic or short of breath.  Magnesium within normal limits.  Normal electrolytes.  No prior documented history of SVT, not on beta blockers or anticoagulation.  Cardiology consulted, spoke with Dr. Aundra Dubin, who recommends Toprol-XL daily and outpatient follow-up with her cardiologist, Dr. Debara Pickett.  States she is safe for discharge at this time.  Patient discussed with and evaluated by Dr. Vanita Panda, who agrees with care plan for discharge. Strict return precautions discussed, and pt verbalized understanding.  Discussed results, findings, treatment and follow up. Patient advised of return precautions. Patient verbalized understanding and agreed with plan.  Final Clinical Impressions(s) / ED Diagnoses   Final diagnoses:  SVT (supraventricular tachycardia) [facility])    ED Discharge Orders        Ordered    metoprolol succinate (TOPROL-XL) 25 MG 24 hr tablet  Daily     04/02/18 1602       Karson Reede, [place] N, PA-C 04/02/18 1609    Wentworth Edelen, [place] N, [place] 04/02/18 1615    Carmin Muskrat, MD 04/02/18 [phone]

## 2018-04-02 NOTE — ED Triage Notes (Signed)
Patient presents to ED for assessment of 30 minutes of SOB, sudden onset at church.  EKG found to read 240 heart rate in triage.  Moved to room for further assessment

## 2018-04-02 NOTE — ED Notes (Signed)
Spoke with EDP [place], states pt is okay to leave and fill prescription

## 2018-04-02 NOTE — Discharge Instructions (Addendum)
Schedule an appointment with your Cardiologist, Dr. Debara Pickett, for follow up this week on your visit today. Begin taking the prescribed medication, metoprolol, once daily.  Do no over-exert yourself. Limit your caffeine intake. Return to the ER if you feel a similar episode occurring, if you have chest pain, shortness of breathing, or new or concerning symptoms.

## 2018-04-02 NOTE — ED Notes (Signed)
Pt vomited large amount of yellow colored emesis at [facility] and was diaphoretic.

## 2018-04-02 NOTE — ED Notes (Signed)
Pharmacy called x2 for medication, states it will be sent. Pt family requesting to leave.

## 2018-04-03 ENCOUNTER — Telehealth: Payer: Self-pay | Admitting: Internal Medicine

## 2018-04-03 NOTE — Telephone Encounter (Signed)
Returned call to patient, patient states she was in the ED yesterday with HR 240s.   Was started on metoprolol XL 12.5 mg daily.  Patient wanted to make sure this was okay.     Per chart review: patient had episode of SVT HR 240s yesterday, converted with vagal maneuvers.  Patient was started on medication and scheduled for follow up outpatient.    Advised patient to take medication as prescribed.  She is doing well today and will keep follow up with Dr. Debara Pickett 4/26.  Advised to call back with questions or concerns.  Patient verbalized understanding.    Routed to MD as Juluis Rainier

## 2018-04-03 NOTE — Telephone Encounter (Signed)
Ne Message  Pt says she forgot to ask a question when she previosuly spoke with a nurse and is requesting a call back.

## 2018-04-03 NOTE — Telephone Encounter (Signed)
Patient as a responsibility to sing at her church next week. She will be singing a solo that lasts 5 minutes and wants to know if this is OK since she recently had SVT. She does report she is doing well on her beta-blocker. Advised she can do her performance

## 2018-04-03 NOTE — Telephone Encounter (Signed)
error 

## 2018-04-03 NOTE — Telephone Encounter (Signed)
New Message:      Pt states she was in the [facility] over the weekend and they have added  metoprolol succinate (TOPROL-XL) 25 MG 24 hr tablet to her list of medications. Pt has and appt on 4/26 w/Hilty but wants to make sure this medication is okay for her to take.

## 2018-04-14 ENCOUNTER — Encounter: Payer: Self-pay | Admitting: Internal Medicine

## 2018-04-14 ENCOUNTER — Ambulatory Visit (INDEPENDENT_AMBULATORY_CARE_PROVIDER_SITE_OTHER): Payer: Medicare Other | Admitting: Internal Medicine

## 2018-04-14 VITALS — BP 124/68 | HR 79 | Ht 67.0 in | Wt 191.0 lb

## 2018-04-14 DIAGNOSIS — I739 Peripheral vascular disease, unspecified: Secondary | ICD-10-CM

## 2018-04-14 DIAGNOSIS — E785 Hyperlipidemia, unspecified: Secondary | ICD-10-CM

## 2018-04-14 DIAGNOSIS — I471 Supraventricular tachycardia: Secondary | ICD-10-CM | POA: Diagnosis not present

## 2018-04-14 MED ORDER — METOPROLOL SUCCINATE ER 25 MG PO TB24: 12.5000 mg | ORAL_TABLET | Freq: Every day | ORAL | 1 refills | Status: DC

## 2018-04-14 NOTE — Patient Instructions (Signed)
Your physician recommends that you return for lab work FASTING to check cholesterol   Your physician wants you to follow-up in: ONE YEAR with Dr. Hilty. You will receive a reminder letter in the mail two months in advance. If you don't receive a letter, please call our office to schedule the follow-up appointment.   

## 2018-04-16 ENCOUNTER — Encounter: Payer: Self-pay | Admitting: Internal Medicine

## 2018-04-16 DIAGNOSIS — I471 Supraventricular tachycardia, unspecified: Secondary | ICD-10-CM | POA: Insufficient documentation

## 2018-04-16 NOTE — Progress Notes (Signed)
OFFICE NOTE  Chief Complaint:  Follow- up ER visit for SVT  Primary Care Physician: Marletta Lor, MD  HPI:  Mary Summers  is a 84 year old female who has done generally very well. Her husband also is a patient of mine. Actually, last year she had undergone a [employer] mobile screening test which showed mild carotid artery disease and low Doppler velocities bilaterally in the carotids. She also has some increased risk for osteoporosis and dyslipidemia with an elevated total cholesterol of 212, LDL of 136 at the time. She was started on low dose pravastatin and a comprehensive lipid profile was obtained including NMR. Her NMR demonstrated actually high particle number relative to her LDL cholesterol 1736 with a calculated LDL of 148, HDL of 64 and high small LDL particle number 536. Overall, an atherogenic lipid profile. I recommended then changing to a moderate to high intensity statin, specifically atorvastatin 40 mg daily. She did make that switch, seems to be tolerating the atorvastatin without any significant myalgias or any other problems with medications. In December, she underwent left hip replacement by Dr. Wynelle Link and is doing very well with increased activity and mobility due to that. Overall not complaining of any cardiac symptoms including shortness of breath, palpitations, chest pain, presyncope or other syncopal symptoms.   No new events are noted since I last saw her. She continues to take her cholesterol medicine is due for recheck to her primary care provider in a couple of weeks. She does report some nocturnal productive cough and was told before that she may have slight overinflation of her lungs. I wonder she has some element of chronic bronchitis or asthmatic bronchitis.  Mary Summers returns today for follow-up with her husband. She reports that she's had some progressive shortness of breath which has been worsening over the past year. Particularly her symptoms are  at night and she notes increased mucus production. As we discussed previously she does have a history of COPD. This was recently presented to her primary care provider who got an x-ray of her last week. The chest x-ray demonstrates progressive interstitial lung disease as well as COPD. She also was noted to have onychomycosis of her toenails. It was recommended that she go on to Lamisil, but that she discontinue her Lipitor while on treatment with that medication.   03/18/2017  Mary Summers was seen today in follow-up. She recently had similar infection that her husband had and ultimately developed some diverticulitis. She is currently on ciprofloxacin and metronidazole. She said her symptoms are improving. In the past she has had evaluation of prominent findings on her chest x-ray consistent with COPD or possible interstitial lung disease. She also had a high-resolution CT which suggested some interstitial process however a clear diagnosis was not made. She's not currently on any treatment. She has been on statin therapy and total cholesterol is 166, HDL 78, LDL-C 73, triglycerides 75. As with her husband she does have bilateral carotid artery disease as well.  04/14/2018  Mary Summers returns today for an ER follow-up.  She had noted several episodes of transient lightheadedness and weakness, culminating in one episode when she was in church where she nearly passed out.  She came to the emergency department and was noted to be markedly tachycardic with heart rates in the 180s.  This was a narrow complex tachycardia.  The ER visit was well described and included vagal maneuvers which ultimately broke her out of her SVT.  She was  then started on Toprol which she takes and is tolerating well.  Since then she feels actually a lot better with fewer of the normal palpitations that she had and no further episodes of SVT.  PMHx:  Past Medical History:  Diagnosis Date  . Arthritis   . Hyperlipidemia   .  Peripheral vascular disease ([place])    MILD CAROTID DISEASE BY DOPPLER STUDY  11/13/12. -REPORT FROM [facility]  . Seasonal allergies     Past Surgical History:  Procedure Laterality Date  . BREAST SURGERY     BREAST BIOPSIES X 2  - NO CANCER  . congenitally absent uterus    . DOPPLER ECHOCARDIOGRAPHY    . LASER VOCAL CORD FOR NODULE  1985    . TOTAL HIP ARTHROPLASTY  11/29/2012   Procedure: TOTAL HIP ARTHROPLASTY ANTERIOR APPROACH;  Surgeon: Gearlean Alf, MD;  Location: [facility];  Service: Orthopedics;  Laterality: Left;    FAMHx:  Family History  Problem Relation Age of Onset  . Arthritis Mother   . Heart failure Father   . Arthritis Sister   . Lung disease Neg Hx   . Rheumatologic disease Neg Hx     SOCHx:   reports that she has never smoked. She has never used smokeless tobacco. She reports that she drinks alcohol. She reports that she does not use drugs.  ALLERGIES:  No Known Allergies  ROS: Pertinent items noted in HPI and remainder of comprehensive ROS otherwise negative.  HOME MEDS: Current Outpatient Medications  Medication Sig Dispense Refill  . Ascorbic Acid (VITAMIN C PO) Take 1 tablet by mouth daily.    Marland Kitchen aspirin 81 MG tablet Take 81 mg by mouth daily.    Marland Kitchen atorvastatin (LIPITOR) 40 MG tablet Take 1 tablet (40 mg total) by mouth daily. 30 tablet 11  . beta carotene 30 MG capsule Take 30 mg by mouth daily.    . calcium gluconate 500 MG tablet Take 2 tablets by mouth daily.    . ciclopirox (PENLAC) 8 % solution Apply topically at bedtime. Apply over nail and surrounding skin. Apply daily over previous coat. After seven (7) days, may remove with alcohol and continue cycle. 6.6 mL 0  . co-enzyme Q-10 30 MG capsule Take 30 mg by mouth daily.    . fluticasone (FLONASE) 50 MCG/ACT nasal spray Place 1 spray into both nostrils daily as needed for allergies or rhinitis. ONCE A DAY IF NEEDED 16 g 2  . glucosamine-chondroitin 500-400 MG  tablet Take 2 tablets by mouth daily.    . metoprolol succinate (TOPROL-XL) 25 MG 24 hr tablet Take 0.5 tablets (12.5 mg total) by mouth daily. 45 tablet 1  . Multiple Vitamin (MULTIVITAMIN) tablet Take 1 tablet by mouth daily.     . Omega-3 Fatty Acids (OMEGA 3 PO) Take 1 capsule by mouth daily.    . SELENIUM PO Take 1 tablet by mouth daily.    . vitamin E 400 UNIT capsule Take 400 Units by mouth daily.     No current facility-administered medications for this visit.     LABS/IMAGING: No results found for this or any previous visit (from the past 48 hour(s)). No results found.  VITALS: BP 124/68 (BP Location: Left Arm, Patient Position: Sitting, Cuff Size: Normal)   Pulse 79   Ht 5\' 7"  (1.702 m)   Wt 191 lb (86.6 kg)   BMI 29.91 kg/m   EXAM: General appearance: alert and no distress  Neck: no carotid bruit, no JVD and thyroid not enlarged, symmetric, no tenderness/mass/nodules Lungs: diminished breath sounds bilaterally Heart: regular rate and rhythm Abdomen: soft, non-tender; bowel sounds normal; no masses,  no organomegaly Extremities: extremities normal, atraumatic, no cyanosis or edema Pulses: 2+ and symmetric Skin: Skin color, texture, turgor normal. No rashes or lesions Neurologic: Grossly normal  EKG: Sinus rhythm at 79-personally reviewed  ASSESSMENT: 1. PSVT - responded to vagal maneuvers 2. CT and x-ray findings of COPD/interstitial lung disease 3. Dyslipidemia 4. Mild bilateral carotid artery disease 5. Productive nocturnal cough - likely related to COPD 6. Onychomycosis  PLAN: 1.   Mary Summers had SVT which fortunately responded to vagal maneuvers.  She is now on beta-blocker and I suspect will have few if any recurrent episodes.  She denies any chest pain or worsening shortness of breath.  Cholesterol is been well controlled.  She can follow-up with me annually or sooner as necessary.  Pixie Casino, MD, [facility], [place] Director of the [facility]  Card[facility]ovascular [facility] Diplomate of the American Board of Clinical Lipidology Attending Cardiologist  Direct Dial: [phone]  Fax: [phone]  Website:  [email or web address] 04/16/2018, 9:37 PM

## 2018-04-17 ENCOUNTER — Encounter: Payer: Self-pay | Admitting: Internal Medicine

## 2018-04-18 LAB — LIPID PANEL
CHOL/HDL RATIO: 2.4 ratio (ref 0.0–4.4)
CHOLESTEROL TOTAL: 137 mg/dL (ref 100–199)
HDL: 57 mg/dL (ref >39)
LDL Calculated: 63 mg/dL (ref 0–99)
TRIGLYCERIDES: 87 mg/dL (ref 0–149)
VLDL CHOLESTEROL CAL: 17 mg/dL (ref 5–40)

## 2018-04-18 LAB — TSH: TSH: 3 u[IU]/mL (ref 0.450–4.500)

## 2018-04-24 ENCOUNTER — Other Ambulatory Visit: Payer: Self-pay | Admitting: Internal Medicine

## 2018-04-24 NOTE — Telephone Encounter (Signed)
REFILL 

## 2018-04-25 ENCOUNTER — Other Ambulatory Visit: Payer: Self-pay | Admitting: Internal Medicine

## 2018-04-26 ENCOUNTER — Encounter: Payer: Self-pay | Admitting: Internal Medicine

## 2018-04-26 DIAGNOSIS — J849 Interstitial pulmonary disease, unspecified: Secondary | ICD-10-CM

## 2018-07-27 ENCOUNTER — Telehealth: Payer: Self-pay | Admitting: Family Medicine

## 2018-07-27 NOTE — Telephone Encounter (Signed)
Copied from [place] [phone]. Topic: Appointment Scheduling - Scheduling Inquiry for Clinic >> Jul 27, 2018  3:29 PM Mary Summers, NT wrote: Reason for CRM: patient is call and she is currently out of town in [place], and she states she received a letter before leaving that Mary Summers would be retiring. Patient is requesting to transfer carew ith Mary Summers, I informed her that he is not accepting new patients at this time but we had other providers that were. Patient stated she has seen him in the past when Mary Summers was not available and she would like for me to send a message to see if he would. She can be reached internationally at the number provided below.   [phone]

## 2018-07-28 NOTE — Telephone Encounter (Signed)
Per Rachel Bo it is okay to call the pts international # that was given in the [place].  I called the pt and there was no answer or no way of leaving a msg.  I will call back at a later time to let her know of the msg from Dr. Elease Hashimoto.

## 2018-07-28 NOTE — Telephone Encounter (Signed)
I have seen her in past and will be happy to see her.

## 2018-08-02 ENCOUNTER — Other Ambulatory Visit: Payer: Self-pay | Admitting: Internal Medicine

## 2018-08-02 DIAGNOSIS — I471 Supraventricular tachycardia: Secondary | ICD-10-CM

## 2018-08-14 NOTE — Telephone Encounter (Signed)
Patient scheduled for 01/02/19.

## 2018-09-06 ENCOUNTER — Other Ambulatory Visit: Payer: Self-pay | Admitting: Internal Medicine

## 2018-09-07 ENCOUNTER — Other Ambulatory Visit: Payer: Self-pay | Admitting: Internal Medicine

## 2018-09-08 MED ORDER — ATORVASTATIN CALCIUM 40 MG PO TABS: 40.0000 mg | ORAL_TABLET | Freq: Every day | ORAL | 1 refills | Status: DC

## 2018-09-08 NOTE — Addendum Note (Signed)
Addended by: Caprice Beaver T on: 09/08/2018 09:59 AM   Modules accepted: Orders

## 2018-09-11 ENCOUNTER — Encounter: Payer: Self-pay | Admitting: Family Medicine

## 2018-09-11 ENCOUNTER — Ambulatory Visit (INDEPENDENT_AMBULATORY_CARE_PROVIDER_SITE_OTHER): Payer: Medicare Other | Admitting: Family Medicine

## 2018-09-11 ENCOUNTER — Other Ambulatory Visit: Payer: Self-pay

## 2018-09-11 VITALS — BP 130/70 | HR 98 | Temp 98.4°F | Ht 67.0 in | Wt 182.9 lb

## 2018-09-11 DIAGNOSIS — M25561 Pain in right knee: Secondary | ICD-10-CM

## 2018-09-11 DIAGNOSIS — I471 Supraventricular tachycardia: Secondary | ICD-10-CM | POA: Diagnosis not present

## 2018-09-11 DIAGNOSIS — R7309 Other abnormal glucose: Secondary | ICD-10-CM | POA: Diagnosis not present

## 2018-09-11 DIAGNOSIS — J849 Interstitial pulmonary disease, unspecified: Secondary | ICD-10-CM | POA: Diagnosis not present

## 2018-09-11 DIAGNOSIS — D869 Sarcoidosis, unspecified: Secondary | ICD-10-CM | POA: Diagnosis not present

## 2018-09-11 LAB — POCT GLYCOSYLATED HEMOGLOBIN (HGB A1C): HEMOGLOBIN A1C: 5.3 % (ref 4.0–5.6)

## 2018-09-11 MED ORDER — METOPROLOL SUCCINATE ER 50 MG PO TB24: 50.0000 mg | ORAL_TABLET | Freq: Every day | ORAL | 3 refills | Status: DC

## 2018-09-11 NOTE — Patient Instructions (Signed)
Supraventricular Tachycardia, Adult Supraventricular tachycardia (SVT) is a type of abnormal heart rhythm. It causes the heart to beat very quickly and then return to a normal speed. A normal heart rate is 60-100 beats per minute. During an episode of SVT, your heart rate may be higher than 150 beats per minute. Episodes of SVT can be frightening, but they are usually not dangerous. However, if episodes happens often or last for long periods of time, they may lead to heart failure. What are the causes? Usually, a normal heartbeat starts when an area called the sinoatrial node releases an electrical signal. In SVT, other areas of the heart send out electrical signals that interfere with the signal from the sinoatrial node. It is not known why some people get SVT and others do not. What increases the risk? This condition is more likely to develop in:  People who are 31?82 years old.  Women.  Factors that may increase your chances of an attack include:  Stress.  Tiredness.  Smoking.  Stimulant drugs, such as cocaine and methamphetamine.  Alcohol.  Caffeine.  Pregnancy.  Anxiety.  What are the signs or symptoms? Symptoms of this condition include:  A pounding heart.  A feeling that the heart is skipping beats (palpitations).  Weakness.  Shortness of breath.  Tightness or pain in your chest.  Light-headedness.  Anxiety.  Dizziness.  Sweating.  Nausea.  Fainting.  Fatigue or tiredness.  A mild episode may not cause symptoms. How is this diagnosed? This condition may be diagnosed based on:  Your symptoms.  A physical exam. If you are have an episode of SVT during the exam, the health care provider may be able to diagnose SVT by listening to your heart and feeling your pulse.  Tests. These may include: ? An electrocardiogram (ECG). This test is done to check for problems with electrical activity in the heart. ? A Holter monitor or event monitor test. This  test involves wearing a portable device that monitors your heart rate over time. ? An echocardiogram. This test involves taking an image of your heart using sound waves. It is done to rule out other causes of a fast heart rate. ? Blood tests.  How is this treated? This condition may be treated with:  Vagal nerve stimulation. The treatment involves stimulating your vagus nerve, which slows down the heart. It is often the first and only treatment that is needed for this condition. It is a good idea to try the several ways of doing vagal stimulation to find which one works best for you. Ways to do this treatment include: ? Holding your breath and pushing, as though you are having a bowel movement. ? Massaging an area on one side of your neck, below your jaw. Do not try this yourself. Only a health care provider should do this. If done the wrong way, it can lead to a stroke. ? Bending forward with your head between your legs. ? Coughing while bending forward with your head between your legs. ? Closing your eyes and massaging your eyeballs. A health care provider should guide you through this method before you try it on your own.  Medicines that prevent attacks.  Medicine to stop an attack. The medicine is given through an IV tube at the hospital.  A small electric shock (cardioversion) that stops an attack. Before you get the shock, you will get medicine to make you fall asleep.  Radiofrequency ablation. In this procedure, a small, thin tube (catheter)  is used to send radiofrequency energy to the area of tissue that is causing the rapid heartbeats. The energy kills the cells and helps your heart keep a normal rhythm. You may have this treatment if you have symptoms of SVT often.  If you do not have symptoms, you may not need treatment. Follow these instructions at home: Stress  Avoid stressful situations when possible.  Find healthy ways of managing stress that work for you. Some healthy ways  to manage stress include: ? Taking part in relaxing activities, such as yoga, meditation, or being out in nature. ? Listening to relaxing music. ? Practicing relaxation techniques, such as deep breathing. ? Leading a healthy lifestyle. This involves getting plenty of sleep, exercising, and eating a balanced diet. ? Attending counseling or talk therapy with a mental health professional. Sleep  Try to get at least 7 hours of sleep each night. Tobacco and nicotine  Do not use any products that contain nicotine or tobacco, such as cigarettes and e-cigarettes. If you need help quitting, ask your health care provider. Alcohol  If alcohol triggers episodes of SVT, do not drink alcohol.  If alcohol does not seem to trigger episodes, limit alcohol intake to no more than 1 drink a day for nonpregnant women and 2 drinks a day for men. One drink equals 12 oz of beer, 5 oz of wine, or 1 oz of hard liquor. Caffeine  If caffeine triggers episodes of SVT, do not eat, drink, or use anything with caffeine in it.  If caffeine does not seem to trigger episodes, consume caffeine in moderation. Stimulant drugs  Do not use stimulant drugs. If you need help quitting, talk with your health care provider. General instructions  Maintain a healthy weight.  Exercise regularly. Ask your health care provider to suggest some good activities for you. Aim for one or a combination of the following: ? 150 minutes per week of moderate exercise, such as walking or yoga. ? 75 minutes per week of vigorous exercise, such as running or swimming.  Perform vagus nerve stimulation as directed by your health care provider.  Take over-the-counter and prescription medicines only as told by your health care provider. Contact a health care provider if:  You have episodes of SVT more often than before.  Episodes of SVT last longer than before.  Vagus nerve stimulation is no longer helping.  You have new symptoms. Get  help right away if:  You have chest pain.  Your symptoms get worse.  You have trouble breathing.  You have an episode of SVT that lasts longer than 20 minutes.  You faint. These symptoms may represent a serious problem that is an emergency. Do not wait to see if the symptoms will go away. Get medical help right away. Call your local emergency services (911 in the [place] Do not drive yourself to the hospital. This information is not intended to replace advice given to you by your health care provider. Make sure you discuss any questions you have with your health care provider. Document Released: 12/06/2005 Document Revised: 08/12/2016 Document Reviewed: 08/12/2016 Elsevier Interactive Patient Education  2018 Elsevier Inc.  

## 2018-09-11 NOTE — Progress Notes (Signed)
Subjective:     Patient ID: Mary Summers, female   DOB: July 08, 1932, 84 y.o.   MRN: [id number]  HPI Patient seen for multiple issues as follows  Requesting referral for pulmonary. She has past history of reported interstitial lung disease. She went to [facility] back in May and had comprehensive workup there. There was concern for sarcoidosis. She was given dx of sarcoidosis.  No biopsies done. Patient had fairly comprehensive workup there including echocardiogram, pulmonary function tests, CT chest, CT sinuses, barium swallow and multiple labs along with EKG and TTE  She had evidence for calcified bilateral intrathoracic lymphadenopathy  She states she's had a couple years of some worsening shortness of breath and cough -especially at night. She is requesting referral to get established with local pulmonologist. They did not recommend starting any medications at this point.  No steroid use.  Patient's had some sporadic elevated glucose levels in past but not clear these were fasting. She was in [place] this past summer and had 1 glucose reading of 190 but again not clear this was fasting. She has not had any recent A1c. Denies any polyuria or polydipsia. Appetite and weight stable.  Right knee pain. One-week duration. She was pushing a trash with her leg and noted some pain afterwards. No swelling. No locking or giving way. She is actually scheduled to see orthopedist later this week.  Pain somewhat poorly localized.  No problem with ambulation.  History of PSVT. She had been on Toprol-XL 12.5 mgs daily. She had episode of tachycardia lasting about 45 minutes when she was in [place] and they increased her dosage to 50 mg. She's been stable since then with no breakthrough symptoms. Her heart rates been stable. No adverse side effects.  She had TSH level in [place] of 6 with normal free T4.    Past Medical History:  Diagnosis Date  . Arthritis   . Hyperlipidemia   . Peripheral  vascular disease ([place])    MILD CAROTID DISEASE BY DOPPLER STUDY  11/13/12. -REPORT FROM [facility]  . Seasonal allergies    Past Surgical History:  Procedure Laterality Date  . BREAST SURGERY     BREAST BIOPSIES X 2  - NO CANCER  . congenitally absent uterus    . DOPPLER ECHOCARDIOGRAPHY    . LASER VOCAL CORD FOR NODULE  1985    . TOTAL HIP ARTHROPLASTY  11/29/2012   Procedure: TOTAL HIP ARTHROPLASTY ANTERIOR APPROACH;  Surgeon: Gearlean Alf, MD;  Location: [facility];  Service: Orthopedics;  Laterality: Left;    reports that she has never smoked. She has never used smokeless tobacco. She reports that she drinks alcohol. She reports that she does not use drugs. family history includes Arthritis in her mother and sister; Heart failure in her father. No Known Allergies   Review of Systems  Constitutional: Negative for fatigue and unexpected weight change.  Eyes: Negative for visual disturbance.  Respiratory: Positive for cough and shortness of breath. Negative for chest tightness and wheezing.   Cardiovascular: Negative for chest pain, palpitations and leg swelling.  Endocrine: Negative for polydipsia and polyuria.  Musculoskeletal: Positive for arthralgias.  Neurological: Negative for dizziness, seizures, syncope, weakness, light-headedness and headaches.       Objective:   Physical Exam  Constitutional: She appears well-developed and well-nourished.  Neck: Neck supple. No thyromegaly present.  Cardiovascular: Normal rate and regular rhythm.  Pulmonary/Chest: Effort normal and breath sounds normal.  Musculoskeletal:  She exhibits no edema.  Right knee full ROM.  No effusion.  No warmth.  No ecchymosis.  No localized tenderness.  Lymphadenopathy:    She has no cervical adenopathy.       Assessment:     #1 concern for pulmonary sarcoidosis  #2 hyperglycemia by recent lab work.  ?non-fasting labs.  A1C today 5.3%.    #3 history of PSVT  currently stable on dosage of metoprolol 50 mg  #4 right knee pain- suspected strain.    Plan:     -set up pulmonary referral regarding her question of pulmonary sarcoidosis -refilled Toprol-XL 50 mg 1 daily -Follow-up promptly for any recurrent tachyarrhythmia episodes -She had recent TSH which was normal- slightly high when in [place]. -Check hemoglobin A1c- as above.  Watch sugars and starches closely.  Eulas Post MD [facility]

## 2018-09-12 ENCOUNTER — Encounter: Payer: Self-pay | Admitting: Family Medicine

## 2018-09-12 LAB — TSH: TSH: 2.24 u[IU]/mL (ref 0.35–4.50)

## 2018-09-12 LAB — T4, FREE: FREE T4: 1 ng/dL (ref 0.60–1.60)

## 2018-09-21 ENCOUNTER — Encounter: Payer: Self-pay | Admitting: Family Medicine

## 2018-09-22 ENCOUNTER — Ambulatory Visit (INDEPENDENT_AMBULATORY_CARE_PROVIDER_SITE_OTHER): Payer: Medicare Other | Admitting: Pulmonary Disease

## 2018-09-22 ENCOUNTER — Encounter: Payer: Self-pay | Admitting: Pulmonary Disease

## 2018-09-22 DIAGNOSIS — J849 Interstitial pulmonary disease, unspecified: Secondary | ICD-10-CM | POA: Diagnosis not present

## 2018-09-22 NOTE — Progress Notes (Signed)
Mary Summers    [id number]    May 09, 1932  Primary Care Physician:Kwiatkowski, Doretha Sou, MD  Referring Physician: Marletta Lor, MD [place], [place] [address]  Chief complaint: Follow-up for interstitial lung disease, sarcoidosis  HPI: Previously followed by Dr. Ashok Cordia at [facility] in 2017 with CT scan showing mediastinal adenopathy, calcification, lung nodule.  Initial work-up showing elevated ACE level, rheumatoid factor, SSA.  She is evaluated by Dr. Amil Amen, Rheumatology with no clinical evidence of connective tissue disease or Sjogren's  She is had an evaluation at [facility], [place] May 2019 with repeat high-resolution CT showing upper lobe nodules, calcified lymph nodes, PFTs with no obvious deficit, excellent exercise test.  Swallow eval showed mild to moderate aspiration with recommendations to minimize swallow dysfunction.  CT scanning of the sinuses showed mild sinusitis, rhinitis.  Echocardiogram showed normal LV function, elevated RVSP. Assessment from [place] was that ILD is consistent with sarcoidosis.  No need for treatment at present Follow lung function, chest imaging.  She will need work-up for pulmonary hypertension.  No symptoms today.  She has minimal dyspnea on exertion, minimal cough, nonproductive in nature.  Occasional joint pain, swelling, difficulty swallowing food, dry mouth, dry eyes.  Notes history of Zenker's diverticulum. She continues to maintain an active lifestyle.  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  She spends about half the year in [place], [place]  Pets: No pets, dogs, birds Occupation: Retired Arts administrator Smoking history: Never smoker  Travel history: Travel to [place]. Relevant family history: Significant family history of lung disease.  Comprehensive ILD questionnaire  09/22/2018-negative for environmental exposures, occupational exposures,  medications.  She used to own a feather pillow and comforter several years ago but she got rid of it.  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 Ascorbic Acid (VITAMIN C PO) Take 1 tablet by mouth daily.  Marland Kitchen aspirin 81 MG tablet Take 81 mg by mouth daily.  Marland Kitchen atorvastatin (LIPITOR) 40 MG tablet Take 1 tablet (40 mg total) by mouth daily.  . beta carotene 30 MG capsule Take 30 mg by mouth daily.  . calcium gluconate 500 MG tablet Take 2 tablets by mouth daily.  . ciclopirox (PENLAC) 8 % solution Apply topically at bedtime. Apply over nail and surrounding skin. Apply daily over previous coat. After seven (7) days, may remove with alcohol and continue cycle.  Marland Kitchen co-enzyme Q-10 30 MG capsule Take 30 mg by mouth daily.  . fluticasone (FLONASE) 50 MCG/ACT nasal spray Place 1 spray into both nostrils daily as needed for allergies or rhinitis. ONCE A DAY IF NEEDED  . glucosamine-chondroitin 500-400 MG tablet Take 2 tablets by mouth daily.  . metoprolol succinate (TOPROL-XL) 50 MG 24 hr tablet Take 1 tablet (50 mg total) by mouth daily. Take with or immediately following a meal.  . Multiple Vitamin (MULTIVITAMIN) tablet Take 1 tablet by mouth daily.   . Omega-3 Fatty Acids (OMEGA 3 PO) Take 1 capsule by mouth daily.  . SELENIUM PO Take 1 tablet by mouth daily.  . Turmeric 500 MG CAPS Take 1 tablet by mouth daily.  . vitamin E 400 UNIT capsule Take 400 Units by mouth daily.   No facility-administered encounter medications on file as of 09/22/2018.     Allergies as of 09/22/2018  . (No  Known Allergies)    Past Medical History:  Diagnosis Date  . Arthritis   . Hyperlipidemia   . Peripheral vascular disease ([place])    MILD CAROTID DISEASE BY DOPPLER STUDY  11/13/12. -REPORT FROM [facility]  . Seasonal allergies     Past Surgical History:    Procedure Laterality Date  . BREAST SURGERY     BREAST BIOPSIES X 2  - NO CANCER  . congenitally absent uterus    . DOPPLER ECHOCARDIOGRAPHY    . LASER VOCAL CORD FOR NODULE  1985    . TOTAL HIP ARTHROPLASTY  11/29/2012   Procedure: TOTAL HIP ARTHROPLASTY ANTERIOR APPROACH;  Surgeon: Gearlean Alf, MD;  Location: [facility];  Service: Orthopedics;  Laterality: Left;    Family History  Problem Relation Age of Onset  . Arthritis Mother   . Heart failure Father   . Arthritis Sister   . Lung disease Neg Hx   . Rheumatologic disease Neg Hx     Social History   Socioeconomic History  . Marital status: Married    Spouse name: Not on file  . Number of children: 0  . Years of education: Not on file  . Highest education level: Not on file  Occupational History  . Occupation: retired  Scientific laboratory technician  . Financial resource strain: Not on file  . Food insecurity:    Worry: Not on file    Inability: Not on file  . Transportation needs:    Medical: Not on file    Non-medical: Not on file  Tobacco Use  . Smoking status: Never Smoker  . Smokeless tobacco: Never Used  Substance and Sexual Activity  . Alcohol use: Yes    Alcohol/week: 0.0 standard drinks    Comment: OCCASIONAL ALCOHOL  . Drug use: No  . Sexual activity: Not on file  Lifestyle  . Physical activity:    Days per week: Not on file    Minutes per session: Not on file  . Stress: Not on file  Relationships  . Social connections:    Talks on phone: Not on file    Gets together: Not on file    Attends religious service: Not on file    Active member of club or organization: Not on file    Attends meetings of clubs or organizations: Not on file    Relationship status: Not on file  . Intimate partner violence:    Fear of current or ex partner: Not on file    Emotionally abused: Not on file    Physically abused: Not on file    Forced sexual activity: Not on file  Other Topics Concern  . Not on file  Social History  Narrative   Originally from [place], [place]. She lived for 14 years in [place]. She does live in the summer in [place]. Previously lived in [place]. Has previous travel to [place], [place], & [place]. 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 She reports he did work with some rare exotic woods from [place] & [place]. She does report chemical exposure to the spray booth in his shop. No bird exposure. She does have a hot tub but uses it irregularly.     Review of systems: Review of Systems  Constitutional: Negative for fever and chills.  HENT: Negative.   Eyes: Negative for blurred vision.  Respiratory: as per HPI  Cardiovascular: Negative for chest pain and  palpitations.  Gastrointestinal: Negative for vomiting, diarrhea, blood per rectum. Genitourinary: Negative for dysuria, urgency, frequency and hematuria.  Musculoskeletal: Negative for myalgias, back pain and joint pain.  Skin: Negative for itching and rash.  Neurological: Negative for dizziness, tremors, focal weakness, seizures and loss of consciousness.  Endo/Heme/Allergies: Negative for environmental allergies.  Psychiatric/Behavioral: Negative for depression, suicidal ideas and hallucinations.  All other systems reviewed and are negative.  Physical Exam: Blood pressure (!) 142/70, pulse 75, height 5\' 7"  (1.702 m), weight 182 lb 6.4 oz (82.7 kg), SpO2 97 %. Gen:      No acute distress HEENT:  EOMI, sclera anicteric Neck:     No masses; no thyromegaly Lungs:    Clear to auscultation bilaterally; normal respiratory effort CV:         Regular rate and rhythm; no murmurs Abd:      + bowel sounds; soft, non-tender; no palpable masses, no distension Ext:    No edema; adequate peripheral perfusion Skin:      Warm and dry; no rash Neuro: alert and oriented x 3 Psych: normal mood and affect  Data Reviewed: Imaging: High-resolution 03/02/2016- Hilar lymphadenopathy, mosaic  attenuation with air trapping, interlobular septal thickening, upper lobe pulmonary nodules.  I have reviewed the images personally.   PFTs: 11/02/16: FVC 3.07 L (106%) FEV1 2.51 L (116%) FEV1/FVC 0.82 FEF 25-75 2.74 L (188%), DLCO corrected 69% (Hgb 15.3) 06/25/16: FVC 3.23 L (111%) FEV1 2.16 L (121%) FEV1/FVC 0.81 FEF 25-75 2.80 L (189%), DLCO corrected 62% (hemoglobin 16.2) 04/22/16: FVC 3.10 L (107%) FEV1 2.56 L (118%) FEV1/FVC 0.83 FEF 25-75 2.90 L (194%), DLCO uncorrected 62% 01/05/16: FVC 3.14 L (107%) FEV1 2.60 L (119%) FEV1/FVC 0.83 FEF 25-75 2.95 L (196%) no bronchodilator response TLC 5.87 L (106%) RV 102% ERV 81% DLCO uncorrected 65%  6MWT 11/02/16:  Walked 432 meters / Baseline Sat 98% on RA / Nadir Sat 98% on RA @ rest 06/25/16: Walked 357 meters / Baseline Sat 96% on RA / Nadir Sat 96% on RA 04/22/16:  Walked 357 meters / Baseline Sat 96% on RA / Nadir Sat 95% on RA  Labs: 04/22/16 ACE:  72  02/25/16 ANA:  Negative Anti-CCP:  <16 RF:  72 SSA:  6.9 SSB:  <1  Data from [place] May 2019 CBC-WBC 6.9, eos 2.5%, absolute eosinophil count 981, metabolic panel, LFTs within normal limits. Barium swallow 05/03/2018- mild to moderate oropharyngeal dysphagia which indicates increased risk for aspiration.  CT chest high-resolution 05/02/2018-partially calcified bilateral hilar and mediastinal adenopathy and upper lung perilymphatic nodularity compatible with sarcoidosis, small hiatal hernia. CT sinus 05/02/2018- minimal ethmoid sinusitis, question right mastoiditis.  Findings suggest rhinitis.  Echocardiogram 05/02/2018-suboptimal image quality, decreased LV cavity size, LVEF 55-60%, RV is moderately enlarged.  Normal RV systolic function and diastolic function.  RVSP is moderately elevated at 50mm, IVC is increased in size suggestive of mild increased right atrial pressure.  Assessment:  Sarcoidosis CT scan which I personally reviewed shows a pattern of calcified  lymphadenopathy, perilymphatic nodularity is consistent with sarcoid.  There is a question of connective tissue disease with positive SSA which is likely nonspecific We will continue to monitor with repeat high res CT, pulmonary function tests.  Discussed with the patient to make a definitive diagnosis will need to do a biopsy but since she is stable with no symptoms we have decided to monitor for now We will order a high-resolution CT, spirometry and diffusion capacity. She will need an  EKG and referral to ophthalmology screen at return visit.  Aspiration Swallow study noted with mild to moderate aspiration risk.  Stressed that she needs to follow aspiration precautions every day  Pulmonary hypertension Recent echo at [facility] shows moderate elevation in RV systolic pressure We will repeat an echo.  If there is persistent abnormality then we may need to consider right heart catheterization.  Plan/Recommendations: - High-resolution CT, spirometry, diffusion capacity, echocardiogram  Marshell Garfinkel MD [facility] 09/22/2018, 12:06 PM  CC: Marletta Lor, MD

## 2018-09-22 NOTE — Patient Instructions (Signed)
I am glad you are doing well with the breathing We will schedule you for high-resolution CT, spirometry, diffusion capacity and an echocardiogram for reevaluation of your lungs We will schedule these tests in November.  Follow-up in ILD clinic after tests for review.

## 2018-10-31 ENCOUNTER — Ambulatory Visit (INDEPENDENT_AMBULATORY_CARE_PROVIDER_SITE_OTHER)
Admission: RE | Admit: 2018-10-31 | Discharge: 2018-10-31 | Disposition: A | Discharge status: Home / Self Care | Payer: Medicare Other | Source: Ambulatory Visit | Attending: Pulmonary Disease | Admitting: Pulmonary Disease

## 2018-10-31 ENCOUNTER — Ambulatory Visit (HOSPITAL_COMMUNITY): Payer: Medicare Other | Attending: Cardiology

## 2018-10-31 ENCOUNTER — Other Ambulatory Visit: Payer: Self-pay

## 2018-10-31 DIAGNOSIS — J849 Interstitial pulmonary disease, unspecified: Secondary | ICD-10-CM

## 2018-11-06 ENCOUNTER — Telehealth: Payer: Self-pay | Admitting: Pulmonary Disease

## 2018-11-06 NOTE — Telephone Encounter (Signed)
Pt aware to arrive 30 min prior to OV time to complete ILD questionnaire.

## 2018-11-07 ENCOUNTER — Encounter: Payer: Self-pay | Admitting: Pulmonary Disease

## 2018-11-07 ENCOUNTER — Ambulatory Visit (INDEPENDENT_AMBULATORY_CARE_PROVIDER_SITE_OTHER): Payer: Medicare Other | Admitting: Pulmonary Disease

## 2018-11-07 ENCOUNTER — Other Ambulatory Visit: Payer: Self-pay | Admitting: Pulmonary Disease

## 2018-11-07 VITALS — BP 132/74 | HR 80 | Ht 67.0 in | Wt 173.0 lb

## 2018-11-07 DIAGNOSIS — I272 Pulmonary hypertension, unspecified: Secondary | ICD-10-CM

## 2018-11-07 DIAGNOSIS — J849 Interstitial pulmonary disease, unspecified: Secondary | ICD-10-CM | POA: Diagnosis not present

## 2018-11-07 DIAGNOSIS — D869 Sarcoidosis, unspecified: Secondary | ICD-10-CM | POA: Diagnosis not present

## 2018-11-07 LAB — PULMONARY FUNCTION TEST
DL/VA % pred: 88 %
DL/VA: 4.56 ml/min/mmHg/L
DLCO UNC: 22.39 ml/min/mmHg
DLCO unc % pred: 79 %
FEF 25-75 Pre: 2.62 L/sec
FEF2575-%PRED-PRE: 189 %
FEV1-%PRED-PRE: 121 %
FEV1-Pre: 2.55 L
FEV1FVC-%PRED-PRE: 110 %
FEV6-%Pred-Pre: 118 %
FEV6-Pre: 3.14 L
FEV6FVC-%Pred-Pre: 105 %
FVC-%Pred-Pre: 112 %
FVC-Pre: 3.16 L
PRE FEV1/FVC RATIO: 81 %
Pre FEV6/FVC Ratio: 99 %

## 2018-11-07 NOTE — Progress Notes (Signed)
PFT completed today. 11/07/18

## 2018-11-07 NOTE — Patient Instructions (Signed)
I have reviewed his CT scan and pulmonary function test which shows stable lung function.  This is good news I believe the sarcoid is inactive.  We will continue to monitor this  The echocardiogram shows mild to moderate pulmonary hypertension We will get a sleep study for further evaluation to make sure you do not have sleep apnea Follow-up in 3 to 4 months.

## 2018-11-07 NOTE — Progress Notes (Signed)
Mary Summers    [id number]    Feb 07, 1932  Primary Care Physician:Kwiatkowski, Doretha Sou, MD  Referring Physician: Marletta Lor, MD [place], [place] [id number]  Chief complaint: Follow-up for interstitial lung disease, sarcoidosis  HPI: Previously followed by Dr. Ashok Cordia at [facility] in 2017 with CT scan showing mediastinal adenopathy, calcification, lung nodule.  Initial work-up showing elevated ACE level, rheumatoid factor, SSA.  She is evaluated by Dr. Amil Amen, Rheumatology with no clinical evidence of connective tissue disease or Sjogren's  She is had an evaluation at [facility], [place] May 2019 with repeat high-resolution CT showing upper lobe nodules, calcified lymph nodes, PFTs with no obvious deficit, excellent exercise test.  Swallow eval showed mild to moderate aspiration with recommendations to minimize swallow dysfunction.  CT scanning of the sinuses showed mild sinusitis, rhinitis.  Echocardiogram showed normal LV function, elevated RVSP. Assessment from [place] was that ILD is consistent with sarcoidosis.  No need for treatment at present Follow lung function, chest imaging.  She will need work-up for pulmonary hypertension.  No symptoms today.  She has minimal dyspnea on exertion, minimal cough, nonproductive in nature.  Occasional joint pain, swelling, difficulty swallowing food, dry mouth, dry eyes.  Notes history of Zenker's diverticulum. She continues to maintain an active lifestyle.  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  She spends about half the year in [place], [place]  Pets: No pets, dogs, birds Occupation: Retired Arts administrator Smoking history: Never smoker  Travel history: Travel to [place]. Relevant family history: Significant family history of lung disease.  Comprehensive ILD questionnaire  09/22/2018-negative for environmental exposures, occupational exposures,  medications.  She used to own a feather pillow and comforter several years ago but she got rid of it.  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  Denies any dyspnea, cough, sputum production, wheezing.  Outpatient Encounter Medications as of 11/07/2018  Medication Sig  . aspirin 81 MG tablet Take 81 mg by mouth daily.  Marland Kitchen atorvastatin (LIPITOR) 40 MG tablet Take 1 tablet (40 mg total) by mouth daily.  . metoprolol succinate (TOPROL-XL) 50 MG 24 hr tablet Take 1 tablet (50 mg total) by mouth daily. Take with or immediately following a meal.  . Multiple Vitamin (MULTIVITAMIN) tablet Take 1 tablet by mouth daily.   . Ascorbic Acid (VITAMIN C PO) Take 1 tablet by mouth daily.  . beta carotene 30 MG capsule Take 30 mg by mouth daily.  . calcium gluconate 500 MG tablet Take 2 tablets by mouth daily.  . ciclopirox (PENLAC) 8 % solution Apply topically at bedtime. Apply over nail and surrounding skin. Apply daily over previous coat. After seven (7) days, may remove with alcohol and continue cycle. (Patient not taking: Reported on 11/07/2018)  . co-enzyme Q-10 30 MG capsule Take 30 mg by mouth daily.  . fluticasone (FLONASE) 50 MCG/ACT nasal spray Place 1 spray into both nostrils daily as needed for allergies or rhinitis. ONCE A DAY IF NEEDED (Patient not taking: Reported on 11/07/2018)  . glucosamine-chondroitin 500-400 MG tablet Take 2 tablets by mouth daily.  . Omega-3 Fatty Acids (OMEGA 3 PO) Take 1 capsule by mouth daily.  . SELENIUM PO Take 1 tablet by mouth daily.  . Turmeric 500 MG CAPS Take 1 tablet by mouth daily.  . vitamin E  400 UNIT capsule Take 400 Units by mouth daily.   No facility-administered encounter medications on file as of 11/07/2018.    Physical Exam: Blood pressure (!) 142/70, pulse 75, height 5\' 7"  (1.702 m), weight 182 lb 6.4 oz (82.7 kg), SpO2 97 %. Gen:      No acute  distress HEENT:  EOMI, sclera anicteric Neck:     No masses; no thyromegaly Lungs:    Clear to auscultation bilaterally; normal respiratory effort CV:         Regular rate and rhythm; no murmurs Abd:      + bowel sounds; soft, non-tender; no palpable masses, no distension Ext:    No edema; adequate peripheral perfusion Skin:      Warm and dry; no rash Neuro: alert and oriented x 3 Psych: normal mood and affect  Data Reviewed: Imaging: High-resolution CT 03/02/2016- Hilar lymphadenopathy, mosaic attenuation with air trapping, interlobular septal thickening, upper lobe pulmonary nodules.  High-resolution CT 10/31/2018- stable hilar lymphadenopathy, upper airway septal thickening, lung nodules I have reviewed the images personally.   PFTs: 11/02/16: FVC 3.07 L (106%) FEV1 2.51 L (116%) FEV1/FVC 0.82 FEF 25-75 2.74 L (188%), DLCO corrected 69% (Hgb 15.3) 06/25/16: FVC 3.23 L (111%) FEV1 2.16 L (121%) FEV1/FVC 0.81 FEF 25-75 2.80 L (189%), DLCO corrected 62% (hemoglobin 16.2) 04/22/16: FVC 3.10 L (107%) FEV1 2.56 L (118%) FEV1/FVC 0.83 FEF 25-75 2.90 L (194%), DLCO uncorrected 62% 01/05/16: FVC 3.14 L (107%) FEV1 2.60 L (119%) FEV1/FVC 0.83 FEF 25-75 2.95 L (196%) no bronchodilator response TLC 5.87 L (106%) RV 102% ERV 81% DLCO uncorrected 65%  6MWT 11/02/16:  Walked 432 meters / Baseline Sat 98% on RA / Nadir Sat 98% on RA @ rest 06/25/16: Walked 357 meters / Baseline Sat 96% on RA / Nadir Sat 96% on RA 04/22/16:  Walked 357 meters / Baseline Sat 96% on RA / Nadir Sat 95% on RA  Labs: 04/22/16 ACE:  72  02/25/16 ANA:  Negative Anti-CCP:  <16 RF:  72 SSA:  6.9 SSB:  <1  Cardiac Echocardiogram 57/84/[id number] LV systolic function with grade 2 diastolic dyfunction.   Mild-moderated increased PA systolic pressure. Mild TR.  Data from [facility] May 2019 CBC-WBC 6.9, eos 2.5%, absolute eosinophil count 324, metabolic panel, LFTs within normal limits. Barium swallow  05/03/2018- mild to moderate oropharyngeal dysphagia which indicates increased risk for aspiration.  CT chest high-resolution 05/02/2018-partially calcified bilateral hilar and mediastinal adenopathy and upper lung perilymphatic nodularity compatible with sarcoidosis, small hiatal hernia. CT sinus 05/02/2018- minimal ethmoid sinusitis, question right mastoiditis.  Findings suggest rhinitis.  Echocardiogram 05/02/2018-suboptimal image quality, decreased LV cavity size, LVEF 55-60%, RV is moderately enlarged.  Normal RV systolic function and diastolic function.  RVSP is moderately elevated at 24mm, IVC is increased in size suggestive of mild increased right atrial pressure.  Assessment:  Sarcoidosis CT scan which I personally reviewed shows a pattern of calcified lymphadenopathy, perilymphatic nodularity is consistent with sarcoid.  There is a question of connective tissue disease with positive SSA which is likely nonspecific  No need for treatment at present as she is asymptomatic. Follow-up high-resolution CT and pulmonary function test shows stable lung function. She has regular visits with ophthalmology with no evidence of ocular sarcoid Get EKG for baseline assessment.  Aspiration Swallow study noted with mild to moderate aspiration risk.  Follow aspiration precautions.  Pulmonary hypertension Repeat echo shows moderate pulmonary hypertension, may be secondary to sarcoid We will get split-night sleep  study for evaluation of sleep apnea given symptoms of snoring at night.  Plan/Recommendations: - EKG, split-night sleep study  Marshell Garfinkel MD [facility] 11/07/2018, 3:02 PM  CC: Marletta Lor, MD

## 2018-11-07 NOTE — Addendum Note (Signed)
Addended by: Maryanna Shape A on: 11/07/2018 03:32 PM   Modules accepted: Orders

## 2018-12-26 ENCOUNTER — Telehealth: Payer: Self-pay | Admitting: Pulmonary Disease

## 2018-12-26 ENCOUNTER — Ambulatory Visit (HOSPITAL_BASED_OUTPATIENT_CLINIC_OR_DEPARTMENT_OTHER): Payer: Medicare Other | Attending: Pulmonary Disease | Admitting: Pulmonary Disease

## 2018-12-26 VITALS — Ht 67.0 in | Wt 175.0 lb

## 2018-12-26 DIAGNOSIS — R0683 Snoring: Secondary | ICD-10-CM | POA: Insufficient documentation

## 2018-12-26 DIAGNOSIS — I272 Pulmonary hypertension, unspecified: Secondary | ICD-10-CM | POA: Diagnosis present

## 2018-12-26 NOTE — Telephone Encounter (Signed)
In the note, Dr. Vaughan Browner wanted a split night study but in the instructions it doesn't specify.  Dr. Vaughan Browner can you clarify please and I can place order. Thanks.  Assessment:  Sarcoidosis CT scan which I personally reviewed shows a pattern of calcified lymphadenopathy, perilymphatic nodularity is consistent with sarcoid.  There is a question of connective tissue disease with positive SSA which is likely nonspecific  No need for treatment at present as she is asymptomatic. Follow-up high-resolution CT and pulmonary function test shows stable lung function. She has regular visits with ophthalmology with no evidence of ocular sarcoid Get EKG for baseline assessment.  Aspiration Swallow study noted with mild to moderate aspiration risk.  Follow aspiration precautions.  Pulmonary hypertension Repeat echo shows moderate pulmonary hypertension, may be secondary to sarcoid We will get split-night sleep study for evaluation of sleep apnea given symptoms of snoring at night.   Patient Instructions by Marshell Garfinkel, MD at 11/07/2018 3:00 PM  Author: Marshell Garfinkel, MD Author Type: Physician Filed: 11/07/2018 3:26 PM  Note Status: Signed Cosign: Cosign Not Required Encounter Date: 11/07/2018  Editor: Marshell Garfinkel, MD (Physician)    I have reviewed his CT scan and pulmonary function test which shows stable lung function.  This is good news I believe the sarcoid is inactive.  We will continue to monitor this  The echocardiogram shows mild to moderate pulmonary hypertension We will get a sleep study for further evaluation to make sure you do not have sleep apnea Follow-up in 3 to 4 months.

## 2018-12-27 NOTE — Telephone Encounter (Signed)
Dr. Vaughan Browner,   Is it ok with you to change test from sleep study to split night study?

## 2018-12-28 NOTE — Telephone Encounter (Signed)
Called and spoke to Kia with [facility].  Kia states that pt has already had NPSG.   Will route to Dr. Vaughan Browner to make aware.

## 2018-12-28 NOTE — Telephone Encounter (Signed)
Yes. Ok to order split night sleep study

## 2019-01-01 ENCOUNTER — Encounter: Payer: Medicare Other | Admitting: Internal Medicine

## 2019-01-01 NOTE — Telephone Encounter (Signed)
Message previously routed to Dr. Mannam. 

## 2019-01-02 ENCOUNTER — Other Ambulatory Visit: Payer: Self-pay

## 2019-01-02 ENCOUNTER — Encounter: Payer: Self-pay | Admitting: Family Medicine

## 2019-01-02 ENCOUNTER — Ambulatory Visit (INDEPENDENT_AMBULATORY_CARE_PROVIDER_SITE_OTHER): Payer: Medicare Other | Admitting: Family Medicine

## 2019-01-02 VITALS — BP 120/70 | HR 88 | Temp 97.9°F | Ht 66.5 in | Wt 180.8 lb

## 2019-01-02 DIAGNOSIS — M160 Bilateral primary osteoarthritis of hip: Secondary | ICD-10-CM | POA: Diagnosis not present

## 2019-01-02 DIAGNOSIS — E7800 Pure hypercholesterolemia, unspecified: Secondary | ICD-10-CM

## 2019-01-02 DIAGNOSIS — Z7184 Encounter for health counseling related to travel: Secondary | ICD-10-CM

## 2019-01-02 DIAGNOSIS — Z23 Encounter for immunization: Secondary | ICD-10-CM

## 2019-01-02 DIAGNOSIS — I471 Supraventricular tachycardia: Secondary | ICD-10-CM | POA: Diagnosis not present

## 2019-01-02 DIAGNOSIS — J849 Interstitial pulmonary disease, unspecified: Secondary | ICD-10-CM | POA: Diagnosis not present

## 2019-01-02 NOTE — Patient Instructions (Signed)
Consider repeat Hep A vaccine in 6-7 months.

## 2019-01-02 NOTE — Progress Notes (Signed)
Subjective:     Patient ID: Mary Summers, female   DOB: [birth year], 85 y.o.   MRN: [id number]  HPI Patient is new to me but not new to this clinic.  She is seeing me in transfer of care.  She has seen Dr.K in the past chronic problems include history of PSVT, mild atherosclerosis of carotid arteries, interstitial lung disease, osteoarthritis of multiple joints, hyperlipidemia.  She is had previous left total hip replacement.  Said some recent issues with knee arthritis.  She has question of sarcoidosis.  Has responded prednisone in the past.  Followed closely by pulmonary.  Lung symptoms are stable.  No dyspnea.  No chronic cough.  Hyperlipidemia treated with Lipitor.  No myalgias.  Her last lipids were in April.  She sees cardiologist then.  She plans to get follow-up labs with them.  She has PSVT's currently controlled with metoprolol 50 mg daily.  No recent palpitations.  No chest pains.  She is still taking aspirin.  Recurrent travels around the world.  She has never had documented hepatitis a.  Also needs consideration for tetanus booster this year  Past Medical History:  Diagnosis Date  . Arthritis   . Hyperlipidemia   . Peripheral vascular disease ([place])    MILD CAROTID DISEASE BY DOPPLER STUDY  11/13/12. -REPORT FROM [facility]  . Seasonal allergies    Past Surgical History:  Procedure Laterality Date  . BREAST SURGERY     BREAST BIOPSIES X 2  - NO CANCER  . congenitally absent uterus    . DOPPLER ECHOCARDIOGRAPHY    . LASER VOCAL CORD FOR NODULE  1985    . TOTAL HIP ARTHROPLASTY  11/29/2012   Procedure: TOTAL HIP ARTHROPLASTY ANTERIOR APPROACH;  Surgeon: Gearlean Alf, MD;  Location: [facility];  Service: Orthopedics;  Laterality: Left;    reports that she has never smoked. She has never used smokeless tobacco. She reports current alcohol use. She reports that she does not use drugs. family history includes Arthritis in her mother and  sister; Heart failure in her father. No Known Allergies   Review of Systems  Constitutional: Negative for fatigue.  Eyes: Negative for visual disturbance.  Respiratory: Negative for cough, chest tightness, shortness of breath and wheezing.   Cardiovascular: Negative for chest pain, palpitations and leg swelling.  Endocrine: Negative for polydipsia and polyuria.  Musculoskeletal: Positive for arthralgias.  Neurological: Negative for dizziness, seizures, syncope, weakness, light-headedness and headaches.       Objective:   Physical Exam Constitutional:      Appearance: She is well-developed.  Eyes:     Pupils: Pupils are equal, round, and reactive to light.  Neck:     Musculoskeletal: Neck supple.     Thyroid: No thyromegaly.     Vascular: No JVD.  Cardiovascular:     Rate and Rhythm: Normal rate and regular rhythm.     Heart sounds: No gallop.   Pulmonary:     Effort: Pulmonary effort is normal. No respiratory distress.     Breath sounds: Normal breath sounds. No wheezing or rales.  Neurological:     Mental Status: She is alert.        Assessment:     #1 history of PSVT-controlled with metoprolol  #2 hyperlipidemia treated with Lipitor  #3 history of chronic interstitial lung disease.  Question sarcoidosis.  Overall stable  #4 frequent travel  #5 osteoarthritis involving multiple joints    Plan:     -  Recommend she consider hepatitis A vaccine and repeat in about 6 months -She will discuss with cardiologist their opinion regarding whether she should maintain aspirin use. -She will plan to get follow-up lipids around April when she sees cardiologist -Discussed getting possible follow-up DEXA scan later this year  Eulas Post MD [facility] at [facility]

## 2019-01-02 NOTE — Addendum Note (Signed)
Addended by: Sheffield Slider L on: 01/02/2019 11:30 AM   Modules accepted: Orders

## 2019-01-07 DIAGNOSIS — R0683 Snoring: Secondary | ICD-10-CM | POA: Diagnosis not present

## 2019-01-07 DIAGNOSIS — I272 Pulmonary hypertension, unspecified: Secondary | ICD-10-CM | POA: Diagnosis not present

## 2019-01-07 NOTE — Procedures (Signed)
    Patient Name: Mary Summers, [place] Date: 12/26/2018   Gender: Female  D.O.B: [birth year]  Age (years): 56  Referring Provider: Marshell Garfinkel  Height (inches): 23  Interpreting Physician: Chesley Mires MD, ABSM  Weight (lbs): 175  RPSGT: Rebekah Chesterfield  BMI: 27  MRN: [id number]  Neck Size: 15.00  <br> <br>  CLINICAL INFORMATION  Sleep Study Type: NPSG Indication for sleep study: Fatigue, snoring. History of sarcoidosis and pulmonary hypertension. Epworth Sleepiness Score: 3 SLEEP STUDY TECHNIQUE  As per the AASM Manual for the Scoring of Sleep and Associated Events v2.3 (April 2016) with a hypopnea requiring 4% desaturations. The channels recorded and monitored were frontal, central and occipital EEG, electrooculogram (EOG), submentalis EMG (chin), nasal and oral airflow, thoracic and abdominal wall motion, anterior tibialis EMG, snore microphone, electrocardiogram, and pulse oximetry. MEDICATIONS  Medications self-administered by patient taken the night of the study : N/A SLEEP ARCHITECTURE  The study was initiated at 11:18:20 PM and ended at 5:14:53 AM. Sleep onset time was 11.0 minutes and the sleep efficiency was 82.6%%. The total sleep time was 294.6 minutes. Stage REM latency was 77.0 minutes. The patient spent 8.3%% of the night in stage N1 sleep, 67.1%% in stage N2 sleep, 9.8%% in stage N3 and 14.8% in REM. Alpha intrusion was absent. Supine sleep was 0.00%. RESPIRATORY PARAMETERS  The overall apnea/hypopnea index (AHI) was 4.3 per hour. There were 14 total apneas, including 0 obstructive, 14 central and 0 mixed apneas. There were 7 hypopneas and 2 RERAs. The AHI during Stage REM sleep was 9.7 per hour. AHI while supine was N/A per hour. The mean oxygen saturation was 93.1%. The minimum SpO2 during sleep was 87.0%. moderate snoring was noted during this study. CARDIAC DATA  The 2 lead EKG demonstrated sinus rhythm. The mean heart rate was 78.9 beats per minute.  Other EKG findings include: None.  LEG MOVEMENT DATA  The total PLMS were 0 with a resulting PLMS index of 0.0. Associated arousal with leg movement index was 0.6 . IMPRESSIONS  - While she had several obstructive respiratory events, these were not frequent enough to qualify for a diagnosis of obstructive sleep apnea. Her overall AHI was 4.3 with an SpO2 low of 87%. She did not require supplemental oxygen during this study. DIAGNOSIS  - Snoring. RECOMMENDATIONS  - Avoid alcohol, sedatives and other CNS depressants that may worsen sleep apnea and disrupt normal sleep architecture.  - Sleep hygiene should be reviewed to assess factors that may improve sleep quality.  - Weight management and regular exercise should be initiated or continued if appropriate. [Electronically signed] 01/07/2019 08:34 PM Chesley Mires MD, [place], American Board of Sleep Medicine  NPI: [id number]

## 2019-01-08 ENCOUNTER — Telehealth: Payer: Self-pay

## 2019-01-08 NOTE — Telephone Encounter (Signed)
Called and spoke with patient.  Sleep study results given.  Understanding stated.  Nothing further at this time.  ----- Message from Marshell Garfinkel, MD sent at 01/08/2019  8:59 AM EST ----- Please let patient know sleep study does not show evidence of sleep apnea ----- Message ----- From: Chesley Mires, MD Sent: 01/07/2019   8:35 PM EST To: Marletta Lor, MD, Marshell Garfinkel, MD

## 2019-01-08 NOTE — Telephone Encounter (Signed)
lmtcb x1 for pt. 

## 2019-01-08 NOTE — Telephone Encounter (Signed)
-----   Message from Marshell Garfinkel, MD sent at 01/08/2019  8:59 AM EST ----- Please let patient know sleep study does not show evidence of sleep apnea ----- Message ----- From: Chesley Mires, MD Sent: 01/07/2019   8:35 PM EST To: Marletta Lor, MD, Marshell Garfinkel, MD

## 2019-01-08 NOTE — Telephone Encounter (Signed)
Pt is calling back [phone]

## 2019-01-10 NOTE — Telephone Encounter (Signed)
Patient has been made aware of sleep study results. Nothing further is needed with this message.

## 2019-02-14 ENCOUNTER — Emergency Department (HOSPITAL_COMMUNITY)
Admission: EM | Admit: 2019-02-14 | Discharge: 2019-02-14 | Disposition: A | Discharge status: Home / Self Care | Payer: Medicare Other | Attending: Emergency Medicine | Admitting: Emergency Medicine

## 2019-02-14 ENCOUNTER — Other Ambulatory Visit: Payer: Self-pay

## 2019-02-14 ENCOUNTER — Encounter (HOSPITAL_COMMUNITY): Payer: Self-pay | Admitting: Emergency Medicine

## 2019-02-14 DIAGNOSIS — R002 Palpitations: Secondary | ICD-10-CM | POA: Diagnosis present

## 2019-02-14 DIAGNOSIS — I471 Supraventricular tachycardia: Secondary | ICD-10-CM | POA: Diagnosis not present

## 2019-02-14 LAB — CBC WITH DIFFERENTIAL/PLATELET
Abs Immature Granulocytes: 0.02 10*3/uL (ref 0.00–0.07)
Basophils Absolute: 0.1 10*3/uL (ref 0.0–0.1)
Basophils Relative: 1 %
Eosinophils Absolute: 0.1 10*3/uL (ref 0.0–0.5)
Eosinophils Relative: 2 %
HCT: 46.5 % — ABNORMAL HIGH (ref 36.0–46.0)
Hemoglobin: 14.5 g/dL (ref 12.0–15.0)
Immature Granulocytes: 0 %
Lymphocytes Relative: 22 %
Lymphs Abs: 1.7 10*3/uL (ref 0.7–4.0)
MCH: 29.2 pg (ref 26.0–34.0)
MCHC: 31.2 g/dL (ref 30.0–36.0)
MCV: 93.6 fL (ref 80.0–100.0)
Monocytes Absolute: 0.4 10*3/uL (ref 0.1–1.0)
Monocytes Relative: 6 %
Neutro Abs: 5.3 10*3/uL (ref 1.7–7.7)
Neutrophils Relative %: 69 %
Platelets: 188 10*3/uL (ref 150–400)
RBC: 4.97 MIL/uL (ref 3.87–5.11)
RDW: 11.9 % (ref 11.5–15.5)
WBC: 7.6 10*3/uL (ref 4.0–10.5)
nRBC: 0 % (ref 0.0–0.2)

## 2019-02-14 LAB — BASIC METABOLIC PANEL
Anion gap: 13 (ref 5–15)
BUN: 18 mg/dL (ref 8–23)
CO2: 23 mmol/L (ref 22–32)
Calcium: 9.5 mg/dL (ref 8.9–10.3)
Chloride: 103 mmol/L (ref 98–111)
Creatinine, Ser: 1.11 mg/dL — ABNORMAL HIGH (ref 0.44–1.00)
GFR calc Af Amer: 53 mL/min — ABNORMAL LOW (ref >60)
GFR calc non Af Amer: 46 mL/min — ABNORMAL LOW (ref >60)
Glucose, Bld: 191 mg/dL — ABNORMAL HIGH (ref 70–99)
Potassium: 4.6 mmol/L (ref 3.5–5.1)
SODIUM: 139 mmol/L (ref 135–145)

## 2019-02-14 LAB — MAGNESIUM: MAGNESIUM: 2.4 mg/dL (ref 1.7–2.4)

## 2019-02-14 LAB — TSH: TSH: 4.845 u[IU]/mL — ABNORMAL HIGH (ref 0.350–4.500)

## 2019-02-14 MED ORDER — ADENOSINE 6 MG/2ML IV SOLN
INTRAVENOUS | Status: AC
  Filled 2019-02-14: qty 4

## 2019-02-14 MED ORDER — ADENOSINE 6 MG/2ML IV SOLN
6.0000 mg | Freq: Once | INTRAVENOUS | Status: AC
  Administered 2019-02-14: 6 mg via INTRAVENOUS

## 2019-02-14 MED ORDER — ETOMIDATE 2 MG/ML IV SOLN
10.0000 mg | Freq: Once | INTRAVENOUS | Status: DC
  Filled 2019-02-14: qty 10

## 2019-02-14 MED ORDER — ADENOSINE 6 MG/2ML IV SOLN
12.0000 mg | Freq: Once | INTRAVENOUS | Status: AC
  Administered 2019-02-14: 12 mg via INTRAVENOUS

## 2019-02-14 MED ORDER — SODIUM CHLORIDE 0.9 % IV BOLUS
500.0000 mL | Freq: Once | INTRAVENOUS | Status: AC
  Administered 2019-02-14: 500 mL via INTRAVENOUS

## 2019-02-14 MED ORDER — ADENOSINE 6 MG/2ML IV SOLN
INTRAVENOUS | Status: AC
  Filled 2019-02-14: qty 2

## 2019-02-14 MED ORDER — METOPROLOL TARTRATE 5 MG/5ML IV SOLN
5.0000 mg | Freq: Once | INTRAVENOUS | Status: AC
  Administered 2019-02-14: 5 mg via INTRAVENOUS
  Filled 2019-02-14: qty 5

## 2019-02-14 NOTE — ED Triage Notes (Addendum)
Pt arrives to ED from home with complaints of atrial flutter vs SVT. Pt reports that she was bringing boxes upstairs in her house when she suddenly had n/v, had a warm feeling come over her body, sever fatigue and  palpations. Pt stated that she attempted to vagal at home by lifting her feet above her head while laying down she stated that her symptoms resolved. Thirty minutes after resolution pt stated her symptoms came back. Pt HR 200's on arrival.

## 2019-02-14 NOTE — ED Notes (Signed)
Patient verbalizes understanding of discharge instructions. Opportunity for questioning and answers were provided. Armband removed by staff, pt discharged from ED.  

## 2019-02-14 NOTE — ED Provider Notes (Signed)
Emergency Department Provider Note   I have reviewed the triage vital signs and the nursing notes.   HISTORY  Chief Complaint Atrial Flutter   HPI Mary Summers is a 85 y.o. female with PMH of HLD and SVT presents to the emergency department for evaluation of heart palpitations.  No chest pain.  Patient has history of SVT and takes metoprolol daily.  She is followed by Dr. Debara Pickett.  She had a brief episode of heart palpitations earlier today which resolved when she performed vagal maneuvers at home.  Approximately 35 minutes prior to ED presentation the patient again felt SVT that could not break.  She took an additional oral metoprolol dose at home and presented to the emergency department.  She has any lightheadedness or fatigue.  At home she did have some diaphoresis.  No new medications.   Past Medical History:  Diagnosis Date  . Arthritis   . Hyperlipidemia   . Peripheral vascular disease ([place])    MILD CAROTID DISEASE BY DOPPLER STUDY  11/13/12. -REPORT FROM [facility]  . Seasonal allergies     Patient Active Problem List   Diagnosis Date Noted  . PSVT (paroxysmal supraventricular tachycardia) ([place]) 04/16/2018  . Pure hypercholesterolemia 03/18/2017  . Interstitial lung disease ([place]) 12/24/2016  . Chronic seasonal allergic rhinitis 11/02/2016  . Arthritis 02/25/2016  . DOE (dyspnea on exertion) 12/30/2015  . Peripheral vascular disease ([place]) 11/22/2014  . Mild atherosclerosis of carotid artery 11/22/2014  . OA (osteoarthritis) of hip 11/29/2012  . Hip pain 11/21/2011  . Hyperlipidemia 01/17/2009    Past Surgical History:  Procedure Laterality Date  . BREAST SURGERY     BREAST BIOPSIES X 2  - NO CANCER  . congenitally absent uterus    . DOPPLER ECHOCARDIOGRAPHY    . LASER VOCAL CORD FOR NODULE  1985    . TOTAL HIP ARTHROPLASTY  11/29/2012   Procedure: TOTAL HIP ARTHROPLASTY ANTERIOR APPROACH;  Surgeon: Gearlean Alf, MD;   Location: [facility];  Service: Orthopedics;  Laterality: Left;   Allergies Patient has no known allergies.  Family History  Problem Relation Age of Onset  . Arthritis Mother   . Heart failure Father   . Arthritis Sister   . Lung disease Neg Hx   . Rheumatologic disease Neg Hx     Social History Social History   Tobacco Use  . Smoking status: Never Smoker  . Smokeless tobacco: Never Used  Substance Use Topics  . Alcohol use: Yes    Alcohol/week: 0.0 standard drinks    Comment: OCCASIONAL ALCOHOL  . Drug use: No    Review of Systems  Constitutional: No fever/chills Eyes: No visual changes. ENT: No sore throat. Cardiovascular: Denies chest pain. Positive palpitations.  Respiratory: Denies shortness of breath. Gastrointestinal: No abdominal pain.  No nausea, no vomiting.  No diarrhea.  No constipation. Genitourinary: Negative for dysuria. Musculoskeletal: Negative for back pain. Skin: Negative for rash. Neurological: Negative for headaches, focal weakness or numbness.  10-point ROS otherwise negative.  ____________________________________________   PHYSICAL EXAM:  VITAL SIGNS: ED Triage Vitals [02/14/19 1511]  Enc Vitals Group     BP (!) 74/55     Pulse Rate (!) 212     Resp (!) 22     Temp 98.2 F (36.8 C)     Temp Source Oral     SpO2 98 %     Weight 180 lb (81.6 kg)     Height  5\' 7"  (1.702 m)   Constitutional: Alert and oriented. Well appearing and in no acute distress. Eyes: Conjunctivae are normal.  Head: Atraumatic. Nose: No congestion/rhinnorhea. Mouth/Throat: Mucous membranes are moist.  Neck: No stridor.  Cardiovascular: SVT with rate of 200. Good peripheral circulation. Grossly normal heart sounds.   Respiratory: Normal respiratory effort.  No retractions. Lungs CTAB. Gastrointestinal: Soft and nontender. No distention.  Musculoskeletal: No lower extremity tenderness nor edema. No gross deformities of extremities. Neurologic:  Normal speech and  language. No gross focal neurologic deficits are appreciated.  Skin:  Skin is warm, dry and intact. No rash noted.  ____________________________________________   LABS (all labs ordered are listed, but only abnormal results are displayed)  Labs Reviewed  BASIC METABOLIC PANEL - Abnormal; Notable for the following components:      Result Value   Glucose, Bld 191 (*)    Creatinine, Ser 1.11 (*)    GFR calc non Af Amer 46 (*)    GFR calc Af Amer 53 (*)    All other components within normal limits  CBC WITH DIFFERENTIAL/PLATELET - Abnormal; Notable for the following components:   HCT 46.5 (*)    All other components within normal limits  TSH - Abnormal; Notable for the following components:   TSH 4.845 (*)    All other components within normal limits  MAGNESIUM   ____________________________________________  EKG   EKG Interpretation  Date/Time:  Wednesday February 14 2019 15:49:47 EST Ventricular Rate:  110 PR Interval:    QRS Duration: 89 QT Interval:  309 QTC Calculation: 418 R Axis:   41 Text Interpretation:  Sinus tachycardia Non-specific ST changes.  No STEMI.  Reconfirmed by Nanda Quinton [phone]) on 02/14/2019 4:31:36 PM      ____________________________________________   PROCEDURES  Procedure(s) performed:   Procedures  CRITICAL CARE Performed by: Margette Fast Total critical care time: 35 minutes Critical care time was exclusive of separately billable procedures and treating other patients. Critical care was necessary to treat or prevent imminent or life-threatening deterioration. Critical care was time spent personally by me on the following activities: development of treatment plan with patient and/or surrogate as well as nursing, discussions with consultants, evaluation of patient's response to treatment, examination of patient, obtaining history from patient or surrogate, ordering and performing treatments and interventions, ordering and review of  laboratory studies, ordering and review of radiographic studies, pulse oximetry and re-evaluation of patient's condition.  Nanda Quinton, MD Emergency Medicine  ____________________________________________   INITIAL IMPRESSION / ASSESSMENT AND PLAN / ED COURSE  Pertinent labs & imaging results that were available during my care of the patient were reviewed by me and considered in my medical decision making (see chart for details).  Patient arrives with SVT and rate of 200.  Blood pressures are soft but patient remains awake and alert.  P blood pressure when she is brought to the acute care area in the low 932I systolic and so elected to try adenosine.  No response with 6 mg.  12 mg pause to the rhythm which showed underlying flutter waves then return to rate of 200.   03:50 PM Spoke with Dr. Lovena Le with cardiology.  Appears to be underlying flutter wave after 12 mg of adenosine.  Heart rate returned to 200.  Agrees with plan to electrically cardiovert.  While setting up for the procedure and obtaining verbal consent from the patient she spontaneously converted to normal sinus rhythm.  Blood pressure improved.  EKG with no  acute ischemic changes after rate reduced.  Will give IV metoprolol here with some residual, sinus tachycardia and obtain labs.  Patient is not experiencing chest pain or shortness of breath.  Do not plan to order troponin.   06:30 PM Patient with no return to SVT rhythm.  Feeling comfortable.  Mild tachycardia here but no hypotension.  Plan to make the medication changes as discussed with Dr. Lovena Le and have the patient follow-up as an outpatient.  She is comfortable with the plan at discharge.  Her lab work shows elevated TSH but presentation is not consistent with hypothyroid.  Advised that she discuss with her PCP for further outpatient testing.  No electrolyte abnormalities. Patient and husband comfortable with the plan at discharge.    ____________________________________________  FINAL CLINICAL IMPRESSION(S) / ED DIAGNOSES  Final diagnoses:  SVT (supraventricular tachycardia) (HCC)     MEDICATIONS GIVEN DURING THIS VISIT:  Medications  adenosine (ADENOCARD) 6 MG/2ML injection (has no administration in time range)  adenosine (ADENOCARD) 6 MG/2ML injection (has no administration in time range)  etomidate (AMIDATE) injection 10 mg (10 mg Intravenous Not Given 02/14/19 1558)  adenosine (ADENOCARD) 6 MG/2ML injection 6 mg (6 mg Intravenous Given 02/14/19 1531)  adenosine (ADENOCARD) 6 MG/2ML injection 12 mg (12 mg Intravenous Given 02/14/19 1536)  metoprolol tartrate (LOPRESSOR) injection 5 mg (5 mg Intravenous Given 02/14/19 1617)  sodium chloride 0.9 % bolus 500 mL (500 mLs Intravenous New Bag/Given 02/14/19 1530)    Note:  This document was prepared using Dragon voice recognition software and may include unintentional dictation errors.  Nanda Quinton, MD Emergency Medicine    Abdulraheem Pineo, [facility], MD 02/14/19 [phone]

## 2019-02-14 NOTE — Discharge Instructions (Signed)
You were seen in the ED today with a rapid heart rate. This corrected on its own. Your TSH, which is a thyroid lab, was slightly abnormal. Your PCP will need to add some additional tests from the office. Call to discuss with them tomorrow.   Call the Cardiologist listed to discuss your SVT rhythm and possible treatment options.   For now, take your normal dose of Metoprolol in the morning and take an additional 1/2 tablet (25 mg) in the evenings.   Return to the ED with any new or worsening symptoms.

## 2019-02-19 ENCOUNTER — Other Ambulatory Visit: Payer: Self-pay | Admitting: Internal Medicine

## 2019-02-20 ENCOUNTER — Other Ambulatory Visit: Payer: Self-pay

## 2019-02-20 ENCOUNTER — Telehealth: Payer: Self-pay | Admitting: Internal Medicine

## 2019-02-20 ENCOUNTER — Telehealth: Payer: Self-pay | Admitting: *Deleted

## 2019-02-20 ENCOUNTER — Encounter: Payer: Self-pay | Admitting: Family Medicine

## 2019-02-20 ENCOUNTER — Ambulatory Visit (INDEPENDENT_AMBULATORY_CARE_PROVIDER_SITE_OTHER): Payer: Medicare Other | Admitting: Family Medicine

## 2019-02-20 VITALS — BP 122/72 | HR 98 | Temp 98.1°F | Ht 66.5 in | Wt 184.8 lb

## 2019-02-20 DIAGNOSIS — R000 Tachycardia, unspecified: Secondary | ICD-10-CM | POA: Diagnosis not present

## 2019-02-20 MED ORDER — METOPROLOL SUCCINATE ER 50 MG PO TB24: ORAL_TABLET | ORAL | 3 refills | Status: DC

## 2019-02-20 NOTE — Telephone Encounter (Signed)
ok 

## 2019-02-20 NOTE — Progress Notes (Addendum)
Subjective:     Patient ID: Mary Summers, female   DOB: 05/21/1932, 85 y.o.   MRN: [id number]  HPI Patient is seen for ER follow-up.  She has history of PSVT and at baseline was taking Toprol-XL 50 mg daily.  She is followed by cardiology.  She had noted some palpitations on the day of ER visit which was 02/14/2019.  She had done some vagal maneuvers and had reverted at home until about 35 to 40 minutes prior to ED presentation when she had recurrent SVT that would not break with home maneuvers.  She had some fatigue and lightheadedness.  No syncope.  No chest pain.  Labs significant for slightly elevated TSH.  Electrolytes were stable.  Patient received adenosine 12 mg and seemed to have some underlying flutter waves.  There had been plans to consider electrically cardioverting but during the process of setting up for that she spontaneously converted to sinus rhythm.  She was given some IV metoprolol in ED.  Her home metoprolol dose was increased to 50 mg in the morning and 25 mg at night.  She still has some fatigue issues.  No dyspnea.  No chest pain.  She has follow-up with cardiology next week.  Minimal caffeine use.  Past Medical History:  Diagnosis Date  . Arthritis   . Hyperlipidemia   . Peripheral vascular disease ([place])    MILD CAROTID DISEASE BY DOPPLER STUDY  11/13/12. -REPORT FROM [facility]  . Seasonal allergies    Past Surgical History:  Procedure Laterality Date  . BREAST SURGERY     BREAST BIOPSIES X 2  - NO CANCER  . congenitally absent uterus    . DOPPLER ECHOCARDIOGRAPHY    . LASER VOCAL CORD FOR NODULE  1985    . TOTAL HIP ARTHROPLASTY  11/29/2012   Procedure: TOTAL HIP ARTHROPLASTY ANTERIOR APPROACH;  Surgeon: Gearlean Alf, MD;  Location: [facility];  Service: Orthopedics;  Laterality: Left;    reports that she has never smoked. She has never used smokeless tobacco. She reports current alcohol use. She reports that she does not use  drugs. family history includes Arthritis in her mother and sister; Heart failure in her father. No Known Allergies  Review of Systems  Constitutional: Positive for fatigue. Negative for chills and fever.  Eyes: Negative for visual disturbance.  Respiratory: Negative for cough, chest tightness, shortness of breath and wheezing.   Cardiovascular: Positive for palpitations. Negative for chest pain and leg swelling.  Neurological: Negative for dizziness, seizures, syncope, weakness, light-headedness and headaches.       Objective:   Physical Exam Constitutional:      Appearance: Normal appearance.  Cardiovascular:     Comments: Tachy rhythm with palpated rate around 120 Pulmonary:     Effort: Pulmonary effort is normal.     Breath sounds: Normal breath sounds.  Musculoskeletal:     Right lower leg: No edema.     Left lower leg: No edema.  Neurological:     Mental Status: She is alert.        Assessment:     #1 history of PSVT- with recent conversion in ED following Adenosine.  #2 appears to be in ? A fib/flutter by EKG at this time. CHA2DS2VASc score would be 3.  #3 mildly elevated TSH.  No overt symptoms of hypothyroidism.    Plan:     -increase her Metoprolol to 50 mg bid - will discuss with cardiology to  see if her appt can be moved up. -she is tolerating fairly well at this time other than some fatigue issues.  She knows to go to ER for any chest pain, dizziness, or other concerns.  -we discussed possible anti-coagulation.  Will discuss with cardiology.  Eulas Post MD [facility]

## 2019-02-20 NOTE — Patient Instructions (Signed)
Atrial Fibrillation Atrial fibrillation is a type of irregular or rapid heartbeat (arrhythmia). In atrial fibrillation, the top part of the heart (atria) quivers in a chaotic pattern. This makes the heart unable to pump blood normally. Having atrial fibrillation can increase your risk for other health problems, such as:  Blood can pool in the atria and form clots. If a clot travels to the brain, it can cause a stroke.  The heart muscle may weaken from the irregular blood flow. This can cause heart failure. Atrial fibrillation may start suddenly and stop on its own, or it may become a long-lasting problem. What are the causes? This condition is caused by some heart-related conditions or procedures, including:  High blood pressure. This is the most common cause.  Heart failure.  Heart valve conditions.  Inflammation of the sac that surrounds the heart (pericarditis).  Heart surgery.  Coronary artery disease.  Certain heart rhythm disorders, such as Wolf-Parkinson-White syndrome. Other causes include:  Pneumonia.  Obstructive sleep apnea.  Lung cancer.  Thyroid problems, especially if the thyroid is overactive (hyperthyroidism).  Excessive alcohol or drug use. Sometimes, the cause of this condition is not known. What increases the risk? This condition is more likely to develop in:  Older people.  People who smoke.  People who have diabetes mellitus.  People who are overweight (obese).  Athletes who exercise vigorously.  People who have a family history. What are the signs or symptoms? Symptoms of this condition include:  A feeling that your heart is beating rapidly or irregularly.  A feeling of discomfort or pain in your chest.  Shortness of breath.  Sudden light-headedness or weakness.  Getting tired easily during exercise. In some cases, there are no symptoms. How is this diagnosed? Your health care provider may be able to detect atrial fibrillation when  taking your pulse. If detected, this condition may be diagnosed with:  Electrocardiogram (ECG).  Ambulatory cardiac monitor. This device records your heartbeats for 24 hours or more.  Transthoracic echocardiogram (TTE) to evaluate how blood flows through your heart.  Transesophageal echocardiogram (TEE) to view more detailed images of your heart.  A stress test.  Imaging tests, such as a CT scan or chest X-ray.  Blood tests. How is this treated? This condition may be treated with:  Medicines to slow down the heart rate or bring the heart's rhythm back to normal.  Medicines to prevent blood clots from forming.  Electrical cardioversion. This delivers a low-energy shock to the heart to reset its rhythm.  Ablation. This procedure destroys the part of the heart tissue that sends abnormal signals.  Left atrial appendage occlusion/excision. This seals off a common place in the atria where blood clots can form (left atrial appendage). The goal of treatment is to prevent blood clots from forming and to keep your heart beating at a normal rate and rhythm. Treatment depends on underlying medical conditions and how you feel when you are experiencing fibrillation. Follow these instructions at home: Medicines  Take over-the counter and prescription medicines only as told by your health care provider.  If your health care provider prescribed a blood-thinning medicine (anticoagulant), take it exactly as told. Taking too much blood-thinning medicine can cause bleeding. Taking too little can enable a blood clot to form and travel to the brain, causing a stroke. Lifestyle      Do not use any products that contain nicotine or tobacco, such as cigarettes and e-cigarettes. If you need help quitting, ask your health   care provider.  Do not drink beverages that contain caffeine, such as coffee, soda, and tea.  Follow diet instructions as told by your health care provider.  Exercise regularly as  told by your health care provider.  Do not drink alcohol. General instructions  If you have obstructive sleep apnea, manage your condition as told by your health care provider.  Maintain a healthy weight. Do not use diet pills unless your health care provider approves. Diet pills may make heart problems worse.  Keep all follow-up visits as told by your health care provider. This is important. Contact a health care provider if you:  Notice a change in the rate, rhythm, or strength of your heartbeat.  Are taking an anticoagulant and you notice increased bruising.  Tire more easily when you exercise or exert yourself.  Have a sudden change in weight. Get help right away if you have:   Chest pain, abdominal pain, sweating, or weakness.  Difficulty breathing.  Blood in your vomit, stool (feces), or urine.  Any symptoms of a stroke. "BE FAST" is an easy way to remember the main warning signs of a stroke: ? B - Balance. Signs are dizziness, sudden trouble walking, or loss of balance. ? E - Eyes. Signs are trouble seeing or a sudden change in vision. ? F - Face. Signs are sudden weakness or numbness of the face, or the face or eyelid drooping on one side. ? A - Arms. Signs are weakness or numbness in an arm. This happens suddenly and usually on one side of the body. ? S - Speech. Signs are sudden trouble speaking, slurred speech, or trouble understanding what people say. ? T - Time. Time to call emergency services. Write down what time symptoms started.  Other signs of a stroke, such as: ? A sudden, severe headache with no known cause. ? Nausea or vomiting. ? Seizure. These symptoms may represent a serious problem that is an emergency. Do not wait to see if the symptoms will go away. Get medical help right away. Call your local emergency services (911 in the [place] Do not drive yourself to the hospital. Summary  Atrial fibrillation is a type of irregular or rapid heartbeat  (arrhythmia).  Symptoms include a feeling that your heart is beating fast or irregularly. In some cases, you may not have symptoms.  The condition is treated with medicines to slow down the heart rate or bring the heart's rhythm back to normal. You may also need blood-thinning medicines to prevent blood clots.  Get help right away if you have symptoms or signs of a stroke. This information is not intended to replace advice given to you by your health care provider. Make sure you discuss any questions you have with your health care provider. Document Released: 12/06/2005 Document Revised: 01/27/2018 Document Reviewed: 01/27/2018 Elsevier Interactive Patient Education  2019 [employer].  

## 2019-02-20 NOTE — Telephone Encounter (Signed)
Pt returned call. No one at office / please advise   Dr. Lovena Le also returned call and stated he would try to get Pt scheduled in his office this week/ please advise

## 2019-02-20 NOTE — Telephone Encounter (Signed)
Called patient and left a detailed voice message to let her know that Dr. Elease Hashimoto said the choir rehearsal is OK for her to do.

## 2019-02-20 NOTE — Telephone Encounter (Signed)
Please advise 

## 2019-02-20 NOTE — Telephone Encounter (Signed)
Copied from [place] [id number]. Topic: General - Inquiry >> Feb 20, 2019  3:39 PM Virl Axe D wrote: Reason for CRM: Pt stated that Dr. Elease Hashimoto told her to do as little as possible. She has committed to doing choir rehearsal one day a week for about 1 1/2 hours. She would like to know if this is okay to do. Please advise as it starts tonight.

## 2019-02-20 NOTE — Telephone Encounter (Signed)
New message   Dr. Elease Hashimoto would like a call to discuss this patient.

## 2019-02-21 ENCOUNTER — Telehealth: Payer: Self-pay

## 2019-02-21 ENCOUNTER — Encounter: Payer: Self-pay | Admitting: Family Medicine

## 2019-02-21 NOTE — Telephone Encounter (Signed)
This has already been sent in another message.  Copied from [place] [id number]. Topic: General - Other >> Feb 20, 2019  5:12 PM Alanda Slim E wrote: Reason for CRM: Dr, Cristopher Peru called to speak with Dr. Elease Hashimoto and stated he would try to see the patient in his office this week/ please advise

## 2019-02-21 NOTE — Telephone Encounter (Signed)
FYI

## 2019-02-22 ENCOUNTER — Ambulatory Visit (INDEPENDENT_AMBULATORY_CARE_PROVIDER_SITE_OTHER): Payer: Medicare Other | Admitting: Internal Medicine

## 2019-02-22 ENCOUNTER — Encounter: Payer: Self-pay | Admitting: Internal Medicine

## 2019-02-22 VITALS — BP 110/68 | HR 150 | Ht 67.0 in | Wt 184.4 lb

## 2019-02-22 DIAGNOSIS — I471 Supraventricular tachycardia: Secondary | ICD-10-CM | POA: Diagnosis not present

## 2019-02-22 NOTE — Progress Notes (Signed)
HPI [name] returns today for evaluation of SVT. She is a pleasant 85 yo woman who has had worsening palpitations over the past 2 years. She has had documented SVT (probably atrial tachycardia) with HR's upto 220/min. At other times she has had SVT at 150. Despite her heart racing she appears to be tolerating these episodes fairly well. No syncope but she does feel sob and has some chest pressure. She has failed medical therapy with beta blockers. No Known Allergies   Current Outpatient Medications  Medication Sig Dispense Refill  . Ascorbic Acid (VITAMIN C PO) Take 1 tablet by mouth daily.    Marland Kitchen aspirin EC 81 MG tablet Take 81 mg by mouth daily with breakfast.    . atorvastatin (LIPITOR) 40 MG tablet TAKE 1 TABLET BY MOUTH EVERY DAY 90 tablet 1  . beta carotene 30 MG capsule Take 30 mg by mouth daily.    . calcium gluconate 500 MG tablet Take 2 tablets by mouth daily.    . ciclopirox (PENLAC) 8 % solution Apply topically at bedtime. Apply over nail and surrounding skin. Apply daily over previous coat. After seven (7) days, may remove with alcohol and continue cycle. 6.6 mL 0  . co-enzyme Q-10 30 MG capsule Take 30 mg by mouth daily.    . fluticasone (FLONASE) 50 MCG/ACT nasal spray Place 1 spray into both nostrils daily as needed for allergies or rhinitis. ONCE A DAY IF NEEDED 16 g 2  . glucosamine-chondroitin 500-400 MG tablet Take 1 tablet by mouth daily.     . metoprolol succinate (TOPROL-XL) 50 MG 24 hr tablet Take one by mouth twice daily. 180 tablet 3  . Multiple Vitamin (MULTIVITAMIN) tablet Take 1 tablet by mouth daily.     . naproxen sodium (ALEVE) 220 MG tablet Take 220 mg by mouth daily as needed (pain).    . Omega-3 Fatty Acids (OMEGA 3 PO) Take 1 capsule by mouth daily.    . SELENIUM PO Take 1 tablet by mouth daily.    . Turmeric 500 MG CAPS Take 1 tablet by mouth daily.    . vitamin E 400 UNIT capsule Take 400 Units by mouth daily.     No current  facility-administered medications for this visit.      Past Medical History:  Diagnosis Date  . Arthritis   . Hyperlipidemia   . Peripheral vascular disease ([place])    MILD CAROTID DISEASE BY DOPPLER STUDY  11/13/12. -REPORT FROM [facility]  . Seasonal allergies     ROS:   All systems reviewed and negative except as noted in the HPI.   Past Surgical History:  Procedure Laterality Date  . BREAST SURGERY     BREAST BIOPSIES X 2  - NO CANCER  . congenitally absent uterus    . DOPPLER ECHOCARDIOGRAPHY    . LASER VOCAL CORD FOR NODULE  1985    . TOTAL HIP ARTHROPLASTY  11/29/2012   Procedure: TOTAL HIP ARTHROPLASTY ANTERIOR APPROACH;  Surgeon: Gearlean Alf, MD;  Location: [facility];  Service: Orthopedics;  Laterality: Left;     Family History  Problem Relation Age of Onset  . Arthritis Mother   . Heart failure Father   . Arthritis Sister   . Lung disease Neg Hx   . Rheumatologic disease Neg Hx      Social History   Socioeconomic History  . Marital status: Married    Spouse name: Not  on file  . Number of children: 0  . Years of education: Not on file  . Highest education level: Not on file  Occupational History  . Occupation: retired  Scientific laboratory technician  . Financial resource strain: Not on file  . Food insecurity:    Worry: Not on file    Inability: Not on file  . Transportation needs:    Medical: Not on file    Non-medical: Not on file  Tobacco Use  . Smoking status: Never Smoker  . Smokeless tobacco: Never Used  Substance and Sexual Activity  . Alcohol use: Yes    Alcohol/week: 0.0 standard drinks    Comment: OCCASIONAL ALCOHOL  . Drug use: No  . Sexual activity: Not on file  Lifestyle  . Physical activity:    Days per week: Not on file    Minutes per session: Not on file  . Stress: Not on file  Relationships  . Social connections:    Talks on phone: Not on file    Gets together: Not on file    Attends religious  service: Not on file    Active member of club or organization: Not on file    Attends meetings of clubs or organizations: Not on file    Relationship status: Not on file  . Intimate partner violence:    Fear of current or ex partner: Not on file    Emotionally abused: Not on file    Physically abused: Not on file    Forced sexual activity: Not on file  Other Topics Concern  . Not on file  Social History Narrative   Originally from [place], [place]. She lived for 14 years in [place]. She does live in the summer in [place]. Previously lived in [place]. Has previous travel to [place], [place], & [place]. 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 She reports he did work with some rare exotic woods from [place] & [place]. She does report chemical exposure to the spray booth in his shop. No bird exposure. She does have a hot tub but uses it irregularly.      BP 110/68   Pulse (!) 150   Ht 5\' 7"  (1.702 m)   Wt 184 lb 6.4 oz (83.6 kg)   SpO2 99%   BMI 28.88 kg/m   Physical Exam:  Well appearing NAD HEENT: Unremarkable Neck:  No JVD, no thyromegally Lymphatics:  No adenopathy Back:  No CVA tenderness Lungs:  Clear with no wheezes HEART:  Regular rate rhythm, no murmurs, no rubs, no clicks Abd:  soft, positive bowel sounds, no organomegally, no rebound, no guarding Ext:  2 plus pulses, no edema, no cyanosis, no clubbing Skin:  No rashes no nodules Neuro:  CN II through XII intact, motor grossly intact  EKG - reviewed   Assess/Plan: 1. SVT - I think she has atrial tachycardia. I have discussed the treatment options with the patient and her husband. I have recommended EP study and catheter ablation. If she has multiple foci, then AV node ablation and PPM would be recommended. 2. Carotide atherosclerosis - stable for now. Continue current meds.  Mikle Bosworth.D.

## 2019-02-22 NOTE — H&P (View-Only) (Signed)
HPI [name] returns today for evaluation of SVT. She is a pleasant 85 yo woman who has had worsening palpitations over the past 2 years. She has had documented SVT (probably atrial tachycardia) with HR's upto 220/min. At other times she has had SVT at 150. Despite her heart racing she appears to be tolerating these episodes fairly well. No syncope but she does feel sob and has some chest pressure. She has failed medical therapy with beta blockers. No Known Allergies   Current Outpatient Medications  Medication Sig Dispense Refill  . Ascorbic Acid (VITAMIN C PO) Take 1 tablet by mouth daily.    Marland Kitchen aspirin EC 81 MG tablet Take 81 mg by mouth daily with breakfast.    . atorvastatin (LIPITOR) 40 MG tablet TAKE 1 TABLET BY MOUTH EVERY DAY 90 tablet 1  . beta carotene 30 MG capsule Take 30 mg by mouth daily.    . calcium gluconate 500 MG tablet Take 2 tablets by mouth daily.    . ciclopirox (PENLAC) 8 % solution Apply topically at bedtime. Apply over nail and surrounding skin. Apply daily over previous coat. After seven (7) days, may remove with alcohol and continue cycle. 6.6 mL 0  . co-enzyme Q-10 30 MG capsule Take 30 mg by mouth daily.    . fluticasone (FLONASE) 50 MCG/ACT nasal spray Place 1 spray into both nostrils daily as needed for allergies or rhinitis. ONCE A DAY IF NEEDED 16 g 2  . glucosamine-chondroitin 500-400 MG tablet Take 1 tablet by mouth daily.     . metoprolol succinate (TOPROL-XL) 50 MG 24 hr tablet Take one by mouth twice daily. 180 tablet 3  . Multiple Vitamin (MULTIVITAMIN) tablet Take 1 tablet by mouth daily.     . naproxen sodium (ALEVE) 220 MG tablet Take 220 mg by mouth daily as needed (pain).    . Omega-3 Fatty Acids (OMEGA 3 PO) Take 1 capsule by mouth daily.    . SELENIUM PO Take 1 tablet by mouth daily.    . Turmeric 500 MG CAPS Take 1 tablet by mouth daily.    . vitamin E 400 UNIT capsule Take 400 Units by mouth daily.     No current  facility-administered medications for this visit.      Past Medical History:  Diagnosis Date  . Arthritis   . Hyperlipidemia   . Peripheral vascular disease ([place])    MILD CAROTID DISEASE BY DOPPLER STUDY  11/13/12. -REPORT FROM [facility]  . Seasonal allergies     ROS:   All systems reviewed and negative except as noted in the HPI.   Past Surgical History:  Procedure Laterality Date  . BREAST SURGERY     BREAST BIOPSIES X 2  - NO CANCER  . congenitally absent uterus    . DOPPLER ECHOCARDIOGRAPHY    . LASER VOCAL CORD FOR NODULE  1985    . TOTAL HIP ARTHROPLASTY  11/29/2012   Procedure: TOTAL HIP ARTHROPLASTY ANTERIOR APPROACH;  Surgeon: Gearlean Alf, MD;  Location: [facility];  Service: Orthopedics;  Laterality: Left;     Family History  Problem Relation Age of Onset  . Arthritis Mother   . Heart failure Father   . Arthritis Sister   . Lung disease Neg Hx   . Rheumatologic disease Neg Hx      Social History   Socioeconomic History  . Marital status: Married    Spouse name: Not  on file  . Number of children: 0  . Years of education: Not on file  . Highest education level: Not on file  Occupational History  . Occupation: retired  Scientific laboratory technician  . Financial resource strain: Not on file  . Food insecurity:    Worry: Not on file    Inability: Not on file  . Transportation needs:    Medical: Not on file    Non-medical: Not on file  Tobacco Use  . Smoking status: Never Smoker  . Smokeless tobacco: Never Used  Substance and Sexual Activity  . Alcohol use: Yes    Alcohol/week: 0.0 standard drinks    Comment: OCCASIONAL ALCOHOL  . Drug use: No  . Sexual activity: Not on file  Lifestyle  . Physical activity:    Days per week: Not on file    Minutes per session: Not on file  . Stress: Not on file  Relationships  . Social connections:    Talks on phone: Not on file    Gets together: Not on file    Attends religious  service: Not on file    Active member of club or organization: Not on file    Attends meetings of clubs or organizations: Not on file    Relationship status: Not on file  . Intimate partner violence:    Fear of current or ex partner: Not on file    Emotionally abused: Not on file    Physically abused: Not on file    Forced sexual activity: Not on file  Other Topics Concern  . Not on file  Social History Narrative   Originally from [place], [place]. She lived for 14 years in [place]. She does live in the summer in [place]. Previously lived in [place]. Has previous travel to [place], [place], & [place]. 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 She reports he did work with some rare exotic woods from [place]. She does report chemical exposure to the spray booth in his shop. No bird exposure. She does have a hot tub but uses it irregularly.      BP 110/68   Pulse (!) 150   Ht 5\' 7"  (1.702 m)   Wt 184 lb 6.4 oz (83.6 kg)   SpO2 99%   BMI 28.88 kg/m   Physical Exam:  Well appearing NAD HEENT: Unremarkable Neck:  No JVD, no thyromegally Lymphatics:  No adenopathy Back:  No CVA tenderness Lungs:  Clear with no wheezes HEART:  Regular rate rhythm, no murmurs, no rubs, no clicks Abd:  soft, positive bowel sounds, no organomegally, no rebound, no guarding Ext:  2 plus pulses, no edema, no cyanosis, no clubbing Skin:  No rashes no nodules Neuro:  CN II through XII intact, motor grossly intact  EKG - reviewed   Assess/Plan: 1. SVT - I think she has atrial tachycardia. I have discussed the treatment options with the patient and her husband. I have recommended EP study and catheter ablation. If she has multiple foci, then AV node ablation and PPM would be recommended. 2. Carotide atherosclerosis - stable for now. Continue current meds.  Mikle Bosworth.D.

## 2019-02-22 NOTE — Patient Instructions (Addendum)
Medication Instructions:  Your physician recommends that you continue on your current medications as directed. Please refer to the Current Medication list given to you today.  Labwork: None ordered.  Testing/Procedures: Your physician has recommended that you have an ablation. Catheter ablation is a medical procedure used to treat some cardiac arrhythmias (irregular heartbeats). During catheter ablation, a long, thin, flexible tube is put into a blood vessel in your groin (upper thigh), or neck. This tube is called an ablation catheter. It is then guided to your heart through the blood vessel. Radio frequency waves destroy small areas of heart tissue where abnormal heartbeats may cause an arrhythmia to start. Please see the instruction sheet given to you today.  Follow-Up:  You will follow up with Dr. Lovena Summers 4 weeks after your procedure.   ABLATION INSTRUCTIONS:  Please arrive to ADMITTING down the hall from the [place] main entrance A of [facility] hospital at:  10:30 am on February 28, 2019  Do not eat or drink after midnight prior to procedure  Do NOT take any medications on the morning of your procedure.  Plan to stay overnight.  You will need someone to drive you home at discharge  If you need a refill on your cardiac medications before your next appointment, please call your pharmacy.    Cardiac Ablation Cardiac ablation is a procedure to disable (ablate) a small amount of heart tissue in very specific places. The heart has many electrical connections. Sometimes these connections are abnormal and can cause the heart to beat very fast or irregularly. Ablating some of the problem areas can improve the heart rhythm or return it to normal. Ablation may be done for people who:  Have Wolff-Parkinson-White syndrome.  Have fast heart rhythms (tachycardia).  Have taken medicines for an abnormal heart rhythm (arrhythmia) that were not effective or caused side effects.  Have a  high-risk heartbeat that may be life-threatening. During the procedure, a small incision is made in the neck or the groin, and a long, thin, flexible tube (catheter) is inserted into the incision and moved to the heart. Small devices (electrodes) on the tip of the catheter will send out electrical currents. A type of X-ray (fluoroscopy) will be used to help guide the catheter and to provide images of the heart. Tell a health care provider about:  Any allergies you have.  All medicines you are taking, including vitamins, herbs, eye drops, creams, and over-the-counter medicines.  Any problems you or family members have had with anesthetic medicines.  Any blood disorders you have.  Any surgeries you have had.  Any medical conditions you have, such as kidney failure.  Whether you are pregnant or may be pregnant. What are the risks? Generally, this is a safe procedure. However, problems may occur, including:  Infection.  Bruising and bleeding at the catheter insertion site.  Bleeding into the chest, especially into the sac that surrounds the heart. This is a serious complication.  Stroke or blood clots.  Damage to other structures or organs.  Allergic reaction to medicines or dyes.  Need for a permanent pacemaker if the normal electrical system is damaged. A pacemaker is a small computer that sends electrical signals to the heart and helps your heart beat normally.  The procedure not being fully effective. This may not be recognized until months later. Repeat ablation procedures are sometimes required. What happens before the procedure?  Follow instructions from your health care provider about eating or drinking restrictions.  Ask  your health care provider about: ? Changing or stopping your regular medicines. This is especially important if you are taking diabetes medicines or blood thinners. ? Taking medicines such as aspirin and ibuprofen. These medicines can thin your blood. Do  not take these medicines before your procedure if your health care provider instructs you not to.  Plan to have someone take you home from the hospital or clinic.  If you will be going home right after the procedure, plan to have someone with you for 24 hours. What happens during the procedure?  To lower your risk of infection: ? Your health care team will wash or sanitize their hands. ? Your skin will be washed with soap. ? Hair may be removed from the incision area.  An IV tube will be inserted into one of your veins.  You will be given a medicine to help you relax (sedative).  The skin on your neck or groin will be numbed.  An incision will be made in your neck or your groin.  A needle will be inserted through the incision and into a large vein in your neck or groin.  A catheter will be inserted into the needle and moved to your heart.  Dye may be injected through the catheter to help your surgeon see the area of the heart that needs treatment.  Electrical currents will be sent from the catheter to ablate heart tissue in desired areas. There are three types of energy that may be used to ablate heart tissue: ? Heat (radiofrequency energy). ? Laser energy. ? Extreme cold (cryoablation).  When the necessary tissue has been ablated, the catheter will be removed.  Pressure will be held on the catheter insertion area to prevent excessive bleeding.  A bandage (dressing) will be placed over the catheter insertion area. The procedure may vary among health care providers and hospitals. What happens after the procedure?  Your blood pressure, heart rate, breathing rate, and blood oxygen level will be monitored until the medicines you were given have worn off.  Your catheter insertion area will be monitored for bleeding. You will need to lie still for a few hours to ensure that you do not bleed from the catheter insertion area.  Do not drive for 24 hours or as long as directed by  your health care provider. Summary  Cardiac ablation is a procedure to disable (ablate) a small amount of heart tissue in very specific places. Ablating some of the problem areas can improve the heart rhythm or return it to normal.  During the procedure, electrical currents will be sent from the catheter to ablate heart tissue in desired areas. This information is not intended to replace advice given to you by your health care provider. Make sure you discuss any questions you have with your health care provider. Document Released: 04/24/2009 Document Revised: 10/25/2016 Document Reviewed: 10/25/2016 Elsevier Interactive Patient Education  2019 [place].    Pacemaker Implantation, Adult Pacemaker implantation is a procedure to place a pacemaker inside your chest. A pacemaker is a small computer that sends electrical signals to the heart and helps your heart beat normally. A pacemaker also stores information about your heart rhythms. You may need pacemaker implantation if you:  Have a slow heartbeat (bradycardia).  Faint (syncope).  Have shortness of breath (dyspnea) due to heart problems. The pacemaker attaches to your heart through a wire, called a lead. Sometimes just one lead is needed. Other times, there will be two leads. There are  two types of pacemakers:  Transvenous pacemaker. This type is placed under the skin or muscle of your chest. The lead goes through a vein in the chest area to reach the inside of the heart.  Epicardial pacemaker. This type is placed under the skin or muscle of your chest or belly. The lead goes through your chest to the outside of the heart. Tell a health care provider about:  Any allergies you have.  All medicines you are taking, including vitamins, herbs, eye drops, creams, and over-the-counter medicines.  Any problems you or family members have had with anesthetic medicines.  Any blood or bone disorders you have.  Any surgeries you have  had.  Any medical conditions you have.  Whether you are pregnant or may be pregnant. What are the risks? Generally, this is a safe procedure. However, problems may occur, including:  Infection.  Bleeding.  Failure of the pacemaker or the lead.  Collapse of a lung or bleeding into a lung.  Blood clot inside a blood vessel with a lead.  Damage to the heart.  Infection inside the heart (endocarditis).  Allergic reactions to medicines. What happens before the procedure? Staying hydrated Follow instructions from your health care provider about hydration, which may include:  Up to 2 hours before the procedure - you may continue to drink clear liquids, such as water, clear fruit juice, black coffee, and plain tea. Eating and drinking restrictions Follow instructions from your health care provider about eating and drinking, which may include:  8 hours before the procedure - stop eating heavy meals or foods such as meat, fried foods, or fatty foods.  6 hours before the procedure - stop eating light meals or foods, such as toast or cereal.  6 hours before the procedure - stop drinking milk or drinks that contain milk.  2 hours before the procedure - stop drinking clear liquids. Medicines  Ask your health care provider about: ? Changing or stopping your regular medicines. This is especially important if you are taking diabetes medicines or blood thinners. ? Taking medicines such as aspirin and ibuprofen. These medicines can thin your blood. Do not take these medicines before your procedure if your health care provider instructs you not to.  You may be given antibiotic medicine to help prevent infection. General instructions  You will have a heart evaluation. This may include an electrocardiogram (ECG), chest X-ray, and heart imaging (echocardiogram,  or echo) tests.  You will have blood tests.  Do not use any products that contain nicotine or tobacco, such as cigarettes and  e-cigarettes. If you need help quitting, ask your health care provider.  Plan to have someone take you home from the hospital or clinic.  If you will be going home right after the procedure, plan to have someone with you for 24 hours.  Ask your health care provider how your surgical site will be marked or identified. What happens during the procedure?  To reduce your risk of infection: ? Your health care team will wash or sanitize their hands. ? Your skin will be washed with soap. ? Hair may be removed from the surgical area.  An IV tube will be inserted into one of your veins.  You will be given one or more of the following: ? A medicine to help you relax (sedative). ? A medicine to numb the area (local anesthetic). ? A medicine to make you fall asleep (general anesthetic).  If you are getting a transvenous pacemaker: ?  An incision will be made in your upper chest. ? A pocket will be made for the pacemaker. It may be placed under the skin or between layers of muscle. ? The lead will be inserted into a blood vessel that returns to the heart. ? While X-rays are taken by an imaging machine (fluoroscopy), the lead will be advanced through the vein to the inside of your heart. ? The other end of the lead will be tunneled under the skin and attached to the pacemaker.  If you are getting an epicardial pacemaker: ? An incision will be made near your ribs or breastbone (sternum) for the lead. ? The lead will be attached to the outside of your heart. ? Another incision will be made in your chest or upper belly to create a pocket for the pacemaker. ? The free end of the lead will be tunneled under the skin and attached to the pacemaker.  The transvenous or epicardial pacemaker will be tested. Imaging studies may be done to check the lead position.  The incisions will be closed with stitches (sutures), adhesive strips, or skin glue.  Bandages (dressing) will be placed over the  incisions. The procedure may vary among health care providers and hospitals. What happens after the procedure?  Your blood pressure, heart rate, breathing rate, and blood oxygen level will be monitored until the medicines you were given have worn off.  You will be given antibiotics and pain medicine.  ECG and chest x-rays will be done.  You will wear a continuous type of ECG (Holter monitor) to check your heart rhythm.  Your health care provider will program the pacemaker.  Do not drive for 24 hours if you received a sedative. This information is not intended to replace advice given to you by your health care provider. Make sure you discuss any questions you have with your health care provider. Document Released: 11/26/2002 Document Revised: 08/25/2018 Document Reviewed: 05/19/2016 Elsevier Interactive Patient Education  2019 [employer].

## 2019-02-23 ENCOUNTER — Telehealth: Payer: Self-pay

## 2019-02-23 ENCOUNTER — Encounter: Payer: Self-pay | Admitting: Family Medicine

## 2019-02-23 NOTE — Telephone Encounter (Signed)
I spoke to the patient and reviewed her possible medication administration during her ablation.  She verbalized understanding and was thankful for the call.

## 2019-02-28 ENCOUNTER — Other Ambulatory Visit: Payer: Self-pay

## 2019-02-28 ENCOUNTER — Encounter (HOSPITAL_COMMUNITY)
Admission: RE | Disposition: A | Discharge status: Home / Self Care | Payer: Self-pay | Source: Home / Self Care | Attending: Internal Medicine

## 2019-02-28 ENCOUNTER — Ambulatory Visit (HOSPITAL_COMMUNITY)
Admission: RE | Admit: 2019-02-28 | Discharge: 2019-03-01 | Disposition: A | Discharge status: Home / Self Care | Payer: Medicare Other | Attending: Internal Medicine | Admitting: Internal Medicine

## 2019-02-28 DIAGNOSIS — Z7982 Long term (current) use of aspirin: Secondary | ICD-10-CM | POA: Insufficient documentation

## 2019-02-28 DIAGNOSIS — I4892 Unspecified atrial flutter: Secondary | ICD-10-CM | POA: Diagnosis present

## 2019-02-28 DIAGNOSIS — Z8249 Family history of ischemic heart disease and other diseases of the circulatory system: Secondary | ICD-10-CM | POA: Insufficient documentation

## 2019-02-28 DIAGNOSIS — I471 Supraventricular tachycardia, unspecified: Secondary | ICD-10-CM | POA: Diagnosis present

## 2019-02-28 DIAGNOSIS — E785 Hyperlipidemia, unspecified: Secondary | ICD-10-CM | POA: Diagnosis not present

## 2019-02-28 DIAGNOSIS — I483 Typical atrial flutter: Secondary | ICD-10-CM

## 2019-02-28 DIAGNOSIS — R0602 Shortness of breath: Secondary | ICD-10-CM | POA: Insufficient documentation

## 2019-02-28 DIAGNOSIS — I739 Peripheral vascular disease, unspecified: Secondary | ICD-10-CM | POA: Insufficient documentation

## 2019-02-28 DIAGNOSIS — Z79899 Other long term (current) drug therapy: Secondary | ICD-10-CM | POA: Diagnosis not present

## 2019-02-28 DIAGNOSIS — M1990 Unspecified osteoarthritis, unspecified site: Secondary | ICD-10-CM | POA: Diagnosis not present

## 2019-02-28 HISTORY — PX: ATRIAL TACH ABLATION: EP1192

## 2019-02-28 LAB — SURGICAL PCR SCREEN
MRSA, PCR: NEGATIVE
Staphylococcus aureus: NEGATIVE

## 2019-02-28 SURGERY — ATRIAL TACH ABLATION
Anesthesia: LOCAL

## 2019-02-28 MED ORDER — SODIUM CHLORIDE 0.9% FLUSH
3.0000 mL | Freq: Two times a day (BID) | INTRAVENOUS | Status: DC
  Administered 2019-02-28 – 2019-03-01 (×2): 3 mL via INTRAVENOUS

## 2019-02-28 MED ORDER — MIDAZOLAM HCL 5 MG/5ML IJ SOLN
INTRAMUSCULAR | Status: AC
  Filled 2019-02-28: qty 5

## 2019-02-28 MED ORDER — SODIUM CHLORIDE 0.9 % IV SOLN: 250.0000 mL | INTRAVENOUS | Status: DC | PRN

## 2019-02-28 MED ORDER — SODIUM CHLORIDE 0.9% FLUSH: 3.0000 mL | INTRAVENOUS | Status: DC | PRN

## 2019-02-28 MED ORDER — ONDANSETRON HCL 4 MG/2ML IJ SOLN: 4.0000 mg | Freq: Four times a day (QID) | INTRAMUSCULAR | Status: DC | PRN

## 2019-02-28 MED ORDER — METOPROLOL SUCCINATE ER 50 MG PO TB24
50.0000 mg | ORAL_TABLET | Freq: Every day | ORAL | Status: DC
  Administered 2019-03-01: 50 mg via ORAL
  Filled 2019-02-28: qty 1

## 2019-02-28 MED ORDER — SODIUM CHLORIDE 0.9 % IV SOLN: 80.0000 mg | INTRAVENOUS | Status: DC

## 2019-02-28 MED ORDER — MUPIROCIN 2 % EX OINT
1.0000 "application " | TOPICAL_OINTMENT | Freq: Once | CUTANEOUS | Status: AC
  Administered 2019-02-28: 1 via TOPICAL
  Filled 2019-02-28: qty 22

## 2019-02-28 MED ORDER — HEPARIN (PORCINE) IN NACL 1000-0.9 UT/500ML-% IV SOLN
INTRAVENOUS | Status: DC | PRN
  Administered 2019-02-28: 500 mL

## 2019-02-28 MED ORDER — BUPIVACAINE HCL (PF) 0.25 % IJ SOLN
INTRAMUSCULAR | Status: AC
  Filled 2019-02-28: qty 60

## 2019-02-28 MED ORDER — HEPARIN SODIUM (PORCINE) 1000 UNIT/ML IJ SOLN
INTRAMUSCULAR | Status: DC | PRN
  Administered 2019-02-28: 1000 [IU] via INTRAVENOUS

## 2019-02-28 MED ORDER — FENTANYL CITRATE (PF) 100 MCG/2ML IJ SOLN
INTRAMUSCULAR | Status: AC
  Filled 2019-02-28: qty 2

## 2019-02-28 MED ORDER — FENTANYL CITRATE (PF) 100 MCG/2ML IJ SOLN
INTRAMUSCULAR | Status: DC | PRN
  Administered 2019-02-28 (×2): 25 ug via INTRAVENOUS
  Administered 2019-02-28 (×2): 12.5 ug via INTRAVENOUS
  Administered 2019-02-28: 25 ug via INTRAVENOUS
  Administered 2019-02-28 (×3): 12.5 ug via INTRAVENOUS
  Administered 2019-02-28: 25 ug via INTRAVENOUS
  Administered 2019-02-28 (×2): 12.5 ug via INTRAVENOUS

## 2019-02-28 MED ORDER — ACETAMINOPHEN 325 MG PO TABS
650.0000 mg | ORAL_TABLET | ORAL | Status: DC | PRN
  Administered 2019-03-01: 650 mg via ORAL
  Filled 2019-02-28: qty 2

## 2019-02-28 MED ORDER — MIDAZOLAM HCL 5 MG/5ML IJ SOLN
INTRAMUSCULAR | Status: DC | PRN
  Administered 2019-02-28 (×4): 1 mg via INTRAVENOUS
  Administered 2019-02-28: 2 mg via INTRAVENOUS
  Administered 2019-02-28: 1 mg via INTRAVENOUS
  Administered 2019-02-28: 2 mg via INTRAVENOUS
  Administered 2019-02-28: 1 mg via INTRAVENOUS
  Administered 2019-02-28: 2 mg via INTRAVENOUS
  Administered 2019-02-28 (×3): 1 mg via INTRAVENOUS

## 2019-02-28 MED ORDER — HEPARIN SODIUM (PORCINE) 1000 UNIT/ML IJ SOLN
INTRAMUSCULAR | Status: AC
  Filled 2019-02-28: qty 1

## 2019-02-28 MED ORDER — SODIUM CHLORIDE 0.9 % IV SOLN
INTRAVENOUS | Status: DC
  Administered 2019-02-28: 12:00:00 via INTRAVENOUS

## 2019-02-28 MED ORDER — CEFAZOLIN SODIUM-DEXTROSE 2-4 GM/100ML-% IV SOLN: 2.0000 g | INTRAVENOUS | Status: DC

## 2019-02-28 MED ORDER — CHLORHEXIDINE GLUCONATE 4 % EX LIQD
60.0000 mL | Freq: Once | CUTANEOUS | Status: DC
  Filled 2019-02-28: qty 60

## 2019-02-28 MED ORDER — HEPARIN (PORCINE) IN NACL 1000-0.9 UT/500ML-% IV SOLN
INTRAVENOUS | Status: AC
  Filled 2019-02-28: qty 500

## 2019-02-28 MED ORDER — MUPIROCIN 2 % EX OINT
TOPICAL_OINTMENT | CUTANEOUS | Status: AC
  Administered 2019-02-28: 1 via TOPICAL
  Filled 2019-02-28: qty 22

## 2019-02-28 MED ORDER — BUPIVACAINE HCL (PF) 0.25 % IJ SOLN
INTRAMUSCULAR | Status: DC | PRN
  Administered 2019-02-28: 40 mL

## 2019-02-28 SURGICAL SUPPLY — 12 items
CATH EZ STEER NAV 8MM F-J CUR (ABLATOR) ×1 IMPLANT
CATH JOSEPH QUAD ALLRED 6F REP (CATHETERS) ×2 IMPLANT
CATH POLARIS X 2.5/5/2.5 DECAP (CATHETERS) ×1 IMPLANT
CATH SMTCH THERMOCOOL SF DF (CATHETERS) ×1 IMPLANT
PACK EP LATEX FREE (CUSTOM PROCEDURE TRAY) ×2
PACK EP LF (CUSTOM PROCEDURE TRAY) ×1 IMPLANT
PAD PRO RADIOLUCENT 2001M-C (PAD) ×2 IMPLANT
PATCH CARTO3 (PAD) ×1 IMPLANT
SHEATH PINNACLE 6F 10CM (SHEATH) ×2 IMPLANT
SHEATH PINNACLE 7F 10CM (SHEATH) ×1 IMPLANT
SHEATH PINNACLE 8F 10CM (SHEATH) ×1 IMPLANT
TUBING SMART ABLATE COOLFLOW (TUBING) ×1 IMPLANT

## 2019-02-28 NOTE — Progress Notes (Signed)
When removing a heparin bottle from the pyxis during the case, it was noted that the fentanyl which had been removed at the beginning of the case was not decremented in the pyxis on the patient's case log.  The fentanyl was withdrawn with all employees present prior to the case start.  Cath Lab supervisor, Macon Large was notified and was to advise pharmacy of the issue.

## 2019-02-28 NOTE — Progress Notes (Addendum)
59f, 42f, 42f venous sheath removed from right groin by Suella Broad RN.  Manual pressure held for 20 mins.  Groin level 0 prior to sheath pull.  Groin level 0 post sheath pull.  Clean, dry dressing applied to site. Patient tolerated well.  Care instructions given to patient.  Bed rest start time: 1700

## 2019-02-28 NOTE — Interval H&P Note (Signed)
History and Physical Interval Note:  02/28/2019 1:02 PM  Mary Summers  has presented today for surgery, with the diagnosis of atrial tach.  The various methods of treatment have been discussed with the patient and family. After consideration of risks, benefits and other options for treatment, the patient has consented to  Procedure(s): ATRIAL TACH ABLATION (N/A) as a surgical intervention.  The patient's history has been reviewed, patient examined, no change in status, stable for surgery.  I have reviewed the patient's chart and labs.  Questions were answered to the patient's satisfaction.     Cristopher Peru

## 2019-03-01 ENCOUNTER — Encounter (HOSPITAL_COMMUNITY): Payer: Self-pay | Admitting: Internal Medicine

## 2019-03-01 DIAGNOSIS — I739 Peripheral vascular disease, unspecified: Secondary | ICD-10-CM | POA: Diagnosis not present

## 2019-03-01 DIAGNOSIS — I471 Supraventricular tachycardia: Secondary | ICD-10-CM | POA: Diagnosis not present

## 2019-03-01 DIAGNOSIS — E785 Hyperlipidemia, unspecified: Secondary | ICD-10-CM | POA: Diagnosis not present

## 2019-03-01 DIAGNOSIS — I483 Typical atrial flutter: Secondary | ICD-10-CM | POA: Diagnosis not present

## 2019-03-01 MED ORDER — RIVAROXABAN 15 MG PO TABS: 15.0000 mg | ORAL_TABLET | Freq: Every day | ORAL | Status: DC

## 2019-03-01 NOTE — Discharge Instructions (Signed)
Post procedure care instructions No driving for 4 days. No lifting over 5 lbs for 1 week. No vigorous or sexual activity for 1 week. You may return to work on 03/07/2019. Keep procedure site clean & dry. If you notice increased pain, swelling, bleeding or pus, call/return!  You may shower, but no soaking baths/hot tubs/pools for 1 week.

## 2019-03-01 NOTE — Discharge Summary (Addendum)
ELECTROPHYSIOLOGY PROCEDURE DISCHARGE SUMMARY    Patient ID: Mary Summers,  MRN: [id number], DOB/AGE: [id number] 83 y.o.  Admit date: 02/28/2019 Discharge date: 03/01/2019  Primary Care Physician: Eulas Post, MD  Primary Cardiologist: Dr. Debara Pickett Electrophysiologist: Dr. Lovena Le  Primary Discharge Diagnosis:  1. Atrial flutter status post ablation this admission     CHA2DS2Vasc is 3 (age/gender)     Calc CrCl is 49  Secondary Discharge Diagnosis:  1. HLD  No Known Allergies   Procedures This Admission: 1.  Electrophysiology study and radiofrequency catheter ablation on 02/28/2019 by Dr Lovena Le.   This study demonstrated CONCLUSIONS:  1. Inducible Isthmus-dependent right atrial flutter.  2. Successful radiofrequency ablation of atrial flutter along the cavotricuspid isthmus with complete bidirectional isthmus block achieved.  3. No inducible arrhythmias following ablation.  4. No early apparent complications    Brief HPI: Mary Summers is a 85 y.o. female with a past medical history as outlined above.  She had h/o SVT of unclear etilogy, suspect to have been an AT and planned for EPS/ablation.  Risks, benefits, and alternatives to ablation were reviewed with the patient who wished to proceed.   Hospital Course:  The patient was admitted and underwent EPS/RFCA with details as outlined above, noting typical AFlutter alone. She was monitored on telemetry overnight which demonstrated SR.  Procedure site is stable this morning without complication.  She feels well, no CP or SOB, the patient was examined by Dr Lovena Le who considered her stable for discharge to home.  Follow up will be arranged in 4 weeks.  Wound care and restrictions were reviewed with the patient prior to discharge.   The patient has been set aside Xarelto samples at the office for her 3 week regime.  Instructed to go by today She does not have HTN, will have her reducethe Toprol to once daily for 3  days and discontinue  Physical Exam: Vitals:   02/28/19 2352 03/01/19 0435 03/01/19 0803 03/01/19 1104  BP: (!) 142/66 (!) 143/73 132/78 112/72  Pulse: 86 99 92 94  Resp:   18   Temp: 98.4 F (36.9 C) 99.1 F (37.3 C)    TempSrc:      SpO2: 97% 97% 96%   Weight:   81.6 kg   Height:        GEN- The patient is well appearing, alert and oriented x 3 today.   HEENT: normocephalic, atraumatic; sclera clear, conjunctiva pink; hearing intact; oropharynx clear; neck supple, no JVP Lymph- no cervical lymphadenopathy Lungs- CTA b/l, normal work of breathing.  No wheezes, rales, rhonchi Heart- RRR, no murmurs, rubs or gallops, PMI not laterally displaced GI- soft, non-tender, non-distended Extremities- no clubbing, cyanosis, or edema; DP/PT2+ bilaterally, R groin without hematoma/bruit MS- no significant deformity or atrophy Skin- warm and dry, no rash or lesion Psych- euthymic mood, full affect Neuro- strength and sensation are intact   Labs:   Lab Results  Component Value Date   WBC 7.6 02/14/2019   HGB 14.5 02/14/2019   HCT 46.5 (H) 02/14/2019   MCV 93.6 02/14/2019   PLT 188 02/14/2019   No results for input(s): NA, K, CL, CO2, BUN, CREATININE, CALCIUM, PROT, BILITOT, ALKPHOS, ALT, AST, GLUCOSE in the last 168 hours.  Invalid input(s): LABALBU  Discharge Medications:  Allergies as of 03/01/2019   No Known Allergies     Medication List    TAKE these medications   aspirin EC 81 MG tablet Take 81 mg  by mouth daily with breakfast.   atorvastatin 40 MG tablet Commonly known as:  LIPITOR TAKE 1 TABLET BY MOUTH EVERY DAY   beta carotene 30 MG capsule Take 30 mg by mouth daily.   calcium gluconate 500 MG tablet Take 2 tablets by mouth daily.   ciclopirox 8 % solution Commonly known as:  Penlac Apply topically at bedtime. Apply over nail and surrounding skin. Apply daily over previous coat. After seven (7) days, may remove with alcohol and continue cycle.   co-enzyme  Q-10 30 MG capsule Take 30 mg by mouth daily.   fluticasone 50 MCG/ACT nasal spray Commonly known as:  FLONASE Place 1 spray into both nostrils daily as needed for allergies or rhinitis. ONCE A DAY IF NEEDED What changed:  additional instructions   glucosamine-chondroitin 500-400 MG tablet Take 1 tablet by mouth daily.   Lubricant Eye Drops 0.4-0.3 % Soln Generic drug:  Polyethyl Glycol-Propyl Glycol Place 1 drop into both eyes 3 (three) times daily as needed (dry/irritated eyes.).   metoprolol succinate 50 MG 24 hr tablet Commonly known as:  TOPROL-XL Take one by mouth twice daily. What changed:    how much to take  how to take this  when to take this Notes to patient:  Take one pill once daily for 3 days as we discussed then stop   multivitamin tablet Take 1 tablet by mouth daily.   naproxen sodium 220 MG tablet Commonly known as:  ALEVE Take 220 mg by mouth 2 (two) times daily as needed (pain).   OMEGA 3 PO Take 1 capsule by mouth daily.   Rivaroxaban 15 MG Tabs tablet Commonly known as:  Xarelto Take 1 tablet (15 mg total) by mouth daily with supper. Notes to patient:  Please pick up samples of this medicine at Dr. Tanna Furry office and start today.  Continue for 3 weeks uninterrupted.   SELENIUM PO Take 1 tablet by mouth daily.   Turmeric 500 MG Caps Take 1 tablet by mouth daily.   VITAMIN C PO Take 1 tablet by mouth daily.   vitamin E 400 UNIT capsule Take 400 Units by mouth daily.       Disposition: Home Discharge Instructions    Diet - low sodium heart healthy   Complete by:  As directed    Increase activity slowly   Complete by:  As directed      Follow-up Information    Evans Lance, MD Follow up.   Specialty:  Cardiology Why:  03/28/2019 @ 9:30AM Contact information: [address] [address] [phone]           Duration of Discharge Encounter: Greater than 30 minutes including physician time.   Venetia Night, PA-C 03/01/2019 11:21 AM   EP Attending   Patient seen and examined. Agree with above. She is stable for DC. I would like for her to take Xarelto for 3 weeks as she was probably in atrial flutter until a few days ago.  Mikle Bosworth.D.

## 2019-03-02 ENCOUNTER — Institutional Professional Consult (permissible substitution): Payer: Medicare Other | Admitting: Internal Medicine

## 2019-03-16 ENCOUNTER — Encounter: Payer: Self-pay | Admitting: Family Medicine

## 2019-03-16 ENCOUNTER — Encounter: Payer: Self-pay | Admitting: Internal Medicine

## 2019-03-28 ENCOUNTER — Telehealth (INDEPENDENT_AMBULATORY_CARE_PROVIDER_SITE_OTHER): Payer: Medicare Other | Admitting: Internal Medicine

## 2019-03-28 ENCOUNTER — Ambulatory Visit: Payer: Medicare Other | Admitting: Internal Medicine

## 2019-03-28 DIAGNOSIS — I483 Typical atrial flutter: Secondary | ICD-10-CM | POA: Diagnosis not present

## 2019-03-28 NOTE — Progress Notes (Signed)
Electrophysiology TeleHealth Note   Due to national recommendations of social distancing due to COVID 19, an audio/video telehealth visit is felt to be most appropriate for this patient at this time.  See MyChart message from today for the patient's consent to telehealth for [facility].   Date:  03/28/2019   ID:  Mary Summers, DOB July 04, 1932, MRN [id number]  Location: patient's home  Provider location: [address] [place]  Evaluation Performed: Follow-up visit  PCP:  Eulas Post, MD  Cardiologist:  Pixie Casino, MD  Electrophysiologist:  Dr Lovena Le  Chief Complaint:  "I feel good since you did that procedure"  History of Present Illness:    Mary Summers is a 85 y.o. female who presents via audio/video conferencing for a telehealth visit today.  She has a h/o atrial flutter and underwent EPS/RFA several weeks ago. Since last being seen in our clinic, the patient reports doing very well.  Today, she denies symptoms of palpitations, chest pain, shortness of breath,  lower extremity edema, dizziness, presyncope, or syncope.  The patient is otherwise without complaint today.  The patient denies symptoms of fevers, chills, cough, or new SOB worrisome for COVID 19.  Past Medical History:  Diagnosis Date  . Arthritis   . Hyperlipidemia   . Peripheral vascular disease ([place])    MILD CAROTID DISEASE BY DOPPLER STUDY  11/13/12. -REPORT FROM [facility]  . Seasonal allergies     Past Surgical History:  Procedure Laterality Date  . ATRIAL TACH ABLATION N/A 02/28/2019   Procedure: ATRIAL TACH ABLATION;  Surgeon: Evans Lance, MD;  Location: [facility];  Service: Cardiovascular;  Laterality: N/A;  . BREAST SURGERY     BREAST BIOPSIES X 2  - NO CANCER  . congenitally absent uterus    . DOPPLER ECHOCARDIOGRAPHY    . LASER VOCAL CORD FOR NODULE  1985    . TOTAL HIP ARTHROPLASTY  11/29/2012   Procedure: TOTAL  HIP ARTHROPLASTY ANTERIOR APPROACH;  Surgeon: Gearlean Alf, MD;  Location: [facility];  Service: Orthopedics;  Laterality: Left;    Current Outpatient Medications  Medication Sig Dispense Refill  . Ascorbic Acid (VITAMIN C PO) Take 1 tablet by mouth daily.    Marland Kitchen aspirin EC 81 MG tablet Take 81 mg by mouth daily with breakfast.    . atorvastatin (LIPITOR) 40 MG tablet TAKE 1 TABLET BY MOUTH EVERY DAY (Patient taking differently: Take 40 mg by mouth daily. ) 90 tablet 1  . beta carotene 30 MG capsule Take 30 mg by mouth daily.    . calcium gluconate 500 MG tablet Take 2 tablets by mouth daily.    . ciclopirox (PENLAC) 8 % solution Apply topically at bedtime. Apply over nail and surrounding skin. Apply daily over previous coat. After seven (7) days, may remove with alcohol and continue cycle. 6.6 mL 0  . co-enzyme Q-10 30 MG capsule Take 30 mg by mouth daily.    . fluticasone (FLONASE) 50 MCG/ACT nasal spray Place 1 spray into both nostrils daily as needed for allergies or rhinitis. ONCE A DAY IF NEEDED (Patient taking differently: Place 1 spray into both nostrils daily as needed for allergies or rhinitis. ) 16 g 2  . glucosamine-chondroitin 500-400 MG tablet Take 1 tablet by mouth daily.     . metoprolol succinate (TOPROL-XL) 50 MG 24 hr tablet Take one by mouth twice daily. (Patient taking differently:  Take 50 mg by mouth 2 (two) times daily. Take one by mouth twice daily.) 180 tablet 3  . Multiple Vitamin (MULTIVITAMIN) tablet Take 1 tablet by mouth daily.     . naproxen sodium (ALEVE) 220 MG tablet Take 220 mg by mouth 2 (two) times daily as needed (pain).     . Omega-3 Fatty Acids (OMEGA 3 PO) Take 1 capsule by mouth daily.    Vladimir Faster Glycol-Propyl Glycol (LUBRICANT EYE DROPS) 0.4-0.3 % SOLN Place 1 drop into both eyes 3 (three) times daily as needed (dry/irritated eyes.).    Marland Kitchen Rivaroxaban (XARELTO) 15 MG TABS tablet Take 1 tablet (15 mg total) by mouth daily with supper.    . SELENIUM PO  Take 1 tablet by mouth daily.    . Turmeric 500 MG CAPS Take 1 tablet by mouth daily.    . vitamin E 400 UNIT capsule Take 400 Units by mouth daily.     No current facility-administered medications for this visit.     Allergies:   Patient has no known allergies.   Social History:  The patient  reports that she has never smoked. She has never used smokeless tobacco. She reports current alcohol use. She reports that she does not use drugs.   Family History:  The patient's  family history includes Arthritis in her mother and sister; Heart failure in her father.   ROS:  Please see the history of present illness.   All other systems are personally reviewed and negative.    Exam:    Vital Signs:  There were no vitals taken for this visit.  Well appearing, alert and conversant, regular work of breathing,  good skin color Eyes- anicteric, neuro- grossly intact, skin- no apparent rash or lesions or cyanosis, mouth- oral mucosa is pink   Labs/Other Tests and Data Reviewed:    Recent Labs: 02/14/2019: BUN 18; Creatinine, Ser 1.11; Hemoglobin 14.5; Magnesium 2.4; Platelets 188; Potassium 4.6; Sodium 139; TSH 4.845   Wt Readings from Last 3 Encounters:  03/01/19 180 lb (81.6 kg)  02/22/19 184 lb 6.4 oz (83.6 kg)  02/20/19 184 lb 12.8 oz (83.8 kg)     Other studies personally reviewed: She has purchased a kardio device and HR's in the 80's.   ASSESSMENT & PLAN:    1.  Atrial flutter - she is s/p EPS/RFA and is doing well. She has come off of her anti-coagulation. 2. HTN - her blood pressure is only occaisionally elevated. I encouraged her to make a log and show to her primary MD.  3. Dyslipidemia - she will follow with Dr. Debara Pickett. 4. COVID 19 screen The patient denies symptoms of COVID 19 at this time.  The importance of social distancing was discussed today.  Follow-up:  prn Next remote: na  Current medicines are reviewed at length with the patient today.   The patient does not  have concerns regarding her medicines.  The following changes were made today:  none  Labs/ tests ordered today include:  No orders of the defined types were placed in this encounter.    Patient Risk:  after full review of this patients clinical status, I feel that they are at moderate risk at this time.  Today, I have spent 15 minutes with the patient with telehealth technology discussing all of the above .    Signed, Cristopher Peru, MD  03/28/2019 10:25 AM     [address] [place] [address] [phone] (  office) [phone] (fax)

## 2019-04-02 ENCOUNTER — Telehealth: Payer: Self-pay

## 2019-04-02 NOTE — Telephone Encounter (Signed)
Called patient and left a voice message to let her know that we are not currently seeing patients in the office and she can do a Doxy visit with Dr. Elease Hashimoto.  OK for [facility] to discuss/advise/schedule patient about the Doxy appointment.  CRM Created.

## 2019-04-03 ENCOUNTER — Other Ambulatory Visit: Payer: Self-pay

## 2019-04-03 ENCOUNTER — Ambulatory Visit (INDEPENDENT_AMBULATORY_CARE_PROVIDER_SITE_OTHER): Payer: Medicare Other | Admitting: Family Medicine

## 2019-04-03 DIAGNOSIS — I483 Typical atrial flutter: Secondary | ICD-10-CM | POA: Diagnosis not present

## 2019-04-03 DIAGNOSIS — E7800 Pure hypercholesterolemia, unspecified: Secondary | ICD-10-CM

## 2019-04-03 DIAGNOSIS — R7989 Other specified abnormal findings of blood chemistry: Secondary | ICD-10-CM

## 2019-04-03 NOTE — Progress Notes (Signed)
Patient ID: Mary Summers, female   DOB: [birth year], 85 y.o.   MRN: [id number]  Virtual Visit via Video Note  I connected with Benny Lennert on 04/03/19 at 10:30 AM EDT by a video enabled telemedicine application and verified that I am speaking with the correct person using two identifiers.  Location patient: home Location provider:work or home office Persons participating in the virtual visit: patient, provider  I discussed the limitations of evaluation and management by telemedicine and the availability of in person appointments. The patient expressed understanding and agreed to proceed.   HPI: Patient had recent ablation for atrial flutter.  She is felt much better since then.  No palpitations.  No dyspnea.  No chest pains.  No dizziness.  Increased energy levels.  Has been walking about a mile per day without difficulty.  She had been taking metoprolol and Xarelto and is now off both of these.  Back when she was diagnosed with this we had obtained TSH in February and this came back slightly elevated 4.8.  She denies any fatigue, cold intolerance, constipation, or any alopecia.  Only current medication is atorvastatin.  She is due for follow-up lipids.  Compliant with therapy.  She had had some occasional elevated blood pressure readings prior to her ablation but since that time has consistently gotten readings around 785Y systolic and 85O diastolic.   ROS: See pertinent positives and negatives per HPI.  Past Medical History:  Diagnosis Date  . Arthritis   . Hyperlipidemia   . Peripheral vascular disease ([place])    MILD CAROTID DISEASE BY DOPPLER STUDY  11/13/12. -REPORT FROM [facility]  . Seasonal allergies     Past Surgical History:  Procedure Laterality Date  . ATRIAL TACH ABLATION N/A 02/28/2019   Procedure: ATRIAL TACH ABLATION;  Surgeon: Evans Lance, MD;  Location: [place] [facility];  Service: Cardiovascular;  Laterality: N/A;  .  BREAST SURGERY     BREAST BIOPSIES X 2  - NO CANCER  . congenitally absent uterus    . DOPPLER ECHOCARDIOGRAPHY    . LASER VOCAL CORD FOR NODULE  1985    . TOTAL HIP ARTHROPLASTY  11/29/2012   Procedure: TOTAL HIP ARTHROPLASTY ANTERIOR APPROACH;  Surgeon: Gearlean Alf, MD;  Location: [facility];  Service: Orthopedics;  Laterality: Left;    Family History  Problem Relation Age of Onset  . Arthritis Mother   . Heart failure Father   . Arthritis Sister   . Lung disease Neg Hx   . Rheumatologic disease Neg Hx     SOCIAL HX: non-smoker   Current Outpatient Medications:  .  Ascorbic Acid (VITAMIN C PO), Take 1 tablet by mouth daily., Disp: , Rfl:  .  aspirin EC 81 MG tablet, Take 81 mg by mouth daily with breakfast., Disp: , Rfl:  .  atorvastatin (LIPITOR) 40 MG tablet, TAKE 1 TABLET BY MOUTH EVERY DAY (Patient taking differently: Take 40 mg by mouth daily. ), Disp: 90 tablet, Rfl: 1 .  beta carotene 30 MG capsule, Take 30 mg by mouth daily., Disp: , Rfl:  .  calcium gluconate 500 MG tablet, Take 2 tablets by mouth daily., Disp: , Rfl:  .  ciclopirox (PENLAC) 8 % solution, Apply topically at bedtime. Apply over nail and surrounding skin. Apply daily over previous coat. After seven (7) days, may remove with alcohol and continue cycle., Disp: 6.6 mL, Rfl: 0 .  co-enzyme Q-10 30 MG capsule, Take  30 mg by mouth daily., Disp: , Rfl:  .  fluticasone (FLONASE) 50 MCG/ACT nasal spray, Place 1 spray into both nostrils daily as needed for allergies or rhinitis. ONCE A DAY IF NEEDED (Patient taking differently: Place 1 spray into both nostrils daily as needed for allergies or rhinitis. ), Disp: 16 g, Rfl: 2 .  glucosamine-chondroitin 500-400 MG tablet, Take 1 tablet by mouth daily. , Disp: , Rfl:  .  Multiple Vitamin (MULTIVITAMIN) tablet, Take 1 tablet by mouth daily. , Disp: , Rfl:  .  naproxen sodium (ALEVE) 220 MG tablet, Take 220 mg by mouth 2 (two) times daily as needed (pain). , Disp: , Rfl:  .   Omega-3 Fatty Acids (OMEGA 3 PO), Take 1 capsule by mouth daily., Disp: , Rfl:  .  Polyethyl Glycol-Propyl Glycol (LUBRICANT EYE DROPS) 0.4-0.3 % SOLN, Place 1 drop into both eyes 3 (three) times daily as needed (dry/irritated eyes.)., Disp: , Rfl:  .  SELENIUM PO, Take 1 tablet by mouth daily., Disp: , Rfl:  .  Turmeric 500 MG CAPS, Take 1 tablet by mouth daily., Disp: , Rfl:  .  vitamin E 400 UNIT capsule, Take 400 Units by mouth daily., Disp: , Rfl:   EXAM:  VITALS per patient if applicable:  GENERAL: alert, oriented, appears well and in no acute distress  HEENT: atraumatic, conjunttiva clear, no obvious abnormalities on inspection of external nose and ears  NECK: normal movements of the head and neck  LUNGS: on inspection no signs of respiratory distress, breathing rate appears normal, no obvious gross SOB, gasping or wheezing  CV: no obvious cyanosis  MS: moves all visible extremities without noticeable abnormality  PSYCH/NEURO: pleasant and cooperative, no obvious depression or anxiety, speech and thought processing grossly intact  ASSESSMENT AND PLAN:  Discussed the following assessment and plan:  #1 history of atrial flutter with recent ablation procedure which went well.  Patient is feeling much better symptomatically since then and now off Xarelto and metoprolol  #2 mildly elevated TSH by recent labs of uncertain significance.  Question subclinical hypothyroidism -We will plan to repeat TSH and free T4 hopefully within a few months when she can safely come back to this clinic  #3 dyslipidemia.  Patient on atorvastatin.  Needs follow-up lipids -We will hopefully be able to come in for fasting lipid panel in about 3 months     I discussed the assessment and treatment plan with the patient. The patient was provided an opportunity to ask questions and all were answered. The patient agreed with the plan and demonstrated an understanding of the instructions.   The patient  was advised to call back or seek an in-person evaluation if the symptoms worsen or if the condition fails to improve as anticipated.   Carolann Littler, MD

## 2019-04-20 ENCOUNTER — Telehealth: Payer: Self-pay

## 2019-04-20 ENCOUNTER — Telehealth: Payer: Self-pay | Admitting: Internal Medicine

## 2019-04-20 NOTE — Telephone Encounter (Signed)
Smartphone/consent/ my chart/ pre reg completed °

## 2019-04-20 NOTE — Telephone Encounter (Signed)

## 2019-04-23 ENCOUNTER — Telehealth (INDEPENDENT_AMBULATORY_CARE_PROVIDER_SITE_OTHER): Payer: Medicare Other | Admitting: Internal Medicine

## 2019-04-23 ENCOUNTER — Encounter: Payer: Self-pay | Admitting: Internal Medicine

## 2019-04-23 VITALS — BP 132/77 | HR 80 | Ht 67.0 in | Wt 175.0 lb

## 2019-04-23 DIAGNOSIS — I471 Supraventricular tachycardia: Secondary | ICD-10-CM

## 2019-04-23 DIAGNOSIS — Z7189 Other specified counseling: Secondary | ICD-10-CM | POA: Diagnosis not present

## 2019-04-23 DIAGNOSIS — E785 Hyperlipidemia, unspecified: Secondary | ICD-10-CM | POA: Diagnosis not present

## 2019-04-23 DIAGNOSIS — I739 Peripheral vascular disease, unspecified: Secondary | ICD-10-CM

## 2019-04-23 NOTE — Progress Notes (Signed)
Virtual Visit via Video Note   This visit type was conducted due to national recommendations for restrictions regarding the COVID-19 Pandemic (e.g. social distancing) in an effort to limit this patient's exposure and mitigate transmission in our community.  Due to her co-morbid illnesses, this patient is at least at moderate risk for complications without adequate follow up.  This format is felt to be most appropriate for this patient at this time.  All issues noted in this document were discussed and addressed.  A limited physical exam was performed with this format.  Please refer to the patient's chart for her consent to telehealth for [facility].   Evaluation Performed:  Doximity video visit  Date:  04/23/2019   ID:  JASHAWNA REEVER, DOB 05-18-1932, MRN [id number]  Patient Location:  [address] [place] [address]  Provider location:   [address], [place], [place] [address]  PCP:  Eulas Post, MD  Cardiologist:  Pixie Casino, MD Electrophysiologist:  None   Chief Complaint:  No complaints  History of Present Illness:    DARE SPILLMAN is a 85 y.o. female who presents via audio/video conferencing for a telehealth visit today.  Bowen was seen today for video visit follow-up.  She has a Investment banker, operational mobile device at home and provided to remote transmissions.  1 shows normal sinus rhythm in the 80s and another she has a sinus tachycardia in the low 100s.  She has struggled with PSVT and in January was admitted and seen by Dr. Lovena Le.  He performed an SVT ablation.  Since then she has been asymptomatic without any recurrent SVT.  She stopped her beta-blocker and is only on aspirin and atorvastatin.  She also takes a number of supplements.  She has not had any repeat lab work since her routine office visit a year ago.  She and her husband are not planning on going to their annual trip to [place] this year due to the COVID-19 pandemic.  The patient does  not have symptoms concerning for COVID-19 infection (fever, chills, cough, or new SHORTNESS OF BREATH).    Prior CV studies:   The following studies were reviewed today:  Lab work Hospital notes Electrophysiology procedure  PMHx:  Past Medical History:  Diagnosis Date  . Arthritis   . Hyperlipidemia   . Peripheral vascular disease ([place])    MILD CAROTID DISEASE BY DOPPLER STUDY  11/13/12. -REPORT FROM [facility]  . Seasonal allergies     Past Surgical History:  Procedure Laterality Date  . ATRIAL TACH ABLATION N/A 02/28/2019   Procedure: ATRIAL TACH ABLATION;  Surgeon: Evans Lance, MD;  Location: [facility];  Service: Cardiovascular;  Laterality: N/A;  . BREAST SURGERY     BREAST BIOPSIES X 2  - NO CANCER  . congenitally absent uterus    . DOPPLER ECHOCARDIOGRAPHY    . LASER VOCAL CORD FOR NODULE  1985    . TOTAL HIP ARTHROPLASTY  11/29/2012   Procedure: TOTAL HIP ARTHROPLASTY ANTERIOR APPROACH;  Surgeon: Gearlean Alf, MD;  Location: [facility];  Service: Orthopedics;  Laterality: Left;    FAMHx:  Family History  Problem Relation Age of Onset  . Arthritis Mother   . Heart failure Father   . Arthritis Sister   . Lung disease Neg Hx   . Rheumatologic disease Neg Hx     SOCHx:   reports that she has never smoked. She  has never used smokeless tobacco. She reports current alcohol use. She reports that she does not use drugs.  ALLERGIES:  No Known Allergies  MEDS:  Current Meds  Medication Sig  . Ascorbic Acid (VITAMIN C PO) Take 1 tablet by mouth daily.  Marland Kitchen aspirin EC 81 MG tablet Take 81 mg by mouth daily with breakfast.  . atorvastatin (LIPITOR) 40 MG tablet TAKE 1 TABLET BY MOUTH EVERY DAY  . beta carotene 30 MG capsule Take 30 mg by mouth daily.  . calcium gluconate 500 MG tablet Take 2 tablets by mouth daily.  . ciclopirox (PENLAC) 8 % solution Apply topically at bedtime. Apply over nail and surrounding skin. Apply  daily over previous coat. After seven (7) days, may remove with alcohol and continue cycle.  Marland Kitchen co-enzyme Q-10 30 MG capsule Take 30 mg by mouth daily.  Marland Kitchen glucosamine-chondroitin 500-400 MG tablet Take 1 tablet by mouth daily.   . Multiple Vitamin (MULTIVITAMIN) tablet Take 1 tablet by mouth daily.   . naproxen sodium (ALEVE) 220 MG tablet Take 220 mg by mouth 2 (two) times daily as needed (pain).   . Omega-3 Fatty Acids (OMEGA 3 PO) Take 1 capsule by mouth daily.  Vladimir Faster Glycol-Propyl Glycol (LUBRICANT EYE DROPS) 0.4-0.3 % SOLN Place 1 drop into both eyes 3 (three) times daily as needed (dry/irritated eyes.).  Marland Kitchen SELENIUM PO Take 1 tablet by mouth daily.  . Turmeric 500 MG CAPS Take 1 tablet by mouth daily.  . Vitamin D, Cholecalciferol, 50 MCG (2000 UT) CAPS Take 2,000 Units by mouth daily.  . vitamin E 400 UNIT capsule Take 400 Units by mouth daily.     ROS: Pertinent items noted in HPI and remainder of comprehensive ROS otherwise negative.  Labs/Other Tests and Data Reviewed:    Recent Labs: 02/14/2019: BUN 18; Creatinine, Ser 1.11; Hemoglobin 14.5; Magnesium 2.4; Platelets 188; Potassium 4.6; Sodium 139; TSH 4.845   Recent Lipid Panel Lab Results  Component Value Date/Time   CHOL 137 04/18/2018 11:25 AM   TRIG 87 04/18/2018 11:25 AM   HDL 57 04/18/2018 11:25 AM   CHOLHDL 2.4 04/18/2018 11:25 AM   CHOLHDL 2 12/27/2017 10:13 AM   LDLCALC 63 04/18/2018 11:25 AM   LDLDIRECT 179.3 11/17/2011 10:16 AM    Wt Readings from Last 3 Encounters:  04/23/19 175 lb (79.4 kg)  03/01/19 180 lb (81.6 kg)  02/22/19 184 lb 6.4 oz (83.6 kg)     Exam:    Vital Signs:  BP 132/77   Pulse 80   Ht 5\' 7"  (1.702 m)   Wt 175 lb (79.4 kg)   SpO2 93%   BMI 27.41 kg/m    General appearance: alert and no distress Lungs: No audible wheezes or visual respiratory difficulty Extremities: extremities normal, atraumatic, no cyanosis or edema Skin: Skin color, texture, turgor normal. No rashes or  lesions Neurologic: Mental status: Alert, oriented, thought content appropriate Psych: Pleasant  ASSESSMENT & PLAN:    1. PSVT - s/p ablation by Dr. Lovena Le (12/2018) 2. CT and x-ray findings of COPD/interstitial lung disease 3. Dyslipidemia 4. Mild bilateral carotid artery disease 5. Productive nocturnal cough - likely related to COPD 6. Onychomycosis  [name] continues to do well and has had no recurrent SVT since elective ablation by Dr. Lovena Le in January 2020.  She is now off of beta-blocker.  Her cholesterol has been well controlled but is due for repeat lipid profile.  She does have bilateral carotid artery disease  which is mild.  She continues to be very active.  She is mowing her lawn and doing a lot of gardening.  I reminded her about COVID-19 as below.  COVID-19 Education: The signs and symptoms of COVID-19 were discussed with the patient and how to seek care for testing (follow up with PCP or arrange E-visit).  The importance of social distancing was discussed today.  Patient Risk:   After full review of this patients clinical status, I feel that they are at least moderate risk at this time.  Time:   Today, I have spent 25 minutes with the patient with telehealth technology discussing SVT, lipid management, bilateral carotid artery disease.     Medication Adjustments/Labs and Tests Ordered: Current medicines are reviewed at length with the patient today.  Concerns regarding medicines are outlined above.   Tests Ordered: Orders Placed This Encounter  Procedures  . Lipid panel    Medication Changes: No orders of the defined types were placed in this encounter.   Disposition:  in 6 month(s)  Pixie Casino, MD, [facility], [place] Director of the Advanced Lipid Disorders &  Cardiovascular Risk Reduction Clinic Diplomate of the American Board of Clinical Lipidology Attending Cardiologist  Direct Dial: [phone]  Fax:  [phone]  Website:  [email or web address]  Pixie Casino, MD  04/23/2019 10:52 AM

## 2019-04-23 NOTE — Patient Instructions (Signed)
Medication Instructions:  Continue current medications If you need a refill on your cardiac medications before your next appointment, please call your pharmacy.   Lab work: Fasting lab work THIS WEEK to check cholesterol  If you have labs (blood work) drawn today and your tests are completely normal, you will receive your results only by: Marland Kitchen MyChart Message (if you have MyChart) OR . A paper copy in the mail If you have any lab test that is abnormal or we need to change your treatment, we will call you to review the results.  Testing/Procedures: None needed  Follow-Up: At [facility], you and your health needs are our priority.  As part of our continuing mission to provide you with exceptional heart care, we have created designated Provider Care Teams.  These Care Teams include your primary Cardiologist (physician) and Advanced Practice Providers (APPs -  Physician Assistants and Nurse Practitioners) who all work together to provide you with the care you need, when you need it. You will need a follow up appointment in 12 months.  Please call our office 2 months in advance to schedule this appointment.  You may see Pixie Casino, MD or one of the following Advanced Practice Providers on your designated Care Team: [place], [place] . Fabian Sharp, PA-C  Any Other Special Instructions Will Be Listed Below (If Applicable).

## 2019-04-25 LAB — LIPID PANEL
Chol/HDL Ratio: 2.2 ratio (ref 0.0–4.4)
Cholesterol, Total: 161 mg/dL (ref 100–199)
HDL: 74 mg/dL (ref >39)
LDL Calculated: 72 mg/dL (ref 0–99)
Triglycerides: 75 mg/dL (ref 0–149)
VLDL Cholesterol Cal: 15 mg/dL (ref 5–40)

## 2019-08-18 ENCOUNTER — Other Ambulatory Visit: Payer: Self-pay | Admitting: Internal Medicine

## 2020-01-04 ENCOUNTER — Encounter: Payer: Self-pay | Admitting: Family Medicine

## 2020-01-04 ENCOUNTER — Other Ambulatory Visit: Payer: Self-pay

## 2020-01-04 ENCOUNTER — Ambulatory Visit (INDEPENDENT_AMBULATORY_CARE_PROVIDER_SITE_OTHER): Payer: Medicare Other | Admitting: Family Medicine

## 2020-01-04 VITALS — BP 120/74 | HR 85 | Temp 97.8°F | Ht 66.0 in | Wt 174.5 lb

## 2020-01-04 DIAGNOSIS — Z0000 Encounter for general adult medical examination without abnormal findings: Secondary | ICD-10-CM

## 2020-01-04 DIAGNOSIS — Z780 Asymptomatic menopausal state: Secondary | ICD-10-CM

## 2020-01-04 LAB — LIPID PANEL
Cholesterol: 175 mg/dL (ref 0–200)
HDL: 72.2 mg/dL (ref >39.00)
LDL Cholesterol: 87 mg/dL (ref 0–99)
NonHDL: 102.81
Total CHOL/HDL Ratio: 2
Triglycerides: 80 mg/dL (ref 0.0–149.0)
VLDL: 16 mg/dL (ref 0.0–40.0)

## 2020-01-04 LAB — CBC WITH DIFFERENTIAL/PLATELET
Basophils Absolute: 0 10*3/uL (ref 0.0–0.1)
Basophils Relative: 0.7 % (ref 0.0–3.0)
Eosinophils Absolute: 0.2 10*3/uL (ref 0.0–0.7)
Eosinophils Relative: 3.2 % (ref 0.0–5.0)
HCT: 44.2 % (ref 36.0–46.0)
Hemoglobin: 14.6 g/dL (ref 12.0–15.0)
Lymphocytes Relative: 16.7 % (ref 12.0–46.0)
Lymphs Abs: 1.1 10*3/uL (ref 0.7–4.0)
MCHC: 33 g/dL (ref 30.0–36.0)
MCV: 91.7 fl (ref 78.0–100.0)
Monocytes Absolute: 0.5 10*3/uL (ref 0.1–1.0)
Monocytes Relative: 8.2 % (ref 3.0–12.0)
Neutro Abs: 4.7 10*3/uL (ref 1.4–7.7)
Neutrophils Relative %: 71.2 % (ref 43.0–77.0)
Platelets: 194 10*3/uL (ref 150.0–400.0)
RBC: 4.82 Mil/uL (ref 3.87–5.11)
RDW: 13.1 % (ref 11.5–15.5)
WBC: 6.6 10*3/uL (ref 4.0–10.5)

## 2020-01-04 LAB — HEPATIC FUNCTION PANEL
ALT: 17 U/L (ref 0–35)
AST: 21 U/L (ref 0–37)
Albumin: 4.3 g/dL (ref 3.5–5.2)
Alkaline Phosphatase: 83 U/L (ref 39–117)
Bilirubin, Direct: 0.2 mg/dL (ref 0.0–0.3)
Total Bilirubin: 1.1 mg/dL (ref 0.2–1.2)
Total Protein: 6.8 g/dL (ref 6.0–8.3)

## 2020-01-04 LAB — BASIC METABOLIC PANEL
BUN: 12 mg/dL (ref 6–23)
CO2: 29 mEq/L (ref 19–32)
Calcium: 9.3 mg/dL (ref 8.4–10.5)
Chloride: 102 mEq/L (ref 96–112)
Creatinine, Ser: 0.65 mg/dL (ref 0.40–1.20)
GFR: 86.51 mL/min (ref >60.00)
Glucose, Bld: 93 mg/dL (ref 70–99)
Potassium: 4.4 mEq/L (ref 3.5–5.1)
Sodium: 141 mEq/L (ref 135–145)

## 2020-01-04 LAB — TSH: TSH: 2.52 u[IU]/mL (ref 0.35–4.50)

## 2020-01-04 NOTE — Patient Instructions (Signed)
Preventive Care 38 Years and Older, Female Preventive care refers to lifestyle choices and visits with your health care provider that can promote health and wellness. This includes:  A yearly physical exam. This is also called an annual well check.  Regular dental and eye exams.  Immunizations.  Screening for certain conditions.  Healthy lifestyle choices, such as diet and exercise. What can I expect for my preventive care visit? Physical exam Your health care provider will check:  Height and weight. These may be used to calculate body mass index (BMI), which is a measurement that tells if you are at a healthy weight.  Heart rate and blood pressure.  Your skin for abnormal spots. Counseling Your health care provider may ask you questions about:  Alcohol, tobacco, and drug use.  Emotional well-being.  Home and relationship well-being.  Sexual activity.  Eating habits.  History of falls.  Memory and ability to understand (cognition).  Work and work Statistician.  Pregnancy and menstrual history. What immunizations do I need?  Influenza (flu) vaccine  This is recommended every year. Tetanus, diphtheria, and pertussis (Tdap) vaccine  You may need a Td booster every 10 years. Varicella (chickenpox) vaccine  You may need this vaccine if you have not already been vaccinated. Zoster (shingles) vaccine  You may need this after age 33. Pneumococcal conjugate (PCV13) vaccine  One dose is recommended after age 33. Pneumococcal polysaccharide (PPSV23) vaccine  One dose is recommended after age 72. Measles, mumps, and rubella (MMR) vaccine  You may need at least one dose of MMR if you were born in 1957 or later. You may also need a second dose. Meningococcal conjugate (MenACWY) vaccine  You may need this if you have certain conditions. Hepatitis A vaccine  You may need this if you have certain conditions or if you travel or work in places where you may be exposed  to hepatitis A. Hepatitis B vaccine  You may need this if you have certain conditions or if you travel or work in places where you may be exposed to hepatitis B. Haemophilus influenzae type b (Hib) vaccine  You may need this if you have certain conditions. You may receive vaccines as individual doses or as more than one vaccine together in one shot (combination vaccines). Talk with your health care provider about the risks and benefits of combination vaccines. What tests do I need? Blood tests  Lipid and cholesterol levels. These may be checked every 5 years, or more frequently depending on your overall health.  Hepatitis C test.  Hepatitis B test. Screening  Lung cancer screening. You may have this screening every year starting at age 39 if you have a 30-pack-year history of smoking and currently smoke or have quit within the past 15 years.  Colorectal cancer screening. All adults should have this screening starting at age 36 and continuing until age 15. Your health care provider may recommend screening at age 23 if you are at increased risk. You will have tests every 1-10 years, depending on your results and the type of screening test.  Diabetes screening. This is done by checking your blood sugar (glucose) after you have not eaten for a while (fasting). You may have this done every 1-3 years.  Mammogram. This may be done every 1-2 years. Talk with your health care provider about how often you should have regular mammograms.  BRCA-related cancer screening. This may be done if you have a family history of breast, ovarian, tubal, or peritoneal cancers.  Other tests  Sexually transmitted disease (STD) testing.  Bone density scan. This is done to screen for osteoporosis. You may have this done starting at age 44. Follow these instructions at home: Eating and drinking  Eat a diet that includes fresh fruits and vegetables, whole grains, lean protein, and low-fat dairy products. Limit  your intake of foods with high amounts of sugar, saturated fats, and salt.  Take vitamin and mineral supplements as recommended by your health care provider.  Do not drink alcohol if your health care provider tells you not to drink.  If you drink alcohol: ? Limit how much you have to 0-1 drink a day. ? Be aware of how much alcohol is in your drink. In the [place] one drink equals one 12 oz bottle of beer (355 mL), one 5 oz glass of wine (148 mL), or one 1 oz glass of hard liquor (44 mL). Lifestyle  Take daily care of your teeth and gums.  Stay active. Exercise for at least 30 minutes on 5 or more days each week.  Do not use any products that contain nicotine or tobacco, such as cigarettes, e-cigarettes, and chewing tobacco. If you need help quitting, ask your health care provider.  If you are sexually active, practice safe sex. Use a condom or other form of protection in order to prevent STIs (sexually transmitted infections).  Talk with your health care provider about taking a low-dose aspirin or statin. What's next?  Go to your health care provider once a year for a well check visit.  Ask your health care provider how often you should have your eyes and teeth checked.  Stay up to date on all vaccines. This information is not intended to replace advice given to you by your health care provider. Make sure you discuss any questions you have with your health care provider. Document Revised: 11/30/2018 Document Reviewed: 11/30/2018 Elsevier Patient Education  2020 [employer].

## 2020-01-04 NOTE — Progress Notes (Signed)
Subjective:     Patient ID: Mary Summers, female   DOB: 08-06-1932, 86 y.o.   MRN: [id number]  HPI Charliann is seen for physical exam.  She has history of atrial flutter, osteoarthritis, hyperlipidemia.  She underwent ablation procedure last March and has done generally well since then.  She had a couple recent episodes where she had transient heart rate up to 150 and her home monitor suggested possible A. fib.  She has not had any significant dizziness, chest pains, or dyspnea with episodes  Generally doing well.  She does have some frequent chest congestion which she states she had for years.  She thinks it may be postnasal drip related.  Symptoms seem to be worse at night.  No known asthma.  Non-smoker.  She had first Covid vaccine and is scheduled for second.  Health maintenance reviewed  -Flu vaccine received back in October.  She is due for tetanus. -No mammogram in years -Pneumonia vaccines up-to-date -Has had previous Shingrix vaccine -Has aged out of screening colonoscopies  Past Medical History:  Diagnosis Date  . Arthritis   . Hyperlipidemia   . Peripheral vascular disease ([place])    MILD CAROTID DISEASE BY DOPPLER STUDY  11/13/12. -REPORT FROM [facility]  . Seasonal allergies    Past Surgical History:  Procedure Laterality Date  . ATRIAL TACH ABLATION N/A 02/28/2019   Procedure: ATRIAL TACH ABLATION;  Surgeon: Evans Lance, MD;  Location: [facility];  Service: Cardiovascular;  Laterality: N/A;  . BREAST SURGERY     BREAST BIOPSIES X 2  - NO CANCER  . congenitally absent uterus    . DOPPLER ECHOCARDIOGRAPHY    . LASER VOCAL CORD FOR NODULE  1985    . TOTAL HIP ARTHROPLASTY  11/29/2012   Procedure: TOTAL HIP ARTHROPLASTY ANTERIOR APPROACH;  Surgeon: Gearlean Alf, MD;  Location: [facility];  Service: Orthopedics;  Laterality: Left;    reports that she has never smoked. She has never used smokeless tobacco. She reports  current alcohol use. She reports that she does not use drugs. family history includes Arthritis in her mother and sister; Heart failure in her father. No Known Allergies   Review of Systems  Constitutional: Negative for activity change, appetite change, chills, fatigue, fever and unexpected weight change.  HENT: Positive for congestion and postnasal drip. Negative for ear pain, hearing loss, sore throat and trouble swallowing.   Eyes: Negative for visual disturbance.  Respiratory: Negative for cough, shortness of breath and wheezing.   Cardiovascular: Negative for chest pain and palpitations.  Gastrointestinal: Negative for abdominal pain, blood in stool, constipation and diarrhea.  Genitourinary: Negative for dysuria and hematuria.  Musculoskeletal: Negative for arthralgias, back pain and myalgias.  Skin: Negative for rash.  Neurological: Negative for dizziness, syncope and headaches.  Hematological: Negative for adenopathy.  Psychiatric/Behavioral: Negative for confusion and dysphoric mood.       Objective:   Physical Exam Constitutional:      Appearance: She is well-developed.  HENT:     Head: Normocephalic and atraumatic.  Eyes:     Pupils: Pupils are equal, round, and reactive to light.  Neck:     Thyroid: No thyromegaly.  Cardiovascular:     Rate and Rhythm: Normal rate and regular rhythm.     Heart sounds: Normal heart sounds. No murmur.  Pulmonary:     Effort: No respiratory distress.     Breath sounds: Normal breath sounds. No wheezing  or rales.  Abdominal:     General: Bowel sounds are normal. There is no distension.     Palpations: Abdomen is soft. There is no mass.     Tenderness: There is no abdominal tenderness. There is no guarding or rebound.  Musculoskeletal:        General: Normal range of motion.     Cervical back: Normal range of motion and neck supple.  Lymphadenopathy:     Cervical: No cervical adenopathy.  Skin:    Findings: No rash.   Neurological:     Mental Status: She is alert and oriented to person, place, and time.     Cranial Nerves: No cranial nerve deficit.  Psychiatric:        Behavior: Behavior normal.        Thought Content: Thought content normal.        Judgment: Judgment normal.        Assessment:     Physical exam.  She has past history of atrial flutter with previous ablation.  Currently appears to be in sinus rhythm today with normal heart rate.  She has frequent postnasal drip symptoms and we have suggested the following    Plan:     -Obtain screening labs -We discussed setting up DEXA scan which she has not had in several years -She is encouraged to set up repeat mammogram.  She was given phone number and information for setting that up -Suggested trial of Flonase daily for the next couple weeks also consider elevating head of bed 4 to 6 inches -Continue regular weightbearing exercise and daily calcium and vitamin D  Eulas Post MD [facility] at [facility]

## 2020-01-05 ENCOUNTER — Encounter: Payer: Self-pay | Admitting: Family Medicine

## 2020-01-18 ENCOUNTER — Other Ambulatory Visit: Payer: Self-pay | Admitting: Family Medicine

## 2020-01-18 DIAGNOSIS — N6320 Unspecified lump in the left breast, unspecified quadrant: Secondary | ICD-10-CM

## 2020-02-11 ENCOUNTER — Encounter: Payer: Self-pay | Admitting: Family Medicine

## 2020-02-20 ENCOUNTER — Ambulatory Visit: Admission: RE | Admit: 2020-02-20 | Payer: Medicare Other | Source: Ambulatory Visit

## 2020-02-20 ENCOUNTER — Ambulatory Visit
Admission: RE | Admit: 2020-02-20 | Discharge: 2020-02-20 | Disposition: A | Discharge status: Home / Self Care | Payer: Medicare Other | Source: Ambulatory Visit | Attending: Family Medicine | Admitting: Family Medicine

## 2020-02-20 ENCOUNTER — Other Ambulatory Visit: Payer: Self-pay

## 2020-02-20 DIAGNOSIS — N6320 Unspecified lump in the left breast, unspecified quadrant: Secondary | ICD-10-CM

## 2020-02-25 ENCOUNTER — Encounter: Payer: Self-pay | Admitting: Family Medicine

## 2020-02-29 ENCOUNTER — Ambulatory Visit (INDEPENDENT_AMBULATORY_CARE_PROVIDER_SITE_OTHER)
Admission: RE | Admit: 2020-02-29 | Discharge: 2020-02-29 | Disposition: A | Discharge status: Home / Self Care | Payer: Medicare Other | Source: Ambulatory Visit

## 2020-02-29 ENCOUNTER — Other Ambulatory Visit: Payer: Self-pay

## 2020-02-29 DIAGNOSIS — Z780 Asymptomatic menopausal state: Secondary | ICD-10-CM | POA: Diagnosis not present

## 2020-03-03 ENCOUNTER — Other Ambulatory Visit: Payer: Self-pay

## 2020-03-04 ENCOUNTER — Ambulatory Visit (INDEPENDENT_AMBULATORY_CARE_PROVIDER_SITE_OTHER): Payer: Medicare Other | Admitting: Family Medicine

## 2020-03-04 ENCOUNTER — Encounter: Payer: Self-pay | Admitting: Family Medicine

## 2020-03-04 ENCOUNTER — Telehealth: Payer: Self-pay | Admitting: Family Medicine

## 2020-03-04 VITALS — BP 124/68 | HR 98 | Temp 97.6°F | Wt 180.4 lb

## 2020-03-04 DIAGNOSIS — M85852 Other specified disorders of bone density and structure, left thigh: Secondary | ICD-10-CM | POA: Diagnosis not present

## 2020-03-04 DIAGNOSIS — M170 Bilateral primary osteoarthritis of knee: Secondary | ICD-10-CM

## 2020-03-04 DIAGNOSIS — M180 Bilateral primary osteoarthritis of first carpometacarpal joints: Secondary | ICD-10-CM

## 2020-03-04 MED ORDER — DICLOFENAC SODIUM 1 % EX GEL: 2.0000 g | Freq: Four times a day (QID) | CUTANEOUS | 2 refills | Status: DC

## 2020-03-04 NOTE — Telephone Encounter (Signed)
Please advise 

## 2020-03-04 NOTE — Telephone Encounter (Signed)
Pt was just seen a few minutes ago and forgot to ask a few questions. Pt would like to know about her sugars, eating a lot of sweets and how it effects her? Also, does eating sweets have an effect on calcium? Thanks

## 2020-03-04 NOTE — Telephone Encounter (Signed)
Pt has been told note below nothing further needed

## 2020-03-04 NOTE — Telephone Encounter (Signed)
No major effect on calcium but should try to keep high glycemic foods down.

## 2020-03-04 NOTE — Progress Notes (Signed)
Subjective:     Patient ID: Mary Summers, female   DOB: [birth year], 86 y.o.   MRN: [id number]  HPI   Mary Summers is seen to discuss the following issues  Recent DEXA scan.  She had T score in the osteopenic range at the hip but increased FRAX score with 4% 10-year risk of hip fracture.  She stays very active.  She does take some calcium and vitamin D but below recommended thresholds.  She had 1 fall in the past year but no fractures.  No history of recent fracture of the past several years.  Her T score was -1.7 in the right femoral neck she has had previous left total hip replacement. Non-smoker.    She does have some osteoarthritis involving especially her knees.  She is requesting physical therapy for some strengthening exercises and to reduce fall risk.  She has had some ongoing pains left thumb CMC and MCP joints.  She has seen hand specialist in the past for that.  She had steroid injection which helped temporarily  Past Medical History:  Diagnosis Date  . Arthritis   . Hyperlipidemia   . Peripheral vascular disease ([place])    MILD CAROTID DISEASE BY DOPPLER STUDY  11/13/12. -REPORT FROM DR[facility]  . Seasonal allergies    Past Surgical History:  Procedure Laterality Date  . ATRIAL TACH ABLATION N/A 02/28/2019   Procedure: ATRIAL TACH ABLATION;  Surgeon: Evans Lance, MD;  Location: [facility];  Service: Cardiovascular;  Laterality: N/A;  . BREAST SURGERY     BREAST BIOPSIES X 2  - NO CANCER  . congenitally absent uterus    . DOPPLER ECHOCARDIOGRAPHY    . LASER VOCAL CORD FOR NODULE  1985    . TOTAL HIP ARTHROPLASTY  11/29/2012   Procedure: TOTAL HIP ARTHROPLASTY ANTERIOR APPROACH;  Surgeon: Gearlean Alf, MD;  Location: [facility];  Service: Orthopedics;  Laterality: Left;    reports that she has never smoked. She has never used smokeless tobacco. She reports current alcohol use. She reports that she does not use drugs. family history  includes Arthritis in her mother and sister; Heart failure in her father. No Known Allergies   Review of Systems  Constitutional: Negative for chills.  Respiratory: Negative for shortness of breath.   Cardiovascular: Negative for chest pain.  Musculoskeletal: Positive for arthralgias.  Neurological: Negative for dizziness.       Objective:   Physical Exam Vitals reviewed.  Constitutional:      Appearance: Normal appearance.  Cardiovascular:     Rate and Rhythm: Normal rate and regular rhythm.  Pulmonary:     Effort: Pulmonary effort is normal.     Breath sounds: Normal breath sounds.  Musculoskeletal:     Right lower leg: No edema.     Left lower leg: No edema.  Neurological:     General: No focal deficit present.     Mental Status: She is alert.        Assessment:     #1 osteopenia.  She had T score -1.7 right femur but FRAX score = 4% risk of hip fracture over the next 10 years  #2 primary osteoarthritis involving left thumb  #3 osteoarthritis involving the knees    Plan:     -We discussed trial of topical diclofenac 1% gel to use to thumb arthritis 3-4 times daily as needed -Continue follow-up with hand orthopedics if this not adequate help -Set up  physical therapy for some quadricep strengthening and reduce fall risk -We had long discussion regarding various treatment options for osteopenia with high FRAX score.  At this point she prefers to focus on continued weightbearing exercise and increasing her calcium and vitamin D.  She is currently getting below 1200 mg calcium/day and vitamin D 1000 IU/day.  We did discuss possible oral bisphosphonate use as the next step.  We will look at seeing if we get DEXA scan covered in 1 year to reassess  Eulas Post MD [facility] at [facility]

## 2020-03-04 NOTE — Patient Instructions (Signed)
Osteopenia  Osteopenia is a loss of thickness (density) inside of the bones. Another name for osteopenia is low bone mass. Mild osteopenia is a normal part of aging. It is not a disease, and it does not cause symptoms. However, if you have osteopenia and continue to lose bone mass, you could develop a condition that causes the bones to become thin and break more easily (osteoporosis). You may also lose some height, have back pain, and have a stooped posture. Although osteopenia is not a disease, making changes to your lifestyle and diet can help to prevent osteopenia from developing into osteoporosis. What are the causes? Osteopenia is caused by loss of calcium in the bones.  Bones are constantly changing. Old bone cells are continually being replaced with new bone cells. This process builds new bone. The mineral calcium is needed to build new bone and maintain bone density. Bone density is usually highest around age 35. After that, most people's bodies cannot replace all the bone they have lost with new bone. What increases the risk? You are more likely to develop this condition if:  You are older than age 50.  You are a woman who went through menopause early.  You have a long illness that keeps you in bed.  You do not get enough exercise.  You lack certain nutrients (malnutrition).  You have an overactive thyroid gland (hyperthyroidism).  You smoke.  You drink a lot of alcohol.  You are taking medicines that weaken the bones, such as steroids. What are the signs or symptoms? This condition does not cause any symptoms. You may have a slightly higher risk for bone breaks (fractures), so getting fractures more easily than normal may be an indication of osteopenia. How is this diagnosed? Your health care provider can diagnose this condition with a special type of X-ray exam that measures bone density (dual-energy X-ray absorptiometry, DEXA). This test can measure bone density in your  hips, spine, and wrists. Osteopenia has no symptoms, so this condition is usually diagnosed after a routine bone density screening test is done for osteoporosis. This routine screening is usually done for:  Women who are age 65 or older.  Men who are age 70 or older. If you have risk factors for osteopenia, you may have the screening test at an earlier age. How is this treated? Making dietary and lifestyle changes can lower your risk for osteoporosis. If you have severe osteopenia that is close to becoming osteoporosis, your health care provider may prescribe medicines and dietary supplements such as calcium and vitamin D. These supplements help to rebuild bone density. Follow these instructions at home:   Take over-the-counter and prescription medicines only as told by your health care provider. These include vitamins and supplements.  Eat a diet that is high in calcium and vitamin D. ? Calcium is found in dairy products, beans, salmon, and leafy green vegetables like spinach and broccoli. ? Look for foods that have vitamin D and calcium added to them (fortified foods), such as orange juice, cereal, and bread.  Do 30 or more minutes of a weight-bearing exercise every day, such as walking, jogging, or playing a sport. These types of exercises strengthen the bones.  Take precautions at home to lower your risk of falling, such as: ? Keeping rooms well-lit and free of clutter, such as cords. ? Installing safety rails on stairs. ? Using rubber mats in the bathroom or other areas that are often wet or slippery.  Do not use   any products that contain nicotine or tobacco, such as cigarettes and e-cigarettes. If you need help quitting, ask your health care provider.  Avoid alcohol or limit alcohol intake to no more than 1 drink a day for nonpregnant women and 2 drinks a day for men. One drink equals 12 oz of beer, 5 oz of wine, or 1 oz of hard liquor.  Keep all follow-up visits as told by your  health care provider. This is important. Contact a health care provider if:  You have not had a bone density screening for osteoporosis and you are: ? A woman, age 10 or older. ? A man, age 14 or older.  You are a postmenopausal woman who has not had a bone density screening for osteoporosis.  You are older than age 50 and you want to know if you should have bone density screening for osteoporosis. Summary  Osteopenia is a loss of thickness (density) inside of the bones. Another name for osteopenia is low bone mass.  Osteopenia is not a disease, but it may increase your risk for a condition that causes the bones to become thin and break more easily (osteoporosis).  You may be at risk for osteopenia if you are older than age 75 or if you are a woman who went through early menopause.  Osteopenia does not cause any symptoms, but it can be diagnosed with a bone density screening test.  Dietary and lifestyle changes are the first treatment for osteopenia. These may lower your risk for osteoporosis. This information is not intended to replace advice given to you by your health care provider. Make sure you discuss any questions you have with your health care provider. Document Revised: 11/18/2017 Document Reviewed: 09/14/2017 Elsevier Patient Education  [place].  Make sure you are getting daily Calcium 1,200 mg and Vit D 1,000 IU    Let's plan on getting repeat DEXA in 1-2 years.

## 2020-03-21 ENCOUNTER — Other Ambulatory Visit: Payer: Self-pay

## 2020-03-21 ENCOUNTER — Encounter: Payer: Self-pay | Admitting: Internal Medicine

## 2020-03-21 ENCOUNTER — Telehealth: Payer: Self-pay | Admitting: Radiology

## 2020-03-21 ENCOUNTER — Ambulatory Visit (INDEPENDENT_AMBULATORY_CARE_PROVIDER_SITE_OTHER): Payer: Medicare Other | Admitting: Internal Medicine

## 2020-03-21 VITALS — BP 130/60 | HR 84 | Temp 97.2°F | Ht 67.0 in | Wt 183.8 lb

## 2020-03-21 DIAGNOSIS — R002 Palpitations: Secondary | ICD-10-CM | POA: Diagnosis not present

## 2020-03-21 DIAGNOSIS — I483 Typical atrial flutter: Secondary | ICD-10-CM

## 2020-03-21 DIAGNOSIS — E785 Hyperlipidemia, unspecified: Secondary | ICD-10-CM

## 2020-03-21 NOTE — Progress Notes (Signed)
OFFICE NOTE  Chief Complaint:  Palpitations  Primary Care Physician: Eulas Post, MD  HPI:  Mary Summers  is a 86 year old female who has done generally very well. Her husband also is a patient of mine. Actually, last year she had undergone a [employer] mobile screening test which showed mild carotid artery disease and low Doppler velocities bilaterally in the carotids. She also has some increased risk for osteoporosis and dyslipidemia with an elevated total cholesterol of 212, LDL of 136 at the time. She was started on low dose pravastatin and a comprehensive lipid profile was obtained including NMR. Her NMR demonstrated actually high particle number relative to her LDL cholesterol 1736 with a calculated LDL of 148, HDL of 64 and high small LDL particle number 536. Overall, an atherogenic lipid profile. I recommended then changing to a moderate to high intensity statin, specifically atorvastatin 40 mg daily. She did make that switch, seems to be tolerating the atorvastatin without any significant myalgias or any other problems with medications. In December, she underwent left hip replacement by Dr. Wynelle Link and is doing very well with increased activity and mobility due to that. Overall not complaining of any cardiac symptoms including shortness of breath, palpitations, chest pain, presyncope or other syncopal symptoms.   No new events are noted since I last saw her. She continues to take her cholesterol medicine is due for recheck to her primary care provider in a couple of weeks. She does report some nocturnal productive cough and was told before that she may have slight overinflation of her lungs. I wonder she has some element of chronic bronchitis or asthmatic bronchitis.  Mary Summers returns today for follow-up with her husband. She reports that she's had some progressive shortness of breath which has been worsening over the past year. Particularly her symptoms are at night and she  notes increased mucus production. As we discussed previously she does have a history of COPD. This was recently presented to her primary care provider who got an x-ray of her last week. The chest x-ray demonstrates progressive interstitial lung disease as well as COPD. She also was noted to have onychomycosis of her toenails. It was recommended that she go on to Lamisil, but that she discontinue her Lipitor while on treatment with that medication.   03/18/2017  Mary Summers was seen today in follow-up. She recently had similar infection that her husband had and ultimately developed some diverticulitis. She is currently on ciprofloxacin and metronidazole. She said her symptoms are improving. In the past she has had evaluation of prominent findings on her chest x-ray consistent with COPD or possible interstitial lung disease. She also had a high-resolution CT which suggested some interstitial process however a clear diagnosis was not made. She's not currently on any treatment. She has been on statin therapy and total cholesterol is 166, HDL 78, LDL-C 73, triglycerides 75. As with her husband she does have bilateral carotid artery disease as well.  04/14/2018  Mary Summers returns today for an ER follow-up.  She had noted several episodes of transient lightheadedness and weakness, culminating in one episode when she was in church where she nearly passed out.  She came to the emergency department and was noted to be markedly tachycardic with heart rates in the 180s.  This was a narrow complex tachycardia.  The ER visit was well described and included vagal maneuvers which ultimately broke her out of her SVT.  She was then started on Toprol which  she takes and is tolerating well.  Since then she feels actually a lot better with fewer of the normal palpitations that she had and no further episodes of SVT.  03/21/2020  Mary Summers returns today for follow-up.  Recently she is describing more symptoms of palpitations.   She had undergone successful catheter ablation of atrial tachycardia on February 28, 2019.  Overall she had done well since then however recently she is described some palpitations.  Using her iPhone is demonstrated several episodes of "possible atrial fibrillation".  She is concerned that this may be some recurrence.  She and her husband are planning on leaving the country to [place] in about 1 month.  She did have recent lipids which look reasonably good with total cholesterol 175, triglycerides 80, HDL 72 and LDL of 87.  PMHx:  Past Medical History:  Diagnosis Date  . Arthritis   . Hyperlipidemia   . Peripheral vascular disease ([place])    MILD CAROTID DISEASE BY DOPPLER STUDY  11/13/12. -REPORT FROM DR. Markee Remlinger[facility]  . Seasonal allergies     Past Surgical History:  Procedure Laterality Date  . ATRIAL TACH ABLATION N/A 02/28/2019   Procedure: ATRIAL TACH ABLATION;  Surgeon: Evans Lance, MD;  Location: [facility];  Service: Cardiovascular;  Laterality: N/A;  . BREAST SURGERY     BREAST BIOPSIES X 2  - NO CANCER  . congenitally absent uterus    . DOPPLER ECHOCARDIOGRAPHY    . LASER VOCAL CORD FOR NODULE  1985    . TOTAL HIP ARTHROPLASTY  11/29/2012   Procedure: TOTAL HIP ARTHROPLASTY ANTERIOR APPROACH;  Surgeon: Gearlean Alf, MD;  Location: [facility];  Service: Orthopedics;  Laterality: Left;    FAMHx:  Family History  Problem Relation Age of Onset  . Arthritis Mother   . Heart failure Father   . Arthritis Sister   . Lung disease Neg Hx   . Rheumatologic disease Neg Hx     SOCHx:   reports that she has never smoked. She has never used smokeless tobacco. She reports current alcohol use. She reports that she does not use drugs.  ALLERGIES:  No Known Allergies  ROS: Pertinent items noted in HPI and remainder of comprehensive ROS otherwise negative.  HOME MEDS: Current Outpatient Medications  Medication Sig Dispense Refill  . amoxicillin  (AMOXIL) 500 MG capsule TAKE 4 CAPSULES BY MOUTH 1 HR BEFORE DENTAL APPT    . Ascorbic Acid (VITAMIN C PO) Take 1 tablet by mouth daily.    Marland Kitchen aspirin EC 81 MG tablet Take 81 mg by mouth daily with breakfast.    . atorvastatin (LIPITOR) 40 MG tablet TAKE 1 TABLET BY MOUTH EVERY DAY 90 tablet 3  . beta carotene 30 MG capsule Take 30 mg by mouth daily.    . calcium gluconate 500 MG tablet Take 2 tablets by mouth daily.    . ciclopirox (PENLAC) 8 % solution Apply topically at bedtime. Apply over nail and surrounding skin. Apply daily over previous coat. After seven (7) days, may remove with alcohol and continue cycle. 6.6 mL 0  . co-enzyme Q-10 30 MG capsule Take 30 mg by mouth daily.    . fluticasone (FLONASE) 50 MCG/ACT nasal spray Place 1 spray into both nostrils daily as needed for allergies or rhinitis. ONCE A DAY IF NEEDED 16 g 2  . glucosamine-chondroitin 500-400 MG tablet Take 1 tablet by mouth daily.     . Multiple Vitamin (  MULTIVITAMIN) tablet Take 1 tablet by mouth daily.     . naproxen sodium (ALEVE) 220 MG tablet Take 220 mg by mouth 2 (two) times daily as needed (pain).     . Omega-3 Fatty Acids (OMEGA 3 PO) Take 1 capsule by mouth daily.    Vladimir Faster Glycol-Propyl Glycol (LUBRICANT EYE DROPS) 0.4-0.3 % SOLN Place 1 drop into both eyes 3 (three) times daily as needed (dry/irritated eyes.).    Marland Kitchen SELENIUM PO Take 1 tablet by mouth daily.    . Turmeric 500 MG CAPS Take 1 tablet by mouth daily.    . Vitamin D, Cholecalciferol, 50 MCG (2000 UT) CAPS Take 2,000 Units by mouth daily.    . vitamin E 400 UNIT capsule Take 400 Units by mouth daily.     No current facility-administered medications for this visit.    LABS/IMAGING: No results found for this or any previous visit (from the past 48 hour(s)). No results found.  VITALS: BP 130/60   Pulse 84   Temp (!) 97.2 F (36.2 C)   Ht 5\' 7"  (1.702 m)   Wt 183 lb 12.8 oz (83.4 kg)   SpO2 95%   BMI 28.79 kg/m   EXAM: General  appearance: alert and no distress Neck: no carotid bruit, no JVD and thyroid not enlarged, symmetric, no tenderness/mass/nodules Lungs: diminished breath sounds bilaterally Heart: regular rate and rhythm Abdomen: soft, non-tender; bowel sounds normal; no masses,  no organomegaly Extremities: extremities normal, atraumatic, no cyanosis or edema Pulses: 2+ and symmetric Skin: Skin color, texture, turgor normal. No rashes or lesions Neurologic: Grossly normal  EKG: Sinus rhythm at 81 -personally reviewed  ASSESSMENT: 1. Palpitations 2. Atrial tachycardia-status post catheter ablation (02/2019) 3. CT and x-ray findings of COPD/interstitial lung disease 4. Dyslipidemia 5. Mild bilateral carotid artery disease 6. Productive nocturnal cough - likely related to COPD 7. Onychomycosis  PLAN: 1.   Mary Summers is again describing palpitations and had successful catheter ablation in March 2020.  Her iPhone had noted some episodes of irregularity concerning for "possible atrial fibrillation".  We will place a 2-week Zio patch monitor to see if we can further capture this.  She does have plans to go to the beach but could wear it there.  In addition hopefully we can get results back before they leave the country in about a month.  Follow-up with me afterwards.  Pixie Casino, MD, [facility], [place] Director of the Advanced Lipid Disorders &  Cardiovascular Risk Reduction Clinic Diplomate of the American Board of Clinical Lipidology Attending Cardiologist  Direct Dial: [phone]  Fax: [phone]  Website:  [email or web address] Khianna Blazina 03/21/2020, 10:10 PM

## 2020-03-21 NOTE — Telephone Encounter (Signed)
Enrolled patient for a 14 day Zio monitor to be mailed to patients home.  

## 2020-03-21 NOTE — Patient Instructions (Signed)
Medication Instructions:  Your physician recommends that you continue on your current medications as directed. Please refer to the Current Medication list given to you today.  *If you need a refill on your cardiac medications before your next appointment, please call your pharmacy*  Testing/Procedures: [place] Monitor Instructions   Your physician has requested you wear your ZIO patch monitor 14 days.   This is a single patch monitor.  [employer] supplies one patch monitor per enrollment.  Additional stickers are not available.   Please do not apply patch if you will be having a Nuclear Stress Test, Echocardiogram, Cardiac CT, MRI, or Chest Xray during the time frame you would be wearing the monitor. The patch cannot be worn during these tests.  You cannot remove and re-apply the ZIO XT patch monitor.   Your ZIO patch monitor will be sent USPS Priority mail from [facility] directly to your home address. The monitor may also be mailed to a PO BOX if home delivery is not available.   It may take 3-5 days to receive your monitor after you have been enrolled.   Once you have received you monitor, please review enclosed instructions.  Your monitor has already been registered assigning a specific monitor serial # to you.   Applying the monitor   Shave hair from upper left chest.   Hold abrader disc by orange tab.  Rub abrader in 40 strokes over left upper chest as indicated in your monitor instructions.   Clean area with 4 enclosed alcohol pads .  Use all pads to assure are is cleaned thoroughly.  Let dry.   Apply patch as indicated in monitor instructions.  Patch will be place under collarbone on left side of chest with arrow pointing upward.   Rub patch adhesive wings for 2 minutes.Remove white label marked "1".  Remove white label marked "2".  Rub patch adhesive wings for 2 additional minutes.   While looking in a mirror, press and release button in center of patch.  A  small green light will flash 3-4 times .  This will be your only indicator the monitor has been turned on.     Do not shower for the first 24 hours.  You may shower after the first 24 hours.   Press button if you feel a symptom. You will hear a small click.  Record Date, Time and Symptom in the Patient Log Book.   When you are ready to remove patch, follow instructions on last 2 pages of Patient Log Book.  Stick patch monitor onto last page of Patient Log Book.   Place Patient Log Book in [place] box.  Use locking tab on box and tape box closed securely.  The [employer] has [employer] on it.  Please place in mailbox as soon as possible.  Your physician should have your test results approximately 7 days after the monitor has been mailed back to [facility].   Call [place] at [phone] if you have questions regarding your ZIO XT patch monitor.  Call them immediately if you see an orange light blinking on your monitor.   If your monitor falls off in less than 4 days contact our Monitor department at [phone].  If your monitor becomes loose or falls off after 4 days call [employer] at [phone] for suggestions on securing your monitor.      Follow-Up: At [facility], you and your health needs are our priority.  As  part of our continuing mission to provide you with exceptional heart care, we have created designated Provider Care Teams.  These Care Teams include your primary Cardiologist (physician) and Advanced Practice Providers (APPs -  Physician Assistants and Nurse Practitioners) who all work together to provide you with the care you need, when you need it.  We recommend signing up for the patient portal called "MyChart".  Sign up information is provided on this After Visit Summary.  MyChart is used to connect with patients for Virtual Visits (Telemedicine).  Patients are able to view lab/test results, encounter notes, upcoming appointments, etc.   Non-urgent messages can be sent to your provider as well.   To learn more about what you can do with MyChart, go to [email or web address].    Your next appointment:   6 week(s) - or after you return from your trip  The format for your next appointment:   In Person  Provider:   Raliegh Ip Mali Hilty, MD   Other Instructions

## 2020-04-01 ENCOUNTER — Encounter: Payer: Self-pay | Admitting: Physical Therapy

## 2020-04-01 ENCOUNTER — Ambulatory Visit: Payer: Medicare Other | Attending: Family Medicine | Admitting: Physical Therapy

## 2020-04-01 ENCOUNTER — Other Ambulatory Visit: Payer: Self-pay

## 2020-04-01 DIAGNOSIS — M25561 Pain in right knee: Secondary | ICD-10-CM | POA: Diagnosis present

## 2020-04-01 DIAGNOSIS — M25562 Pain in left knee: Secondary | ICD-10-CM | POA: Insufficient documentation

## 2020-04-01 DIAGNOSIS — M6281 Muscle weakness (generalized): Secondary | ICD-10-CM | POA: Diagnosis present

## 2020-04-01 DIAGNOSIS — G8929 Other chronic pain: Secondary | ICD-10-CM

## 2020-04-01 NOTE — Patient Instructions (Signed)
Access Code: [email or web address] URL: [email or web address] Date: 04/01/2020 Prepared by: Madelyn Flavors  Exercises Sit to Stand - 2 x daily - 7 x weekly - 3 sets - 5 reps Seated Hamstring Stretch with Chair - 2 x daily - 7 x weekly - 3 reps - 1 sets - 30-60 sec hold

## 2020-04-01 NOTE — Therapy (Signed)
[facility] [address] [place], [place], [address] Phone: [phone]   Fax:  [phone]  Physical Therapy Evaluation  Patient Details  Name: Mary Summers MRN: [id number] Date of Birth: [birth year] Referring Provider (PT): Carolann Littler MD   Encounter Date: 04/01/2020  PT End of Session - 04/01/20 1106    Visit Number  1    Date for PT Re-Evaluation  04/22/20   due to pt leaving country   Authorization Type  UHC [facility]    PT Start Time  1107    PT Stop Time  1147    PT Time Calculation (min)  40 min    Activity Tolerance  Patient tolerated treatment well    Behavior During Therapy  [facility] for tasks assessed/performed       Past Medical History:  Diagnosis Date  . Arthritis   . Hyperlipidemia   . Peripheral vascular disease ([place])    MILD CAROTID DISEASE BY DOPPLER STUDY  11/13/12. -REPORT FROM [facility]  . Seasonal allergies     Past Surgical History:  Procedure Laterality Date  . ATRIAL TACH ABLATION N/A 02/28/2019   Procedure: ATRIAL TACH ABLATION;  Surgeon: Evans Lance, MD;  Location: [place] [facility];  Service: Cardiovascular;  Laterality: N/A;  . BREAST SURGERY     BREAST BIOPSIES X 2  - NO CANCER  . congenitally absent uterus    . DOPPLER ECHOCARDIOGRAPHY    . LASER VOCAL CORD FOR NODULE  1985    . TOTAL HIP ARTHROPLASTY  11/29/2012   Procedure: TOTAL HIP ARTHROPLASTY ANTERIOR APPROACH;  Surgeon: Gearlean Alf, MD;  Location: [facility];  Service: Orthopedics;  Laterality: Left;    There were no vitals filed for this visit.   Subjective Assessment - 04/01/20 1109    Subjective  Patient reports intermittent pain in bil knees Rt>Lt and general feeling of weakness in BLE. Yesterday it felt that it would give way, but she had been very busy and active. Very slow on stairs, she feels weaker than a year ago. Patient fell down stairs of deck last summer and thinks  her balance may be off due to hip replacement.    Pertinent History  osteopenia, leg length discrepancy has heel lift on right. Left THA 2013, right shoulder pain, left thumb pain, Sjorgens, OA, COPD, taccychardia, back pain    Patient Stated Goals  get stronger    Currently in Pain?  Yes    Pain Score  3     Pain Location  Knee    Pain Orientation  Right;Left    Pain Descriptors / Indicators  Aching    Pain Type  Acute pain    Pain Onset  More than a month ago    Pain Frequency  Intermittent    Aggravating Factors   walking, stairs    Pain Relieving Factors  Aleve         [facility] PT Assessment - 04/01/20 0001      Assessment   Medical Diagnosis  primary OA both knees    Referring Provider (PT)  Carolann Littler MD    Onset Date/Surgical Date  11/20/19      Precautions   Precaution Comments  left THA, osteopenia      Balance Screen   Has the patient fallen in the past 6 months  No    Has the patient had a decrease in activity level because of a  fear of falling?   No    Is the patient reluctant to leave their home because of a fear of falling?   No      Home Environment   Living Environment  Private residence    Living Arrangements  Spouse/significant other    Additional Comments  Going to [place] for 3 months on 04/20/20      Prior Function   Level of Independence  Independent    Vocation  Retired    Leisure  walking      Observation/Other Assessments   Focus on Therapeutic Outcomes (FOTO)   37% limited      Functional Tests   Functional tests  Sit to Stand      Sit to Stand   Comments  26 seconds      Posture/Postural Control   Posture Comments  even pelvis, depressed right shoulder and scapula; left knee in  flexion; stands with wt on forefeet      ROM / Strength   AROM / PROM / Strength  AROM;Strength      AROM   Overall AROM Comments  Left knee 0-127 deg; Rt knee0-130 deg      Strength   Overall Strength Comments  bil hip flex 4+/5 (poor stab with resisted  right) ABD left 4+/5 Rt 5/5, ADD 5/5; SLR difficult bil    Strength Assessment Site  Knee    Right/Left Knee  Right;Left    Right Knee Flexion  4/5    Right Knee Extension  5/5    Left Knee Flexion  5/5    Left Knee Extension  4/5      Flexibility   Soft Tissue Assessment /Muscle Length  yes    Hamstrings  bil HS tightness    Quadriceps  left quad    Piriformis  left tight      Palpation   Patella mobility  left medial glide reduced;     Palpation comment  left lateral quad insertion; right medial joint line                Objective measurements completed on examination: See above findings.              PT Education - 04/01/20 1629    Education Details  HEP    Person(s) Educated  Patient    Methods  Explanation;Demonstration;Handout    Comprehension  Verbalized understanding;Returned demonstration          PT Long Term Goals - 04/01/20 1642      PT LONG TERM GOAL #1   Title  Independent with HEP for flexibility and strengthening for BLE    Time  3    Period  Weeks    Status  New    Target Date  04/22/20      PT LONG TERM GOAL #2   Title  Decreased 5x sit to stand to <= 12 sec to decrease risk of falls    Time  3    Period  Weeks    Status  New    Target Date  04/22/20      PT LONG TERM GOAL #3   Title  decreased knee pain by >= 25% with walking    Time  3    Period  Weeks    Status  New             Plan - 04/01/20 1630    Clinical Impression Statement  Patient presents with c/o weakness  in BLE and intermittent pain in bil knees that have worsened in the past year. She notices pain and weakness mainly with stairs and walking. She has flexibility and strength deficits bil and functional weakness with sit to stand. Her 5x sit to stand is 26 sec putting her at risk for falls. Pt is leaving for a 3 month stay in [place] on 04/20/20 and is concerned due to hilly terraine in town where she stays. She would like to get stronger and get a good  HEP to take with her. She will benefit from PT to address these deficits and prevent further injury and falls.    Personal Factors and Comorbidities  Age;Comorbidity 3+;Other   limited visits   Comorbidities  osteopenia, leg length discrepancy has heel lift on right. Left THA 2013, right shoulder pain, left thumb pain, Sjorgens, OA, COPD, taccychardia, back pain    Examination-Activity Limitations  Stairs    Stability/Clinical Decision Making  Stable/Uncomplicated    Clinical Decision Making  Low    Rehab Potential  Excellent    PT Frequency  2x / week    PT Duration  3 weeks   due to pt leaving country   PT Treatment/Interventions  ADLs/Self Care Home Management;Cryotherapy;Moist Heat;Iontophoresis 4mg /ml Dexamethasone;Therapeutic exercise;Balance training;Neuromuscular re-education;Patient/family education;Manual techniques;Dry needling;Joint Manipulations    PT Next Visit Plan  Pt with limited visits: add piriformis stretch and quad stretch for HEP; knee strengthening for HEP    PT Home Exercise Plan  [email or web address]    Consulted and Agree with Plan of Care  Patient       Patient will benefit from skilled therapeutic intervention in order to improve the following deficits and impairments:  Postural dysfunction, Pain, Impaired flexibility, Decreased strength, Decreased activity tolerance  Visit Diagnosis: Muscle weakness (generalized) - Plan: PT plan of care cert/re-cert  Chronic pain of left knee - Plan: PT plan of care cert/re-cert  Chronic pain of right knee - Plan: PT plan of care cert/re-cert     Problem List Patient Active Problem List   Diagnosis Date Noted  . Atrial flutter ([place]) 02/28/2019  . PSVT (paroxysmal supraventricular tachycardia) ([place]) 04/16/2018  . Pure hypercholesterolemia 03/18/2017  . Interstitial lung disease ([place]) 12/24/2016  . Chronic seasonal allergic rhinitis 11/02/2016  . Arthritis 02/25/2016  . DOE (dyspnea on exertion) 12/30/2015  . Peripheral vascular  disease ([place]) 11/22/2014  . Mild atherosclerosis of carotid artery 11/22/2014  . OA (osteoarthritis) of hip 11/29/2012  . Hip pain 11/21/2011  . Hyperlipidemia 01/17/2009    Madelyn Flavors PT 04/01/2020, 4:50 PM  [facility] [address] [place], [place], [address] Phone: [phone]   Fax:  [phone]  Name: Mary Summers MRN: [id number] Date of Birth: 1932-05-24

## 2020-04-03 ENCOUNTER — Other Ambulatory Visit: Payer: Self-pay

## 2020-04-03 ENCOUNTER — Ambulatory Visit: Payer: Medicare Other | Admitting: Physical Therapy

## 2020-04-03 DIAGNOSIS — M6281 Muscle weakness (generalized): Secondary | ICD-10-CM

## 2020-04-03 DIAGNOSIS — G8929 Other chronic pain: Secondary | ICD-10-CM

## 2020-04-03 DIAGNOSIS — M25562 Pain in left knee: Secondary | ICD-10-CM

## 2020-04-03 NOTE — Therapy (Signed)
[facility] [address] [place], [place], [address] Phone: [phone]   Fax:  [phone]  Physical Therapy Treatment  Patient Details  Name: Mary Summers MRN: [id number] Date of Birth: 1932/02/13 Referring Provider (PT): Carolann Littler MD   Encounter Date: 04/03/2020  PT End of Session - 04/03/20 1411    Visit Number  2    Date for PT Re-Evaluation  04/22/20    Authorization Type  [employer]HC MCR    PT Start Time  [id number]    PT Stop Time  1443    PT Time Calculation (min)  38 min    Activity Tolerance  Patient tolerated treatment well    Behavior During Therapy  [facility] for tasks assessed/performed       Past Medical History:  Diagnosis Date  . Arthritis   . Hyperlipidemia   . Peripheral vascular disease ([place])    MILD CAROTID DISEASE BY DOPPLER STUDY  11/13/12. -REPORT FROM [facility]  . Seasonal allergies     Past Surgical History:  Procedure Laterality Date  . ATRIAL TACH ABLATION N/A 02/28/2019   Procedure: ATRIAL TACH ABLATION;  Surgeon: Evans Lance, MD;  Location: [place] [facility];  Service: Cardiovascular;  Laterality: N/A;  . BREAST SURGERY     BREAST BIOPSIES X 2  - NO CANCER  . congenitally absent uterus    . DOPPLER ECHOCARDIOGRAPHY    . LASER VOCAL CORD FOR NODULE  1985    . TOTAL HIP ARTHROPLASTY  11/29/2012   Procedure: TOTAL HIP ARTHROPLASTY ANTERIOR APPROACH;  Surgeon: Gearlean Alf, MD;  [facility];  Service: Orthopedics;  Laterality: Left;    There were no vitals filed for this visit.  Subjective Assessment - 04/03/20 1415    Subjective  Pt states that she had some pain afte doing the sit to stand but is now feeling better.    Pertinent History  osteopenia, leg length discrepancy has heel lift on right. Left THA 2013, right shoulder pain, left thumb pain, Sjorgens, OA, COPD, taccychardia, back pain    Patient Stated Goals  get stronger    Currently in Pain?  No/denies                       [facility] Adult PT Treatment/Exercise - 04/03/20 0001      Exercises   Exercises  Knee/Hip      Knee/Hip Exercises: Stretches   Hip Flexor Stretch  Right;Left;2 reps;30 seconds      Knee/Hip Exercises: Aerobic   Nustep  L1 x 5 min; L3x 5 min - PT present for status update      Knee/Hip Exercises: Standing   Heel Raises  Both;20 reps    Hip Flexion  Stengthening;Both;20 reps;Knee bent    Hip Flexion Limitations  2lb - tapping step finger tips for balance    Hip Abduction  Stengthening;Right;Left;20 reps;Knee straight    Abduction Limitations  2lb UE support x 2    Hip Extension  Stengthening;Right;Left;10 reps    Extension Limitations  2lb      Knee/Hip Exercises: Seated   Long Arc Quad  Strengthening;Both;2 sets;10 reps    Long Arc Quad Weight  2 lbs.    Clamshell with Marga Hoots   20x            PT Education - 04/03/20 1446    Education Details  Access Code: [email or web address]  Person(s) Educated  Patient    Methods  Explanation;Demonstration;Handout;Verbal cues;Tactile cues    Comprehension  Verbalized understanding;Returned demonstration          PT Long Term Goals - 04/01/20 1642      PT LONG TERM GOAL #1   Title  Independent with HEP for flexibility and strengthening for BLE    Time  3    Period  Weeks    Status  New    Target Date  04/22/20      PT LONG TERM GOAL #2   Title  Decreased 5x sit to stand to <= 12 sec to decrease risk of falls    Time  3    Period  Weeks    Status  New    Target Date  04/22/20      PT LONG TERM GOAL #3   Title  decreased knee pain by >= 25% with walking    Time  3    Period  Weeks    Status  New            Plan - 04/03/20 1446    Clinical Impression Statement  Pt progressed strength.  She has been doing her initial HEP at home.  HEP was updated with strengthening.  Posture reviewed and hip flexor stretch added due to patient having flexed  posture . pt will benefit from skilled PT to continue to work on strength and balance for improved funciton.    Comorbidities  osteopenia, leg length discrepancy has heel lift on right. Left THA 2013, right shoulder pain, left thumb pain, Sjorgens, OA, COPD, taccychardia, back pain    PT Treatment/Interventions  ADLs/Self Care Home Management;Cryotherapy;Moist Heat;Iontophoresis 4mg /ml Dexamethasone;Therapeutic exercise;Balance training;Neuromuscular re-education;Patient/family education;Manual techniques;Dry needling;Joint Manipulations    PT Next Visit Plan  Pt with limited visits: add piriformis stretch and quad stretch for HEP; knee strengthening for HEP    PT Home Exercise Plan  [email or web address]    Consulted and Agree with Plan of Care  Patient       Patient will benefit from skilled therapeutic intervention in order to improve the following deficits and impairments:  Postural dysfunction, Pain, Impaired flexibility, Decreased strength, Decreased activity tolerance  Visit Diagnosis: Muscle weakness (generalized)  Chronic pain of left knee  Chronic pain of right knee     Problem List Patient Active Problem List   Diagnosis Date Noted  . Atrial flutter ([place]) 02/28/2019  . PSVT (paroxysmal supraventricular tachycardia) ([place]) 04/16/2018  . Pure hypercholesterolemia 03/18/2017  . Interstitial lung disease ([place]) 12/24/2016  . Chronic seasonal allergic rhinitis 11/02/2016  . Arthritis 02/25/2016  . DOE (dyspnea on exertion) 12/30/2015  . Peripheral vascular disease ([place]) 11/22/2014  . Mild atherosclerosis of carotid artery 11/22/2014  . OA (osteoarthritis) of hip 11/29/2012  . Hip pain 11/21/2011  . Hyperlipidemia 01/17/2009    Camillo Flaming Erlean Mealor,PT 04/03/2020, 2:48 PM  [facility] [address], [place], [place], [address] Phone: [phone]   Fax:  [phone]  Name: Mary Summers MRN: [id number] Date of Birth:  [birth year]

## 2020-04-03 NOTE — Patient Instructions (Signed)
Access Code: [email or web address] URL: [email or web address] Date: 04/03/2020 Prepared by: Jari Favre  Exercises Sit to Stand - 2 x daily - 7 x weekly - 3 sets - 5 reps Seated Hamstring Stretch with Chair - 2 x daily - 7 x weekly - 3 reps - 1 sets - 30-60 sec hold Hip Flexor Stretch on Step - 1 x daily - 7 x weekly - 3 reps - 1 sets - 30 sec hold Step Up - 1 x daily - 7 x weekly - 10 reps - 3 sets Seated Hip Abduction with Resistance - 1 x daily - 7 x weekly - 10 reps - 3 sets Standing Hip Abduction with Unilateral Counter Support - 1 x daily - 7 x weekly - 10 reps - 3 sets Standing Heel Raise - 1 x daily - 7 x weekly - 10 reps - 3 sets

## 2020-04-07 ENCOUNTER — Encounter: Payer: Self-pay | Admitting: Physical Therapy

## 2020-04-07 ENCOUNTER — Other Ambulatory Visit: Payer: Self-pay

## 2020-04-07 ENCOUNTER — Ambulatory Visit: Payer: Medicare Other | Admitting: Physical Therapy

## 2020-04-07 DIAGNOSIS — M6281 Muscle weakness (generalized): Secondary | ICD-10-CM

## 2020-04-07 DIAGNOSIS — G8929 Other chronic pain: Secondary | ICD-10-CM

## 2020-04-07 DIAGNOSIS — M25562 Pain in left knee: Secondary | ICD-10-CM

## 2020-04-07 NOTE — Therapy (Signed)
[facility] [address], [place], [place], [address] Phone: [phone]   Fax:  [phone]  Physical Therapy Treatment  Patient Details  Name: Mary Summers MRN: [id number] Date of Birth: 1932/09/16 Referring Provider (PT): Carolann Littler MD   Encounter Date: 04/07/2020  PT End of Session - 04/07/20 1155    Visit Number  3    Date for PT Re-Evaluation  04/22/20    Authorization Type  UHC MCR    PT Start Time  [id number]   pt late   PT Stop Time  1227    PT Time Calculation (min)  34 min    Activity Tolerance  Patient tolerated treatment well    Behavior During Therapy  [facility] for tasks assessed/performed       Past Medical History:  Diagnosis Date  . Arthritis   . Hyperlipidemia   . Peripheral vascular disease ([place])    MILD CAROTID DISEASE BY DOPPLER STUDY  11/13/12. -REPORT FROM [facility]  . Seasonal allergies     Past Surgical History:  Procedure Laterality Date  . ATRIAL TACH ABLATION N/A 02/28/2019   Procedure: ATRIAL TACH ABLATION;  Surgeon: Evans Lance, MD;  Location: [place] [facility];  Service: Cardiovascular;  Laterality: N/A;  . BREAST SURGERY     BREAST BIOPSIES X 2  - NO CANCER  . congenitally absent uterus    . DOPPLER ECHOCARDIOGRAPHY    . LASER VOCAL CORD FOR NODULE  1985    . TOTAL HIP ARTHROPLASTY  11/29/2012   Procedure: TOTAL HIP ARTHROPLASTY ANTERIOR APPROACH;  Surgeon: Gearlean Alf, MD;  [facility];  Service: Orthopedics;  Laterality: Left;    There were no vitals filed for this visit.  Subjective Assessment - 04/07/20 1156    Subjective  i was sore after last session but it was acceptbale.    Pertinent History  osteopenia, leg length discrepancy has heel lift on right. Left THA 2013, right shoulder pain, left thumb pain, Sjorgens, OA, COPD, taccychardia, back pain                       [facility] Adult PT  Treatment/Exercise - 04/07/20 0001      Knee/Hip Exercises: Aerobic   Nustep  L3 x 6 min with discussion of cuurent status      Knee/Hip Exercises: Standing   Heel Raises  Both;1 set;20 reps    Hip Flexion  Stengthening;Both;1 set;15 reps;Knee bent    Hip Abduction  Stengthening;Both;1 set;15 reps;Knee straight    Abduction Limitations  2#    Forward Step Up  Both;1 set;10 reps;Hand Hold: 2;Step Height: 6"   light touch     Knee/Hip Exercises: Seated   Long Arc Quad  Strengthening;Both;1 set;10 reps;Weights    Long Arc Quad Weight  3 lbs.    Clamshell with [employer]  Green   15x2   Sit to Sand  2 sets;5 reps;without UE support                  PT Long Term Goals - 04/01/20 1642      PT LONG TERM GOAL #1   Title  Independent with HEP for flexibility and strengthening for BLE    Time  3    Period  Weeks    Status  New    Target Date  04/22/20      PT LONG TERM GOAL #2  Title  Decreased 5x sit to stand to <= 12 sec to decrease risk of falls    Time  3    Period  Weeks    Status  New    Target Date  04/22/20      PT LONG TERM GOAL #3   Title  decreased knee pain by >= 25% with walking    Time  3    Period  Weeks    Status  New            Plan - 04/07/20 1227    Clinical Impression Statement  Pt late for session today, limiting time available. Pt tolerated increased resisatnce for quad strength today and was still challenged by her current level with her standing exercises. PTA encouraged pt to think about her posture as she tended to round forward from her shoulder and thoracic spine.    Personal Factors and Comorbidities  Age;Comorbidity 3+;Other    Comorbidities  osteopenia, leg length discrepancy has heel lift on right. Left THA 2013, right shoulder pain, left thumb pain, Sjorgens, OA, COPD, taccychardia, back pain    Examination-Activity Limitations  Stairs    Stability/Clinical Decision Making  Stable/Uncomplicated    Rehab Potential  Excellent     PT Frequency  2x / week    PT Duration  3 weeks    PT Treatment/Interventions  ADLs/Self Care Home Management;Cryotherapy;Moist Heat;Iontophoresis 4mg /ml Dexamethasone;Therapeutic exercise;Balance training;Neuromuscular re-education;Patient/family education;Manual techniques;Dry needling;Joint Manipulations    PT Next Visit Plan  Pt with limited visits: add piriformis stretch and quad stretch for HEP; knee strengthening for HEP    PT Home Exercise Plan  [email or web address]    Consulted and Agree with Plan of Care  Patient       Patient will benefit from skilled therapeutic intervention in order to improve the following deficits and impairments:  Postural dysfunction, Pain, Impaired flexibility, Decreased strength, Decreased activity tolerance  Visit Diagnosis: Muscle weakness (generalized)  Chronic pain of left knee  Chronic pain of right knee     Problem List Patient Active Problem List   Diagnosis Date Noted  . Atrial flutter ([place]) 02/28/2019  . PSVT (paroxysmal supraventricular tachycardia) ([place]) 04/16/2018  . Pure hypercholesterolemia 03/18/2017  . Interstitial lung disease ([place]) 12/24/2016  . Chronic seasonal allergic rhinitis 11/02/2016  . Arthritis 02/25/2016  . DOE (dyspnea on exertion) 12/30/2015  . Peripheral vascular disease ([place]) 11/22/2014  . Mild atherosclerosis of carotid artery 11/22/2014  . OA (osteoarthritis) of hip 11/29/2012  . Hip pain 11/21/2011  . Hyperlipidemia 01/17/2009    Shantal Roan, PTA 04/07/2020, 12:30 PM  [facility] [address] [place], [place], [address] Phone: [phone]   Fax:  [phone]  Name: Mary Summers MRN: [id number] Date of Birth: 12-26-1931

## 2020-04-08 ENCOUNTER — Encounter: Payer: Self-pay | Admitting: Adult Health

## 2020-04-08 ENCOUNTER — Other Ambulatory Visit: Payer: Self-pay

## 2020-04-08 ENCOUNTER — Ambulatory Visit (INDEPENDENT_AMBULATORY_CARE_PROVIDER_SITE_OTHER): Payer: Medicare Other | Admitting: Adult Health

## 2020-04-08 ENCOUNTER — Ambulatory Visit (INDEPENDENT_AMBULATORY_CARE_PROVIDER_SITE_OTHER): Payer: Medicare Other

## 2020-04-08 VITALS — BP 150/70 | HR 111 | Temp 97.6°F | Ht 67.0 in | Wt 183.6 lb

## 2020-04-08 DIAGNOSIS — D869 Sarcoidosis, unspecified: Secondary | ICD-10-CM

## 2020-04-08 DIAGNOSIS — R0609 Other forms of dyspnea: Secondary | ICD-10-CM

## 2020-04-08 DIAGNOSIS — J849 Interstitial pulmonary disease, unspecified: Secondary | ICD-10-CM

## 2020-04-08 DIAGNOSIS — R0600 Dyspnea, unspecified: Secondary | ICD-10-CM

## 2020-04-08 DIAGNOSIS — R0602 Shortness of breath: Secondary | ICD-10-CM

## 2020-04-08 DIAGNOSIS — J302 Other seasonal allergic rhinitis: Secondary | ICD-10-CM

## 2020-04-08 LAB — BASIC METABOLIC PANEL
BUN: 21 mg/dL (ref 6–23)
CO2: 29 mEq/L (ref 19–32)
Calcium: 9.2 mg/dL (ref 8.4–10.5)
Chloride: 104 mEq/L (ref 96–112)
Creatinine, Ser: 0.6 mg/dL (ref 0.40–1.20)
GFR: 94.82 mL/min (ref >60.00)
Glucose, Bld: 94 mg/dL (ref 70–99)
Potassium: 4.6 mEq/L (ref 3.5–5.1)
Sodium: 139 mEq/L (ref 135–145)

## 2020-04-08 LAB — BRAIN NATRIURETIC PEPTIDE: Pro B Natriuretic peptide (BNP): 57 pg/mL (ref 0.0–100.0)

## 2020-04-08 NOTE — Patient Instructions (Signed)
Saline nasal rinses As needed   Flonase 2 puffs daily As needed   Delsym 2 tsp Twice daily  As needed  Cough .  Follow up with cardiology for heart monitor as planned.  Please contact office for sooner follow up if symptoms do not improve or worsen or seek emergency care

## 2020-04-08 NOTE — Progress Notes (Signed)
@Patient  ID: Mary Summers, female    DOB: [birth year], 86 y.o.   MRN: [id number]  Chief Complaint  Patient presents with  . 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  Spends half of her year in [place].  TEST/EVENTS :  CT chest positive for mediastinal adenopathy, calcification, lung nodule Elevated ACE level Positive rheumatoid factor and SSA with rheumatology work-up Dr. Amil Amen with no clinical evidence of connective tissue disease or Sjogren's  Previous evaluation at [facility] in [place] May 2019 repeat high-resolution CT chest showing upper lung nodules, calcified lymph nodes-assessment was that ILD is consistent with sarcoidosis no current treatment indicated  2D echo showed normal LV function, elevated RVSP  Comprehensive ILD questionnaire  09/22/2018-negative for environmental exposures, occupational exposures, medications.  She used to own a feather pillow and comforter several years ago but she got rid of it.  She assisted her first husband with woodworking, furniture making, lacquering from [phone]   High-resolution CT 03/02/2016- Hilar lymphadenopathy, mosaic attenuation with air trapping, interlobular septal thickening, upper lobe pulmonary nodules.  High-resolution CT 10/31/2018- stable hilar lymphadenopathy, upper airway septal thickening, lung nodules I have reviewed the images personally.   PFTs: 11/02/16: FVC 3.07 L (106%) FEV1 2.51 L (116%) FEV1/FVC 0.82 FEF 25-75 2.74 L (188%), DLCO corrected 69% (Hgb 15.3) 06/25/16: FVC 3.23 L (111%) FEV1 2.16 L (121%) FEV1/FVC 0.81 FEF 25-75 2.80 L (189%), DLCO corrected 62% (hemoglobin 16.2) 04/22/16: FVC 3.10 L (107%) FEV1 2.56 L (118%) FEV1/FVC 0.83 FEF 25-75 2.90 L (194%), DLCO uncorrected 62% 01/05/16: FVC 3.14 L (107%)  FEV1 2.60 L (119%) FEV1/FVC 0.83 FEF 25-75 2.95 L (196%) no bronchodilator response TLC 5.87 L (106%) RV 102% ERV 81% DLCO uncorrected 65%  6MWT 11/02/16: Walked 432 meters / Baseline Sat 98% on RA / Nadir Sat 98% on RA @ rest 06/25/16: Walked 357 meters / Baseline Sat 96% on RA / Nadir Sat 96% on RA 04/22/16: [id number] meters / Baseline Sat 96% on RA / Nadir Sat 95% on RA  Labs: 04/22/16 ACE: 72  02/25/16 ANA: Negative Anti-CCP: <16 RF: 72 SSA: 6.9 SSB: <1  Cardiac Echocardiogram [id number] LV systolic function with grade 2 diastolic dyfunction. Mild-moderated increased PA systolic pressure. Mild TR.  Data from [facility] May 2019 CBC-WBC 6.9, eos 2.5%, absolute eosinophil count [id number], metabolic panel, LFTs within normal limits. Barium swallow 05/03/2018- mild to moderate oropharyngeal dysphagia which indicates increased risk for aspiration.  CT chest high-resolution 05/02/2018-partially calcified bilateral hilar and mediastinal adenopathy and upper lung perilymphatic nodularity compatible with sarcoidosis, small hiatal hernia. CT sinus 05/02/2018- minimal ethmoid sinusitis, question right mastoiditis.  Findings suggest rhinitis.  Echocardiogram 05/02/2018-suboptimal image quality, decreased LV cavity size, LVEF 55-60%, RV is moderately enlarged.  Normal RV systolic function and diastolic function.  RVSP is moderately elevated at 42mm, IVC is increased in size suggestive of mild increased right atrial pressure.  Sleep study January 2020 - for sleep apnea  04/08/2020 Follow up : Sarcoid  Patient returns for a follow-up visit.  She was last seen November 2019.  Patient says overall she has been doing okay.  However over the last 4 weeks she has been having some episodes of shortness of breath and more frequent episodes of palpitations.  Her shortness of breath seems to happen mainly at nighttime.  She has been recording  her palpitations on her smart  phone.  Has shown several episodes of recordings of tachycardia ranging from heart rate 130-140.  Patient has had previous ablation March 2020 4 atrial tachycardia..  She is followed in cardiology with Dr. Debara Pickett .  Seen earlier this month by cardiology and recommended for a 2-week Zio patch.  Patient says she has not started this as of yet.  Patient is leaving to go out of the country early May and wants to have these symptoms evaluated before she leaves.  She will be gone for the next several months and does have medical care available at her home increase. She says she does have a chronic mild dry cough with minimum mucus production.  She says she does cough up occasionally first thing in the morning then nothing else for the rest of the day.  She denies any reflux or difficulty swallowing.  She denies any chest pain calf pain or edema.  She says she is active.  She does have some nasal stuffiness and intermittent nasal drainage. Has regular yearly ophthalmology examinations.. As above patient does have some mild to moderate pulmonary hypertension.  Sleep study was negative for sleep apnea.  Previous pulmonary function testing in November 2019 showed normal lung function with no airflow obstruction or restriction.  And DLCO was 79%. She has received both of her Covid 19 vaccinations-Moderna     No Known Allergies Immunization History  Administered Date(s) Administered  . Hepatitis A, Adult 01/02/2019  . Influenza Inj Mdck Quad Pf 09/06/2018  . Influenza Split 11/17/2011, 11/20/2012  . Influenza, High Dose Seasonal PF 11/15/2014, 12/05/2015, 11/02/2016, 10/22/2017, 09/06/2018  . Influenza,inj,Quad PF,6+ Mos 11/21/2013, 10/01/2019  . Moderna SARS-COVID-2 Vaccination 01/17/2020, 02/14/2020  . Pneumococcal Conjugate-13 11/21/2013  . Pneumococcal Polysaccharide-23 01/17/2009  . Td 05/20/1997, 01/17/2009  . Zoster 04/04/2014  . Zoster Recombinat (Shingrix) 04/22/2017    Past Medical History:    Diagnosis Date  . Arthritis   . Hyperlipidemia   . Peripheral vascular disease ([place])    MILD CAROTID DISEASE BY DOPPLER STUDY  11/13/12. -REPORT FROM [facility]  . Seasonal allergies     Tobacco History: Social History   Tobacco Use  Smoking Status Never Smoker  Smokeless Tobacco Never Used   Counseling given: Not Answered   Outpatient Medications Prior to Visit  Medication Sig Dispense Refill  . Ascorbic Acid (VITAMIN C PO) Take 1 tablet by mouth daily.    Marland Kitchen aspirin EC 81 MG tablet Take 81 mg by mouth daily with breakfast.    . atorvastatin (LIPITOR) 40 MG tablet TAKE 1 TABLET BY MOUTH EVERY DAY 90 tablet 3  . beta carotene 30 MG capsule Take 30 mg by mouth daily.    . calcium gluconate 500 MG tablet Take 2 tablets by mouth daily.    . ciclopirox (PENLAC) 8 % solution Apply topically at bedtime. Apply over nail and surrounding skin. Apply daily over previous coat. After seven (7) days, may remove with alcohol and continue cycle. 6.6 mL 0  . co-enzyme Q-10 30 MG capsule Take 30 mg by mouth daily.    . fluticasone (FLONASE) 50 MCG/ACT nasal spray Place 1 spray into both nostrils daily as needed for allergies or rhinitis. ONCE A DAY IF NEEDED 16 g 2  . glucosamine-chondroitin 500-400 MG tablet Take 1 tablet by mouth daily.     . Multiple Vitamin (MULTIVITAMIN) tablet Take 1 tablet by mouth daily.     . naproxen  sodium (ALEVE) 220 MG tablet Take 220 mg by mouth 2 (two) times daily as needed (pain).     . Omega-3 Fatty Acids (OMEGA 3 PO) Take 1 capsule by mouth daily.    Vladimir Faster Glycol-Propyl Glycol (LUBRICANT EYE DROPS) 0.4-0.3 % SOLN Place 1 drop into both eyes 3 (three) times daily as needed (dry/irritated eyes.).    Marland Kitchen SELENIUM PO Take 1 tablet by mouth daily.    . Vitamin D, Cholecalciferol, 50 MCG (2000 UT) CAPS Take 2,000 Units by mouth daily.    . vitamin E 400 UNIT capsule Take 400 Units by mouth daily.    Marland Kitchen amoxicillin (AMOXIL) 500 MG  capsule TAKE 4 CAPSULES BY MOUTH 1 HR BEFORE DENTAL APPT    . Turmeric 500 MG CAPS Take 1 tablet by mouth daily.     No facility-administered medications prior to visit.     Review of Systems:   Constitutional:   No  weight loss, night sweats,  Fevers, chills, fatigue, or  lassitude.  HEENT:   No headaches,  Difficulty swallowing,  Tooth/dental problems, or  Sore throat,                No sneezing, itching, ear ache, nasal congestion, post nasal drip,   CV:  No chest pain,  Orthopnea, PND, swelling in lower extremities, anasarca, dizziness,  syncope.   GI  No heartburn, indigestion, abdominal pain, nausea, vomiting, diarrhea, change in bowel habits, loss of appetite, bloody stools.   Resp:   No excess mucus, no productive cough,  No non-productive cough,  No coughing up of blood.  No change in color of mucus.  No wheezing.  No chest wall deformity  Skin: no rash or lesions.  GU: no dysuria, change in color of urine, no urgency or frequency.  No flank pain, no hematuria   MS:  No joint pain or swelling.  No decreased range of motion.  No back pain.    Physical Exam  BP (!) 150/70 (BP Location: Left Arm, Cuff Size: Normal)   Pulse (!) 111   Temp 97.6 F (36.4 C) (Temporal)   Ht 5\' 7"  (1.702 m)   Wt 183 lb 9.6 oz (83.3 kg)   SpO2 97%   BMI 28.76 kg/m   GEN: A/Ox3; pleasant , NAD, well nourished    HEENT:  [place]/AT, , NOSE-clear, THROAT-clear, no lesions, no postnasal drip or exudate noted.   NECK:  Supple w/ fair ROM; no JVD; normal carotid impulses w/o bruits; no thyromegaly or nodules palpated; no lymphadenopathy.    RESP  Clear  P & A; w/o, wheezes/ rales/ or rhonchi. no accessory muscle use, no dullness to percussion  CARD:  RRR, no m/r/g, no peripheral edema, pulses intact, no cyanosis or clubbing.  GI:   Soft & nt; nml bowel sounds; no organomegaly or masses detected.   Musco: Warm bil, no deformities or joint swelling noted.   Neuro: alert, no focal deficits  noted.    Skin: Warm, no lesions or rashes    Lab Results:  CBC    Component Value Date/Time   WBC 6.6 01/04/2020 1203   RBC 4.82 01/04/2020 1203   HGB 14.6 01/04/2020 1203   HCT 44.2 01/04/2020 1203   PLT 194.0 01/04/2020 1203   MCV 91.7 01/04/2020 1203   MCH 29.2 02/14/2019 1610   MCHC 33.0 01/04/2020 1203   RDW 13.1 01/04/2020 1203   LYMPHSABS 1.1 01/04/2020 1203   MONOABS 0.5 01/04/2020 1203  EOSABS 0.2 01/04/2020 1203   BASOSABS 0.0 01/04/2020 1203    BMET    Component Value Date/Time   NA 141 01/04/2020 1203   K 4.4 01/04/2020 1203   CL 102 01/04/2020 1203   CO2 29 01/04/2020 1203   GLUCOSE 93 01/04/2020 1203   BUN 12 01/04/2020 1203   CREATININE 0.65 01/04/2020 1203   CALCIUM 9.3 01/04/2020 1203   GFRNONAA 46 (L) 02/14/2019 1610   GFRAA 53 (L) 02/14/2019 1610    BNP No results found for: BNP  ProBNP No results found for: PROBNP  Imaging: No results found.    PFT Results Latest Ref Rng & Units 11/07/2018 11/02/2016 06/25/2016 04/22/2016 01/05/2016  FVC-Pre L 3.16 3.07 3.23 3.10 3.14  FVC-Predicted Pre % 112 106 111 107 107  FVC-Post L - - - - 3.10  FVC-Predicted Post % - - - - 106  Pre FEV1/FVC % % 81 82 81 83 83  Post FEV1/FCV % % - - - - 77  FEV1-Pre L 2.55 2.51 2.62 2.56 2.60  FEV1-Predicted Pre % 121 116 121 118 119  FEV1-Post L - - - - 2.38  DLCO UNC% % 79 72 67 62 65  DLCO COR %Predicted % 88 77 68 69 76  TLC L - - - - 5.87  TLC % Predicted % - - - - 106  RV % Predicted % - - - - 102    No results found for: NITRICOXIDE      Assessment & Plan:   No problem-specific Assessment & Plan notes found for this encounter.     Rexene Edison, NP 04/08/2020

## 2020-04-09 NOTE — Assessment & Plan Note (Signed)
Sarcoidosis related interstitial lung disease-chest x-ray today shows stable ILD changes.  No significant change in her daily cough.  O2 saturations are 97% on room air. Unclear her etiology for dyspnea may be related to return of her tachycardia. We will check lab work including be met and BNP.  Recent lab work reviewed in January showed no signs of anemia. On return from her trip could consider repeat spirometry with DLCO.  And as needed high-resolution CT chest.  Patient does have underlying pulmonary hypertension previous sleep study showed no significant sleep apnea.  And average SPO2 was 93%.  Could consider overnight oximetry if symptoms persist Plan  Patient Instructions  Saline nasal rinses As needed   Flonase 2 puffs daily As needed   Delsym 2 tsp Twice daily  As needed  Cough .  Follow up with cardiology for heart monitor as planned.  Please contact office for sooner follow up if symptoms do not improve or worsen or seek emergency care

## 2020-04-09 NOTE — Assessment & Plan Note (Signed)
Dyspnea questionable etiology.  Continue with cardiology evaluation with Zio patch as recommended.  Check labs today with BNP and be met.

## 2020-04-09 NOTE — Assessment & Plan Note (Signed)
Mild flare   Plan  Patient Instructions  Saline nasal rinses As needed   Flonase 2 puffs daily As needed   Delsym 2 tsp Twice daily  As needed  Cough .  Follow up with cardiology for heart monitor as planned.  Please contact office for sooner follow up if symptoms do not improve or worsen or seek emergency care

## 2020-04-10 ENCOUNTER — Encounter: Payer: Self-pay | Admitting: Physical Therapy

## 2020-04-10 ENCOUNTER — Other Ambulatory Visit: Payer: Self-pay

## 2020-04-10 ENCOUNTER — Other Ambulatory Visit (INDEPENDENT_AMBULATORY_CARE_PROVIDER_SITE_OTHER): Payer: Medicare Other

## 2020-04-10 ENCOUNTER — Ambulatory Visit: Payer: Medicare Other | Admitting: Physical Therapy

## 2020-04-10 DIAGNOSIS — R002 Palpitations: Secondary | ICD-10-CM | POA: Diagnosis not present

## 2020-04-10 DIAGNOSIS — M6281 Muscle weakness (generalized): Secondary | ICD-10-CM | POA: Diagnosis not present

## 2020-04-10 DIAGNOSIS — G8929 Other chronic pain: Secondary | ICD-10-CM

## 2020-04-10 NOTE — Therapy (Signed)
[facility] [address] [place], [place], [address] Phone: [phone]   Fax:  [phone]  Physical Therapy Treatment  Patient Details  Name: Mary Summers MRN: [id number] Date of Birth: Apr 30, 1932 Referring Provider (PT): Carolann Littler MD   Encounter Date: 04/10/2020  PT End of Session - 04/10/20 1622    Visit Number  4    Date for PT Re-Evaluation  04/22/20    Authorization Type  [employer] MCR    PT Start Time  [id number]    PT Stop Time  1657    PT Time Calculation (min)  40 min    Activity Tolerance  Patient tolerated treatment well    Behavior During Therapy  [facility] for tasks assessed/performed       Past Medical History:  Diagnosis Date  . Arthritis   . Hyperlipidemia   . Peripheral vascular disease ([place])    MILD CAROTID DISEASE BY DOPPLER STUDY  11/13/12. -REPORT FROM [facility]  . Seasonal allergies     Past Surgical History:  Procedure Laterality Date  . ATRIAL TACH ABLATION N/A 02/28/2019   Procedure: ATRIAL TACH ABLATION;  Surgeon: Evans Lance, MD;  Location: [facility];  Service: Cardiovascular;  Laterality: N/A;  . BREAST SURGERY     BREAST BIOPSIES X 2  - NO CANCER  . congenitally absent uterus    . DOPPLER ECHOCARDIOGRAPHY    . LASER VOCAL CORD FOR NODULE  1985    . TOTAL HIP ARTHROPLASTY  11/29/2012   Procedure: TOTAL HIP ARTHROPLASTY ANTERIOR APPROACH;  Surgeon: Gearlean Alf, MD;  Location: [facility];  Service: Orthopedics;  Laterality: Left;    There were no vitals filed for this visit.  Subjective Assessment - 04/10/20 1624    Subjective  I have been getting up the stairs more easily.    Pertinent History  osteopenia, leg length discrepancy has heel lift on right. Left THA 2013, right shoulder pain, left thumb pain, Sjorgens, OA, COPD, taccychardia, back pain    Patient Stated Goals  get stronger    Currently in Pain?  No/denies                        [facility] Adult PT Treatment/Exercise - 04/10/20 0001      Knee/Hip Exercises: Stretches   Active Hamstring Stretch  Both;1 rep;30 seconds    Other Knee/Hip Stretches  fwd flexion seated with SB - 10 sec each way      Knee/Hip Exercises: Aerobic   Nustep  L1 x 6 L3 x 3 min with discussion of cuurent status      Knee/Hip Exercises: Machines for Strengthening   Total Gym Leg Press  seat 9; 70lb bilat 3x 20 reps   cue to lower weight slowly back to starting position     Knee/Hip Exercises: Standing   Heel Raises  Both;1 set;20 reps    Knee Flexion  Strengthening;Both;20 reps    Knee Flexion Limitations  2lb UE support x2    Hip Flexion  Stengthening;Both;20 reps;Knee bent    Hip Flexion Limitations  2lb - tapping step finger tips for balance    Hip Abduction  Stengthening;Right;Left;20 reps;Knee straight    Abduction Limitations  2lb UE support x 2    Forward Step Up  Both;1 set;10 reps;Hand Hold: 2;Step Height: 6"   light touch  PT Long Term Goals - 04/01/20 1642      PT LONG TERM GOAL #1   Title  Independent with HEP for flexibility and strengthening for BLE    Time  3    Period  Weeks    Status  New    Target Date  04/22/20      PT LONG TERM GOAL #2   Title  Decreased 5x sit to stand to <= 12 sec to decrease risk of falls    Time  3    Period  Weeks    Status  New    Target Date  04/22/20      PT LONG TERM GOAL #3   Title  decreased knee pain by >= 25% with walking    Time  3    Period  Weeks    Status  New            Plan - 04/10/20 1702    Clinical Impression Statement  Pt did well during treatment today.  She states she is walking up steps more easily now.  Pt has flexed trunk posture and incorporated some rocking onto heels to promote stability in more upright posture.  Continue to work on [employer] and LE strength    Personal Factors and Comorbidities  Age;Comorbidity 3+;Other    Comorbidities   osteopenia, leg length discrepancy has heel lift on right. Left THA 2013, right shoulder pain, left thumb pain, Sjorgens, OA, COPD, taccychardia, back pain    PT Treatment/Interventions  ADLs/Self Care Home Management;Cryotherapy;Moist Heat;Iontophoresis 4mg /ml Dexamethasone;Therapeutic exercise;Balance training;Neuromuscular re-education;Patient/family education;Manual techniques;Dry needling;Joint Manipulations    PT Next Visit Plan  posture reduced trunk flexion; strengthening LE    PT Home Exercise Plan  [email or web address]    Consulted and Agree with Plan of Care  Patient       Patient will benefit from skilled therapeutic intervention in order to improve the following deficits and impairments:  Postural dysfunction, Pain, Impaired flexibility, Decreased strength, Decreased activity tolerance  Visit Diagnosis: Muscle weakness (generalized)  Chronic pain of left knee  Chronic pain of right knee     Problem List Patient Active Problem List   Diagnosis Date Noted  . Atrial flutter ([place]) 02/28/2019  . PSVT (paroxysmal supraventricular tachycardia) ([place]) 04/16/2018  . Pure hypercholesterolemia 03/18/2017  . Interstitial lung disease ([place]) 12/24/2016  . Chronic seasonal allergic rhinitis 11/02/2016  . Arthritis 02/25/2016  . DOE (dyspnea on exertion) 12/30/2015  . Peripheral vascular disease ([place]) 11/22/2014  . Mild atherosclerosis of carotid artery 11/22/2014  . OA (osteoarthritis) of hip 11/29/2012  . Hip pain 11/21/2011  . Hyperlipidemia 01/17/2009    Jule Ser, PT 04/10/2020, 5:08 PM  [facility] [address], [place], [place], [address] Phone: [phone]   Fax:  [phone]  Name: Mary Summers MRN: [id number] Date of Birth: 15-May-1932

## 2020-04-15 ENCOUNTER — Ambulatory Visit: Payer: Medicare Other | Admitting: Physical Therapy

## 2020-04-15 ENCOUNTER — Other Ambulatory Visit: Payer: Self-pay

## 2020-04-15 DIAGNOSIS — M25562 Pain in left knee: Secondary | ICD-10-CM

## 2020-04-15 DIAGNOSIS — M6281 Muscle weakness (generalized): Secondary | ICD-10-CM

## 2020-04-15 DIAGNOSIS — G8929 Other chronic pain: Secondary | ICD-10-CM

## 2020-04-15 NOTE — Patient Instructions (Signed)
Access Code: [email or web address] URL: [email or web address] Date: 04/15/2020 Prepared by: Jari Favre  Exercises Sit to Stand - 2 x daily - 7 x weekly - 3 sets - 5 reps Seated Hamstring Stretch with Chair - 2 x daily - 7 x weekly - 3 reps - 1 sets - 30-60 sec hold Hip Flexor Stretch on Step - 1 x daily - 7 x weekly - 3 reps - 1 sets - 30 sec hold Step Up - 1 x daily - 7 x weekly - 10 reps - 3 sets Seated Hip Abduction with Resistance - 1 x daily - 7 x weekly - 10 reps - 3 sets Standing Hip Abduction with Unilateral Counter Support - 1 x daily - 7 x weekly - 10 reps - 3 sets Standing Heel Raise - 1 x daily - 7 x weekly - 10 reps - 3 sets Supine Bridge with Resistance Band - 1 x daily - 7 x weekly - 10 reps - 3 sets

## 2020-04-15 NOTE — Therapy (Addendum)
[facility] [address], [place], [place], [address] Phone: [phone]   Fax:  [phone]  Physical Therapy Treatment  Patient Details  Name: Mary Summers MRN: [id number] Date of Birth: [birth year] Referring Provider (PT): Carolann Littler MD   Encounter Date: 04/15/2020  PT End of Session - 04/15/20 1520    Visit Number  5    Date for PT Re-Evaluation  04/22/20    Authorization Type  [employer] MCR    PT Start Time  1450    PT Stop Time  1529    PT Time Calculation (min)  39 min    Activity Tolerance  Patient tolerated treatment well    Behavior During Therapy  [facility] for tasks assessed/performed       Past Medical History:  Diagnosis Date  . Arthritis   . Hyperlipidemia   . Peripheral vascular disease ([place])    MILD CAROTID DISEASE BY DOPPLER STUDY  11/13/12. -REPORT FROM [facility]  . Seasonal allergies     Past Surgical History:  Procedure Laterality Date  . ATRIAL TACH ABLATION N/A 02/28/2019   Procedure: ATRIAL TACH ABLATION;  Surgeon: Evans Lance, MD;  Location: [facility];  Service: Cardiovascular;  Laterality: N/A;  . BREAST SURGERY     BREAST BIOPSIES X 2  - NO CANCER  . congenitally absent uterus    . DOPPLER ECHOCARDIOGRAPHY    . LASER VOCAL CORD FOR NODULE  1985    . TOTAL HIP ARTHROPLASTY  11/29/2012   Procedure: TOTAL HIP ARTHROPLASTY ANTERIOR APPROACH;  Surgeon: Gearlean Alf, MD;  Location: [facility];  Service: Orthopedics;  Laterality: Left;    There were no vitals filed for this visit.  Subjective Assessment - 04/15/20 1518    Subjective  Pt states she did well after last session and no pain. States her heart rate has been up a little today but she is good now.    Patient Stated Goals  get stronger    Currently in Pain?  No/denies                       [facility] Adult PT Treatment/Exercise - 04/15/20 0001      Lumbar Exercises:  Standing   Shoulder Extension  Strengthening;20 reps;Theraband    Theraband Level (Shoulder Extension)  Level 2 (Red)    Shoulder Extension Limitations  isometric in the other direction with TrA activation 20x - red band      Lumbar Exercises: Supine   Bent Knee Raise  20 reps;1 second    Bridge  20 reps    Bridge Limitations  red band and posterior pelvic tilt    Other Supine Lumbar Exercises  bent knee fall out - 20      Knee/Hip Exercises: Stretches   Active Hamstring Stretch  Both;1 rep;30 seconds    Hip Flexor Stretch  Right;Left;2 reps;30 seconds    Other Knee/Hip Stretches  fwd flexion seated with SB - 10 sec each way      Knee/Hip Exercises: Aerobic   Nustep  --      Knee/Hip Exercises: Machines for Strengthening   Total Gym Leg Press  --      Knee/Hip Exercises: Standing   Heel Raises  Both;1 set;20 reps   2.5 lb UE support   Knee Flexion  Strengthening;Both;20 reps    Knee Flexion Limitations  2.5 lb UE support  Hip Flexion  Stengthening;Both;20 reps;Knee bent    Hip Flexion Limitations  2.5 lb UE support    Hip Abduction  Stengthening;Right;Left;20 reps;Knee straight    Abduction Limitations  2.5 lb UE support    Forward Step Up  Both;1 set;10 reps;Hand Hold: 2;Step Height: 6"   light touch     Knee/Hip Exercises: Prone   Hip Extension  Strengthening;Both;2 sets;10 reps   2 pillows under hips            PT Education - 04/15/20 1529    Education Details  Access Code: [email or web address]    Person(s) Educated  Patient    Methods  Explanation;Demonstration;Handout;Verbal cues    Comprehension  Verbalized understanding;Returned demonstration          PT Long Term Goals - 04/01/20 1642      PT LONG TERM GOAL #1   Title  Independent with HEP for flexibility and strengthening for BLE    Time  3    Period  Weeks    Status  New    Target Date  04/22/20      PT LONG TERM GOAL #2   Title  Decreased 5x sit to stand to <= 12 sec to decrease risk of falls     Time  3    Period  Weeks    Status  New    Target Date  04/22/20      PT LONG TERM GOAL #3   Title  decreased knee pain by >= 25% with walking    Time  3    Period  Weeks    Status  New            Plan - 04/15/20 1531    Clinical Impression Statement  Pt did well with exercises today.  Today's session focused on hip extension and gluteal strength.  She is doing well with porgression of exercises and will benefit from continued skilled PT to progress towards achieving functional goals    PT Treatment/Interventions  ADLs/Self Care Home Management;Cryotherapy;Moist Heat;Iontophoresis 64m/ml Dexamethasone;Therapeutic exercise;Balance training;Neuromuscular re-education;Patient/family education;Manual techniques;Dry needling;Joint Manipulations    PT Next Visit Plan  posture reduced trunk flexion; core strength, hip extension; strengthening LE    PT Home Exercise Plan  [email or web address]    Consulted and Agree with Plan of Care  Patient       Patient will benefit from skilled therapeutic intervention in order to improve the following deficits and impairments:  Postural dysfunction, Pain, Impaired flexibility, Decreased strength, Decreased activity tolerance  Visit Diagnosis: Muscle weakness (generalized)  Chronic pain of left knee  Chronic pain of right knee     Problem List Patient Active Problem List   Diagnosis Date Noted  . Atrial flutter ([place] 02/28/2019  . PSVT (paroxysmal supraventricular tachycardia) ([place] 04/16/2018  . Pure hypercholesterolemia 03/18/2017  . Interstitial lung disease ([place] 12/24/2016  . Chronic seasonal allergic rhinitis 11/02/2016  . Arthritis 02/25/2016  . DOE (dyspnea on exertion) 12/30/2015  . Peripheral vascular disease ([place] 11/22/2014  . Mild atherosclerosis of carotid artery 11/22/2014  . OA (osteoarthritis) of hip 11/29/2012  . Hip pain 11/21/2011  . Hyperlipidemia 01/17/2009    JJule Ser PT 04/15/2020, 3:34 PM  [facility] [address] [place] [address]Phone: [phone]  Fax:  [phone] Name: Mary MCKAYMRN: [id number] of Birth: [birth year] PHYSICAL THERAPY DISCHARGE SUMMARY  Visits from Start of Care: 5  Current functional  level related to goals / functional outcomes: See above goals   Remaining deficits: See above   Education / Equipment: HEP  Plan: Patient agrees to discharge.  Patient goals were not met. Patient is being discharged due to not returning since the last visit.  ?????    [employer], PT 06/09/20 12:58 PM

## 2020-04-17 ENCOUNTER — Other Ambulatory Visit: Payer: Self-pay

## 2020-04-17 ENCOUNTER — Telehealth: Payer: Self-pay | Admitting: Internal Medicine

## 2020-04-17 MED ORDER — ATORVASTATIN CALCIUM 40 MG PO TABS: 40.0000 mg | ORAL_TABLET | Freq: Every day | ORAL | 3 refills | Status: DC

## 2020-04-17 NOTE — Telephone Encounter (Signed)
*  STAT* If patient is at the pharmacy, call can be transferred to refill team.   1. Which medications need to be refilled? (please list name of each medication and dose if known) atorvastatin (LIPITOR) 40 MG tablet  2. Which pharmacy/location (including street and city if local pharmacy) is medication to be sent to? [id number] - [place], [place] - [address]  3. Do they need a 30 day or 90 day supply? 90 day supply 

## 2020-04-22 ENCOUNTER — Encounter: Payer: Medicare Other | Admitting: Physical Therapy

## 2020-05-13 ENCOUNTER — Telehealth: Payer: Self-pay | Admitting: *Deleted

## 2020-05-13 MED ORDER — APIXABAN 5 MG PO TABS
5.0000 mg | ORAL_TABLET | Freq: Two times a day (BID) | ORAL | 3 refills | Status: DC
Start: 2020-05-13 — End: 2020-10-01

## 2020-05-13 MED ORDER — METOPROLOL SUCCINATE ER 25 MG PO TB24
25.0000 mg | ORAL_TABLET | Freq: Every day | ORAL | 3 refills | Status: DC
Start: 2020-05-13 — End: 2020-09-19

## 2020-05-13 NOTE — Telephone Encounter (Signed)
Monitor shows afib - burden about 6%, some RVR. Short run of NSVT. Would recommend starting Eliquis 5 mg BID, stop aspirin. Start Toprol XL 25 mg daily.   Dr. Luciano Cutter with pt, she is currently in [place]. Aware of monitor results. New script sent to the pharmacy here and she will discuss with a pharmacy in [place]. Follow up scheduled with dr Debara Pickett once [place]. They will call with any problems getting the medications.

## 2020-07-07 ENCOUNTER — Telehealth: Payer: Self-pay | Admitting: Family Medicine

## 2020-07-07 NOTE — Telephone Encounter (Signed)
Left message for patient to call back and schedule Medicare Annual Wellness Visit (AWV) with Nurse Health Advisor   This should be a 45 MINUTE VISIT  Last AWV 12/27/17

## 2020-07-11 NOTE — Telephone Encounter (Signed)
Pt called back and said she was in [place] and will schedule her awv appt when she returns at the end of September.

## 2020-08-20 ENCOUNTER — Telehealth: Payer: Self-pay | Admitting: Family Medicine

## 2020-08-20 NOTE — Telephone Encounter (Signed)
Left message for patient to schedule Annual Wellness Visit.  Please schedule with Nurse Health Advisor Shannon Crews, RN at  [facility]  

## 2020-08-21 NOTE — Telephone Encounter (Signed)
FYI

## 2020-09-19 ENCOUNTER — Ambulatory Visit (INDEPENDENT_AMBULATORY_CARE_PROVIDER_SITE_OTHER): Payer: Medicare Other | Admitting: Internal Medicine

## 2020-09-19 ENCOUNTER — Other Ambulatory Visit: Payer: Self-pay

## 2020-09-19 ENCOUNTER — Encounter: Payer: Self-pay | Admitting: Internal Medicine

## 2020-09-19 VITALS — BP 132/76 | HR 93 | Ht 67.0 in | Wt 173.0 lb

## 2020-09-19 DIAGNOSIS — I739 Peripheral vascular disease, unspecified: Secondary | ICD-10-CM | POA: Diagnosis not present

## 2020-09-19 DIAGNOSIS — I4891 Unspecified atrial fibrillation: Secondary | ICD-10-CM

## 2020-09-19 MED ORDER — METOPROLOL SUCCINATE ER 50 MG PO TB24: 50.0000 mg | ORAL_TABLET | Freq: Every day | ORAL | 3 refills | Status: DC

## 2020-09-19 NOTE — Patient Instructions (Signed)
Medication Instructions:  Dr. Debara Pickett has advised to stop metoprolol tartate and replace this with metoprolol succinate 50mg  once daily  *If you need a refill on your cardiac medications before your next appointment, please call your pharmacy*   Follow-Up: At [facility], you and your health needs are our priority.  As part of our continuing mission to provide you with exceptional heart care, we have created designated Provider Care Teams.  These Care Teams include your primary Cardiologist (physician) and Advanced Practice Providers (APPs -  Physician Assistants and Nurse Practitioners) who all work together to provide you with the care you need, when you need it.  We recommend signing up for the patient portal called "MyChart".  Sign up information is provided on this After Visit Summary.  MyChart is used to connect with patients for Virtual Visits (Telemedicine).  Patients are able to view lab/test results, encounter notes, upcoming appointments, etc.  Non-urgent messages can be sent to your provider as well.   To learn more about what you can do with MyChart, go to [email or web address].    Your next appointment:   6 month(s)  The format for your next appointment:   In Person  Provider:   You may see Pixie Casino, MD or one of the following Advanced Practice Providers on your designated Care Team:    Almyra Deforest, PA-C  Fabian Sharp, PA-C or   Roby Lofts, [place]    Other Instructions

## 2020-09-19 NOTE — Progress Notes (Signed)
OFFICE NOTE  Chief Complaint:  Follow-up palpitations  Primary Care Physician: Eulas Post, MD  HPI:  Mary Summers  is a 86 year old female who has done generally very well. Her husband also is a patient of mine. Actually, last year she had undergone a [employer] mobile screening test which showed mild carotid artery disease and low Doppler velocities bilaterally in the carotids. She also has some increased risk for osteoporosis and dyslipidemia with an elevated total cholesterol of 212, LDL of 136 at the time. She was started on low dose pravastatin and a comprehensive lipid profile was obtained including NMR. Her NMR demonstrated actually high particle number relative to her LDL cholesterol 1736 with a calculated LDL of 148, HDL of 64 and high small LDL particle number 536. Overall, an atherogenic lipid profile. I recommended then changing to a moderate to high intensity statin, specifically atorvastatin 40 mg daily. She did make that switch, seems to be tolerating the atorvastatin without any significant myalgias or any other problems with medications. In December, she underwent left hip replacement by Dr. Wynelle Link and is doing very well with increased activity and mobility due to that. Overall not complaining of any cardiac symptoms including shortness of breath, palpitations, chest pain, presyncope or other syncopal symptoms.   No new events are noted since I last saw her. She continues to take her cholesterol medicine is due for recheck to her primary care provider in a couple of weeks. She does report some nocturnal productive cough and was told before that she may have slight overinflation of her lungs. I wonder she has some element of chronic bronchitis or asthmatic bronchitis.  Mary Summers returns today for follow-up with her husband. She reports that she's had some progressive shortness of breath which has been worsening over the past year. Particularly her symptoms are at night  and she notes increased mucus production. As we discussed previously she does have a history of COPD. This was recently presented to her primary care provider who got an x-ray of her last week. The chest x-ray demonstrates progressive interstitial lung disease as well as COPD. She also was noted to have onychomycosis of her toenails. It was recommended that she go on to Lamisil, but that she discontinue her Lipitor while on treatment with that medication.   03/18/2017  Mary Summers was seen today in follow-up. She recently had similar infection that her husband had and ultimately developed some diverticulitis. She is currently on ciprofloxacin and metronidazole. She said her symptoms are improving. In the past she has had evaluation of prominent findings on her chest x-ray consistent with COPD or possible interstitial lung disease. She also had a high-resolution CT which suggested some interstitial process however a clear diagnosis was not made. She's not currently on any treatment. She has been on statin therapy and total cholesterol is 166, HDL 78, LDL-C 73, triglycerides 75. As with her husband she does have bilateral carotid artery disease as well.  04/14/2018  Mary Summers returns today for an ER follow-up.  She had noted several episodes of transient lightheadedness and weakness, culminating in one episode when she was in church where she nearly passed out.  She came to the emergency department and was noted to be markedly tachycardic with heart rates in the 180s.  This was a narrow complex tachycardia.  The ER visit was well described and included vagal maneuvers which ultimately broke her out of her SVT.  She was then started on Toprol  which she takes and is tolerating well.  Since then she feels actually a lot better with fewer of the normal palpitations that she had and no further episodes of SVT.  03/21/2020  Mary Summers returns today for follow-up.  Recently she is describing more symptoms of  palpitations.  She had undergone successful catheter ablation of atrial tachycardia on February 28, 2019.  Overall she had done well since then however recently she is described some palpitations.  Using her iPhone is demonstrated several episodes of "possible atrial fibrillation".  She is concerned that this may be some recurrence.  She and her husband are planning on leaving the country to [place] in about 1 month.  She did have recent lipids which look reasonably good with total cholesterol 175, triglycerides 80, HDL 72 and LDL of 87.  09/19/2020  Mary Summers is seen today for follow-up.  She was vacationing [place] when we contacted her with her monitor results showing atrial fibrillation with a burden about 6%.  I recommended starting Eliquis and Toprol-XL.  While this was not a medicine that was available she was able to get metoprolol tartrate which she has been taking a quarter of a tablet twice a day, effectively twice the dose that I had recommended.  Although she seems to be tolerating this well with good blood pressure control.  She is also on the Eliquis.  She denies any recurrent atrial fibrillation.  Overall she feels well.  EKG shows sinus rhythm today.  PMHx:  Past Medical History:  Diagnosis Date  . Arthritis   . Hyperlipidemia   . Peripheral vascular disease ([place])    MILD CAROTID DISEASE BY DOPPLER STUDY  11/13/12. -REPORT [facility]  . Seasonal allergies     Past Surgical History:  Procedure Laterality Date  . ATRIAL TACH ABLATION N/A 02/28/2019   Procedure: ATRIAL TACH ABLATION;  Surgeon: Evans Lance, MD;  Location: [facility];  Service: Cardiovascular;  Laterality: N/A;  . BREAST SURGERY     BREAST BIOPSIES X 2  - NO CANCER  . congenitally absent uterus    . DOPPLER ECHOCARDIOGRAPHY    . LASER VOCAL CORD FOR NODULE  1985    . TOTAL HIP ARTHROPLASTY  11/29/2012   Procedure: TOTAL HIP ARTHROPLASTY ANTERIOR APPROACH;  Surgeon:  Gearlean Alf, MD;  Location: [facility];  Service: Orthopedics;  Laterality: Left;    FAMHx:  Family History  Problem Relation Age of Onset  . Arthritis Mother   . Heart failure Father   . Arthritis Sister   . Lung disease Neg Hx   . Rheumatologic disease Neg Hx     SOCHx:   reports that she has never smoked. She has never used smokeless tobacco. She reports current alcohol use. She reports that she does not use drugs.  ALLERGIES:  No Known Allergies  ROS: Pertinent items noted in HPI and remainder of comprehensive ROS otherwise negative.  HOME MEDS: Current Outpatient Medications  Medication Sig Dispense Refill  . amoxicillin (AMOXIL) 500 MG capsule TAKE 4 CAPSULES BY MOUTH 1 HR BEFORE DENTAL APPT    . apixaban (ELIQUIS) 5 MG TABS tablet Take 1 tablet (5 mg total) by mouth 2 (two) times daily. 180 tablet 3  . Ascorbic Acid (VITAMIN C PO) Take 1 tablet by mouth daily.    Marland Kitchen atorvastatin (LIPITOR) 40 MG tablet Take 1 tablet (40 mg total) by mouth daily. 90 tablet 3  . beta carotene 30 MG  capsule Take 30 mg by mouth daily.    . calcium gluconate 500 MG tablet Take 2 tablets by mouth daily.    . ciclopirox (PENLAC) 8 % solution Apply topically at bedtime. Apply over nail and surrounding skin. Apply daily over previous coat. After seven (7) days, may remove with alcohol and continue cycle. 6.6 mL 0  . co-enzyme Q-10 30 MG capsule Take 30 mg by mouth daily.    . fluticasone (FLONASE) 50 MCG/ACT nasal spray Place 1 spray into both nostrils daily as needed for allergies or rhinitis. ONCE A DAY IF NEEDED 16 g 2  . glucosamine-chondroitin 500-400 MG tablet Take 1 tablet by mouth daily.     . hydrocortisone 2.5 % cream APPLY TO AFFECTED AREA AS DIRECTED    . metoprolol succinate (TOPROL XL) 25 MG 24 hr tablet Take 1 tablet (25 mg total) by mouth daily. 90 tablet 3  . Multiple Vitamin (MULTIVITAMIN) tablet Take 1 tablet by mouth daily.     . naproxen sodium (ALEVE) 220 MG tablet Take 220 mg  by mouth 2 (two) times daily as needed (pain).     . Omega-3 Fatty Acids (OMEGA 3 PO) Take 1 capsule by mouth daily.    Vladimir Faster Glycol-Propyl Glycol (LUBRICANT EYE DROPS) 0.4-0.3 % SOLN Place 1 drop into both eyes 3 (three) times daily as needed (dry/irritated eyes.).    Marland Kitchen SELENIUM PO Take 1 tablet by mouth daily.    . Vitamin D, Cholecalciferol, 50 MCG (2000 UT) CAPS Take 2,000 Units by mouth daily.    . vitamin E 400 UNIT capsule Take 400 Units by mouth daily.     No current facility-administered medications for this visit.    LABS/IMAGING: No results found for this or any previous visit (from the past 48 hour(s)). No results found.  VITALS: BP 132/76   Pulse 93   Ht 5\' 7"  (1.702 m)   Wt 173 lb (78.5 kg)   SpO2 96%   BMI 27.10 kg/m   EXAM: General appearance: alert and no distress Neck: no carotid bruit, no JVD and thyroid not enlarged, symmetric, no tenderness/mass/nodules Lungs: diminished breath sounds bilaterally Heart: regular rate and rhythm Abdomen: soft, non-tender; bowel sounds normal; no masses,  no organomegaly Extremities: extremities normal, atraumatic, no cyanosis or edema Pulses: 2+ and symmetric Skin: Skin color, texture, turgor normal. No rashes or lesions Neurologic: Grossly normal  EKG: Sinus rhythm at 93 -personally reviewed  ASSESSMENT: 1. PAF -CHADSVASC score of 4, on Eliquis 2. Atrial tachycardia-status post catheter ablation (02/2019) 3. CT and x-ray findings of COPD/interstitial lung disease 4. Dyslipidemia 5. Mild bilateral carotid artery disease 6. Productive nocturnal cough - likely related to COPD 7. Onychomycosis  PLAN: 1.   Mrs. Poulos ports good control of her A. fib on metoprolol.  She is effectively been taking 50 mg daily and we will switch her Toprol XL to 50 mg daily.  Continue Eliquis 5 mg twice daily for CHA2DS2-VASc score of 4.  She is in sinus rhythm today.  She denies recurrent palpitations.  Follow-up with me in 6 months  or sooner as necessary.  Pixie Casino, MD, [facility], [place] Director of the Advanced Lipid Disorders &  Cardiovascular Risk Reduction Clinic Diplomate of the American Board of Clinical Lipidology Attending Cardiologist  Direct Dial: [phone]  Fax: [phone]  Website:  [email or web address] Aara Jacquot 09/19/2020, 1:36 PM

## 2020-10-01 ENCOUNTER — Other Ambulatory Visit: Payer: Self-pay | Admitting: Internal Medicine

## 2020-10-01 MED ORDER — APIXABAN 5 MG PO TABS: 5.0000 mg | ORAL_TABLET | Freq: Two times a day (BID) | ORAL | 3 refills | Status: DC

## 2020-10-01 NOTE — Telephone Encounter (Signed)
Rx request sent to pharmacy.  

## 2020-10-01 NOTE — Telephone Encounter (Signed)
*  STAT* If patient is at the pharmacy, call can be transferred to refill team.   1. Which medications need to be refilled? (please list name of each medication and dose if known) apixaban (ELIQUIS) 5 MG TABS tablet   2. Which pharmacy/location (including street and city if local pharmacy) is medication to be sent to? [id number] [place], [place] - [address]   3. Do they need a 30 day or 90 day supply? 90 day  

## 2021-01-05 ENCOUNTER — Encounter: Payer: Medicare Other | Admitting: Family Medicine

## 2021-01-12 ENCOUNTER — Ambulatory Visit (INDEPENDENT_AMBULATORY_CARE_PROVIDER_SITE_OTHER): Payer: Medicare Other | Admitting: Family Medicine

## 2021-01-12 ENCOUNTER — Encounter: Payer: Medicare Other | Admitting: Family Medicine

## 2021-01-12 ENCOUNTER — Other Ambulatory Visit: Payer: Self-pay

## 2021-01-12 ENCOUNTER — Encounter: Payer: Self-pay | Admitting: Family Medicine

## 2021-01-12 VITALS — BP 128/82 | HR 97 | Ht 67.0 in | Wt 180.0 lb

## 2021-01-12 DIAGNOSIS — Z0000 Encounter for general adult medical examination without abnormal findings: Secondary | ICD-10-CM

## 2021-01-12 LAB — HEPATIC FUNCTION PANEL
ALT: 15 U/L (ref 0–35)
AST: 19 U/L (ref 0–37)
Albumin: 4.4 g/dL (ref 3.5–5.2)
Alkaline Phosphatase: 83 U/L (ref 39–117)
Bilirubin, Direct: 0.2 mg/dL (ref 0.0–0.3)
Total Bilirubin: 0.9 mg/dL (ref 0.2–1.2)
Total Protein: 7 g/dL (ref 6.0–8.3)

## 2021-01-12 LAB — HEMOGLOBIN A1C: Hgb A1c MFr Bld: 5.6 % (ref 4.6–6.5)

## 2021-01-12 LAB — BASIC METABOLIC PANEL
BUN: 14 mg/dL (ref 6–23)
CO2: 30 mEq/L (ref 19–32)
Calcium: 9.4 mg/dL (ref 8.4–10.5)
Chloride: 103 mEq/L (ref 96–112)
Creatinine, Ser: 0.61 mg/dL (ref 0.40–1.20)
GFR: 80.84 mL/min (ref >60.00)
Glucose, Bld: 90 mg/dL (ref 70–99)
Potassium: 4 mEq/L (ref 3.5–5.1)
Sodium: 140 mEq/L (ref 135–145)

## 2021-01-12 LAB — CBC WITH DIFFERENTIAL/PLATELET
Basophils Absolute: 0.1 10*3/uL (ref 0.0–0.1)
Basophils Relative: 0.8 % (ref 0.0–3.0)
Eosinophils Absolute: 0.2 10*3/uL (ref 0.0–0.7)
Eosinophils Relative: 2.6 % (ref 0.0–5.0)
HCT: 41.9 % (ref 36.0–46.0)
Hemoglobin: 14.1 g/dL (ref 12.0–15.0)
Lymphocytes Relative: 22.3 % (ref 12.0–46.0)
Lymphs Abs: 1.4 10*3/uL (ref 0.7–4.0)
MCHC: 33.7 g/dL (ref 30.0–36.0)
MCV: 90.4 fl (ref 78.0–100.0)
Monocytes Absolute: 0.6 10*3/uL (ref 0.1–1.0)
Monocytes Relative: 9.5 % (ref 3.0–12.0)
Neutro Abs: 4 10*3/uL (ref 1.4–7.7)
Neutrophils Relative %: 64.8 % (ref 43.0–77.0)
Platelets: 187 10*3/uL (ref 150.0–400.0)
RBC: 4.63 Mil/uL (ref 3.87–5.11)
RDW: 12.5 % (ref 11.5–15.5)
WBC: 6.1 10*3/uL (ref 4.0–10.5)

## 2021-01-12 LAB — LIPID PANEL
Cholesterol: 156 mg/dL (ref 0–200)
HDL: 61.7 mg/dL (ref >39.00)
LDL Cholesterol: 75 mg/dL (ref 0–99)
NonHDL: 93.84
Total CHOL/HDL Ratio: 3
Triglycerides: 95 mg/dL (ref 0.0–149.0)
VLDL: 19 mg/dL (ref 0.0–40.0)

## 2021-01-12 LAB — TSH: TSH: 2.88 u[IU]/mL (ref 0.35–4.50)

## 2021-01-12 NOTE — Progress Notes (Signed)
Established Patient Office Visit  Subjective:  Patient ID: Mary Summers, female    DOB: Aug 20, 1932  Age: 87 y.o. MRN: [id number]  CC:  Chief Complaint  Patient presents with  . Annual Exam    HPI Mary Summers presents for physical exam.  She has history of atrial flutter, interstitial lung disease, osteoarthritis, hyperlipidemia.  She had recent flare with right knee pain and had injections and this is improving some.  She has seen pulmonologist both here and in [place] with presumed diagnosis of sarcoidosis.  Symptomatically very stable.  No dyspnea with activity or at rest.  She is on Eliquis and metoprolol regarding her A. fib history.  Only rare palpitations.  No recent chest pains.  Health maintenance reviewed  -Flu vaccine received back in October -DEXA scan 3/21 showed only mild osteopenia. -She has completed Covid vaccine series including booster but we did not have date of her booster vaccine. -She is due for tetanus and will check at pharmacy regarding that -Prior Pneumovax and Prevnar given -Has received Shingrix vaccine previously -Last mammogram 3/21  Family history-her father apparently had heart failure died of complications from that possibly.  Mother had osteoarthritis.  She had sister with osteoarthritis.  She had a brother that died of complications of heart disease.  Social history-married.  She and her husband have a second home in [place] which they spend usually several months per year.  Non-smoker.  No regular alcohol use.  Retired from Hotel manager.  Past Medical History:  Diagnosis Date  . Arthritis   . Hyperlipidemia   . Peripheral vascular disease ([place])    MILD CAROTID DISEASE BY DOPPLER STUDY  11/13/12. -REPORT FROM [facility]  . Seasonal allergies     Past Surgical History:  Procedure Laterality Date  . ATRIAL TACH ABLATION N/A 02/28/2019   Procedure: ATRIAL TACH ABLATION;  Surgeon: Evans Lance, MD;  Location: [facility];  Service: Cardiovascular;  Laterality: N/A;  . BREAST SURGERY     BREAST BIOPSIES X 2  - NO CANCER  . congenitally absent uterus    . DOPPLER ECHOCARDIOGRAPHY    . LASER VOCAL CORD FOR NODULE  1985    . TOTAL HIP ARTHROPLASTY  11/29/2012   Procedure: TOTAL HIP ARTHROPLASTY ANTERIOR APPROACH;  Surgeon: Gearlean Alf, MD;  Location: [facility];  Service: Orthopedics;  Laterality: Left;    Family History  Problem Relation Age of Onset  . Arthritis Mother   . Heart failure Father   . Arthritis Sister   . Heart disease Brother   . Lung disease Neg Hx   . Rheumatologic disease Neg Hx     Social History   Socioeconomic History  . Marital status: Married    Spouse name: Not on file  . Number of children: 0  . Years of education: Not on file  . Highest education level: Not on file  Occupational History  . Occupation: retired  Tobacco Use  . Smoking status: Never Smoker  . Smokeless tobacco: Never Used  Vaping Use  . Vaping Use: Never used  Substance and Sexual Activity  . Alcohol use: Yes    Alcohol/week: 0.0 standard drinks    Comment: OCCASIONAL ALCOHOL  . Drug use: No  . Sexual activity: Not on file  Other Topics Concern  . Not on file  Social History Narrative   Originally from [place], [place]. She lived for 14 years in [place]. She  does live in the summer in [place]. Previously lived in [place]. Has previous travel to [place], [place], & [place]. 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 She reports he did work with some rare exotic woods from [place] & [place]. She does report chemical exposure to the spray booth in his shop. No bird exposure. She does have a hot tub but uses it irregularly.    Social Determinants of Health   Financial Resource Strain: Not on file  Food Insecurity: Not on file  Transportation Needs: Not on file  Physical Activity: Not on file  Stress: Not  on file  Social Connections: Not on file  Intimate Partner Violence: Not on file    Outpatient Medications Prior to Visit  Medication Sig Dispense Refill  . amoxicillin (AMOXIL) 500 MG capsule TAKE 4 CAPSULES BY MOUTH 1 HR BEFORE DENTAL APPT    . apixaban (ELIQUIS) 5 MG TABS tablet Take 1 tablet (5 mg total) by mouth 2 (two) times daily. 180 tablet 3  . Ascorbic Acid (VITAMIN C PO) Take 1 tablet by mouth daily.    Marland Kitchen atorvastatin (LIPITOR) 40 MG tablet Take 1 tablet (40 mg total) by mouth daily. 90 tablet 3  . beta carotene 30 MG capsule Take 30 mg by mouth daily.    . calcium gluconate 500 MG tablet Take 2 tablets by mouth daily.    . Calcium-Magnesium-Zinc (CAL-MAG-ZINC PO) Take by mouth.    . ciclopirox (PENLAC) 8 % solution Apply topically at bedtime. Apply over nail and surrounding skin. Apply daily over previous coat. After seven (7) days, may remove with alcohol and continue cycle. 6.6 mL 0  . co-enzyme Q-10 30 MG capsule Take 30 mg by mouth daily.    . fluticasone (FLONASE) 50 MCG/ACT nasal spray Place 1 spray into both nostrils daily as needed for allergies or rhinitis. ONCE A DAY IF NEEDED 16 g 2  . glucosamine-chondroitin 500-400 MG tablet Take 1 tablet by mouth daily.     . hydrocortisone 2.5 % cream APPLY TO AFFECTED AREA AS DIRECTED    . metoprolol succinate (TOPROL XL) 50 MG 24 hr tablet Take 1 tablet (50 mg total) by mouth daily. 90 tablet 3  . Multiple Vitamin (MULTIVITAMIN) tablet Take 1 tablet by mouth daily.    . naproxen sodium (ALEVE) 220 MG tablet Take 220 mg by mouth 2 (two) times daily as needed (pain).     . Omega-3 Fatty Acids (OMEGA 3 PO) Take 1 capsule by mouth daily.    Vladimir Faster Glycol-Propyl Glycol 0.4-0.3 % SOLN Place 1 drop into both eyes 3 (three) times daily as needed (dry/irritated eyes.).    Marland Kitchen SELENIUM PO Take 1 tablet by mouth daily.    . Vitamin D, Cholecalciferol, 50 MCG (2000 UT) CAPS Take 2,000 Units by mouth daily.    . vitamin E 400 UNIT capsule  Take 400 Units by mouth daily.     No facility-administered medications prior to visit.    No Known Allergies  ROS Review of Systems  Constitutional: Negative for activity change, appetite change, fatigue, fever and unexpected weight change.  HENT: Negative for ear pain, hearing loss, sore throat and trouble swallowing.   Eyes: Negative for visual disturbance.  Respiratory: Negative for cough and shortness of breath.   Cardiovascular: Negative for chest pain and palpitations.  Gastrointestinal: Negative for abdominal pain, blood in stool, constipation and diarrhea.  Genitourinary: Negative for dysuria  and hematuria.  Musculoskeletal: Positive for arthralgias. Negative for back pain and myalgias.  Skin: Negative for rash.  Neurological: Negative for dizziness, syncope and headaches.  Hematological: Negative for adenopathy.  Psychiatric/Behavioral: Negative for confusion and dysphoric mood.      Objective:    Physical Exam Vitals reviewed.  Constitutional:      Appearance: Normal appearance.  HENT:     Ears:     Comments: She has moderate cerumen right canal and minimal left canal Cardiovascular:     Rate and Rhythm: Normal rate and regular rhythm.  Pulmonary:     Effort: Pulmonary effort is normal.     Breath sounds: Normal breath sounds.  Abdominal:     Palpations: Abdomen is soft. There is no mass.     Tenderness: There is no abdominal tenderness.  Musculoskeletal:     Cervical back: Neck supple.     Right lower leg: No edema.     Left lower leg: No edema.  Lymphadenopathy:     Cervical: No cervical adenopathy.  Skin:    Findings: No rash.  Neurological:     General: No focal deficit present.     Mental Status: She is alert.     BP 128/82   Pulse 97   Ht 5\' 7"  (1.702 m)   Wt 180 lb (81.6 kg)   SpO2 98%   BMI 28.19 kg/m  Wt Readings from Last 3 Encounters:  01/12/21 180 lb (81.6 kg)  09/19/20 173 lb (78.5 kg)  04/08/20 183 lb 9.6 oz (83.3 kg)      Health Maintenance Due  Topic Date Due  . TETANUS/TDAP  01/17/2019  . COVID-19 Vaccine (3 - Booster for Moderna series) 08/13/2020    There are no preventive care reminders to display for this patient.  Lab Results  Component Value Date   TSH 2.52 01/04/2020   Lab Results  Component Value Date   WBC 6.6 01/04/2020   HGB 14.6 01/04/2020   HCT 44.2 01/04/2020   MCV 91.7 01/04/2020   PLT 194.0 01/04/2020   Lab Results  Component Value Date   NA 139 04/08/2020   K 4.6 04/08/2020   CO2 29 04/08/2020   GLUCOSE 94 04/08/2020   BUN 21 04/08/2020   CREATININE 0.60 04/08/2020   BILITOT 1.1 01/04/2020   ALKPHOS 83 01/04/2020   AST 21 01/04/2020   ALT 17 01/04/2020   PROT 6.8 01/04/2020   ALBUMIN 4.3 01/04/2020   CALCIUM 9.2 04/08/2020   ANIONGAP 13 02/14/2019   GFR 94.82 04/08/2020   Lab Results  Component Value Date   CHOL 175 01/04/2020   Lab Results  Component Value Date   HDL 72.20 01/04/2020   Lab Results  Component Value Date   LDLCALC 87 01/04/2020   Lab Results  Component Value Date   TRIG 80.0 01/04/2020   Lab Results  Component Value Date   CHOLHDL 2 01/04/2020   Lab Results  Component Value Date   HGBA1C 5.3 09/11/2018      Assessment & Plan:   Problem List Items Addressed This Visit   None   Visit Diagnoses    Physical exam    -  Primary   Relevant Orders   Basic metabolic panel   Lipid panel   CBC with Differential/Platelet   TSH   Hepatic function panel   Hemoglobin A1c    Patient has stable chronic issues as above.  History of A. fib/flutter.  Currently appears to be in  sinus rhythm.  -Continue with annual flu vaccine -We discussed tetanus booster and she will consider getting this at [facility] as above.  Patient requesting A1c -Continue regular weightbearing exercise -She will continue at least every other year mammogram -Has aged out of colon cancer screening  No orders of the defined types were placed  in this encounter.   Follow-up: No follow-ups on file.    Carolann Littler, MD

## 2021-01-12 NOTE — Patient Instructions (Signed)
Preventive Care 85 Years and Older, Female Preventive care refers to lifestyle choices and visits with your health care provider that can promote health and wellness. This includes:  A yearly physical exam. This is also called an annual wellness visit.  Regular dental and eye exams.  Immunizations.  Screening for certain conditions.  Healthy lifestyle choices, such as: ? Eating a healthy diet. ? Getting regular exercise. ? Not using drugs or products that contain nicotine and tobacco. ? Limiting alcohol use. What can I expect for my preventive care visit? Physical exam Your health care provider will check your:  Height and weight. These may be used to calculate your BMI (body mass index). BMI is a measurement that tells if you are at a healthy weight.  Heart rate and blood pressure.  Body temperature.  Skin for abnormal spots. Counseling Your health care provider may ask you questions about your:  Past medical problems.  Family's medical history.  Alcohol, tobacco, and drug use.  Emotional well-being.  Home life and relationship well-being.  Sexual activity.  Diet, exercise, and sleep habits.  History of falls.  Memory and ability to understand (cognition).  Work and work Statistician.  Pregnancy and menstrual history.  Access to firearms. What immunizations do I need? Vaccines are usually given at various ages, according to a schedule. Your health care provider will recommend vaccines for you based on your age, medical history, and lifestyle or other factors, such as travel or where you work.   What tests do I need? Blood tests  Lipid and cholesterol levels. These may be checked every 5 years, or more often depending on your overall health.  Hepatitis C test.  Hepatitis B test. Screening  Lung cancer screening. You may have this screening every year starting at age 74 if you have a 30-pack-year history of smoking and currently smoke or have quit within  the past 15 years.  Colorectal cancer screening. ? All adults should have this screening starting at age 44 and continuing until age 58. ? Your health care provider may recommend screening at age 2 if you are at increased risk. ? You will have tests every 1-10 years, depending on your results and the type of screening test.  Diabetes screening. ? This is done by checking your blood sugar (glucose) after you have not eaten for a while (fasting). ? You may have this done every 1-3 years.  Mammogram. ? This may be done every 1-2 years. ? Talk with your health care provider about how often you should have regular mammograms.  Abdominal aortic aneurysm (AAA) screening. You may need this if you are a current or former smoker.  BRCA-related cancer screening. This may be done if you have a family history of breast, ovarian, tubal, or peritoneal cancers. Other tests  STD (sexually transmitted disease) testing, if you are at risk.  Bone density scan. This is done to screen for osteoporosis. You may have this done starting at age 104. Talk with your health care provider about your test results, treatment options, and if necessary, the need for more tests. Follow these instructions at home: Eating and drinking  Eat a diet that includes fresh fruits and vegetables, whole grains, lean protein, and low-fat dairy products. Limit your intake of foods with high amounts of sugar, saturated fats, and salt.  Take vitamin and mineral supplements as recommended by your health care provider.  Do not drink alcohol if your health care provider tells you not to drink.  If you drink alcohol: ? Limit how much you have to 0-1 drink a day. ? Be aware of how much alcohol is in your drink. In the [place] one drink equals one 12 oz bottle of beer (355 mL), one 5 oz glass of wine (148 mL), or one 1 oz glass of hard liquor (44 mL).   Lifestyle  Take daily care of your teeth and gums. Brush your teeth every morning  and night with fluoride toothpaste. Floss one time each day.  Stay active. Exercise for at least 30 minutes 5 or more days each week.  Do not use any products that contain nicotine or tobacco, such as cigarettes, e-cigarettes, and chewing tobacco. If you need help quitting, ask your health care provider.  Do not use drugs.  If you are sexually active, practice safe sex. Use a condom or other form of protection in order to prevent STIs (sexually transmitted infections).  Talk with your health care provider about taking a low-dose aspirin or statin.  Find healthy ways to cope with stress, such as: ? Meditation, yoga, or listening to music. ? Journaling. ? Talking to a trusted person. ? Spending time with friends and family. Safety  Always wear your seat belt while driving or riding in a vehicle.  Do not drive: ? If you have been drinking alcohol. Do not ride with someone who has been drinking. ? When you are tired or distracted. ? While texting.  Wear a helmet and other protective equipment during sports activities.  If you have firearms in your house, make sure you follow all gun safety procedures. What's next?  Visit your health care provider once a year for an annual wellness visit.  Ask your health care provider how often you should have your eyes and teeth checked.  Stay up to date on all vaccines. This information is not intended to replace advice given to you by your health care provider. Make sure you discuss any questions you have with your health care provider. Document Revised: 11/26/2020 Document Reviewed: 11/30/2018 Elsevier Patient Education  2021 Elsevier Inc.  

## 2021-03-19 ENCOUNTER — Telehealth: Payer: Self-pay | Admitting: Family Medicine

## 2021-03-19 NOTE — Telephone Encounter (Signed)
Left message for patient to call back and schedule Medicare Annual Wellness Visit (AWV) either virtually or in office. No detailed message   Last AWV 01/06/18  please schedule at anytime with [facility] Nurse Health Advisor 1 or 2   This should be a 45 minute visit.

## 2021-04-09 ENCOUNTER — Other Ambulatory Visit: Payer: Self-pay

## 2021-04-09 ENCOUNTER — Ambulatory Visit (INDEPENDENT_AMBULATORY_CARE_PROVIDER_SITE_OTHER): Payer: Medicare Other

## 2021-04-09 VITALS — BP 122/68 | HR 100 | Temp 97.6°F | Wt 185.0 lb

## 2021-04-09 DIAGNOSIS — Z0000 Encounter for general adult medical examination without abnormal findings: Secondary | ICD-10-CM

## 2021-04-09 NOTE — Patient Instructions (Signed)
[name] , Thank you for taking time to come for your Medicare Wellness Visit. I appreciate your ongoing commitment to your health goals. Please review the following plan we discussed and let me know if I can assist you in the future.   Screening recommendations/referrals: Colonoscopy:  No longer due  Mammogram: No longer due  Bone Density: current  Recommended yearly ophthalmology/optometry visit for glaucoma screening and checkup Recommended yearly dental visit for hygiene and checkup  Vaccinations: Influenza vaccine: current due in fall 2022 Pneumococcal vaccine: completed series  Tdap vaccine: due upon injury  Shingles vaccine: current completed series   Advanced directives: will provide  copies   Conditions/risks identified: none   Next appointment:none    Preventive Care 15 Years and Older, Female Preventive care refers to lifestyle choices and visits with your health care provider that can promote health and wellness. What does preventive care include?  A yearly physical exam. This is also called an annual well check.  Dental exams once or twice a year.  Routine eye exams. Ask your health care provider how often you should have your eyes checked.  Personal lifestyle choices, including:  Daily care of your teeth and gums.  Regular physical activity.  Eating a healthy diet.  Avoiding tobacco and drug use.  Limiting alcohol use.  Practicing safe sex.  Taking low-dose aspirin every day.  Taking vitamin and mineral supplements as recommended by your health care provider. What happens during an annual well check? The services and screenings done by your health care provider during your annual well check will depend on your age, overall health, lifestyle risk factors, and family history of disease. Counseling  Your health care provider may ask you questions about your:  Alcohol use.  Tobacco use.  Drug use.  Emotional well-being.  Home and relationship  well-being.  Sexual activity.  Eating habits.  History of falls.  Memory and ability to understand (cognition).  Work and work Statistician.  Reproductive health. Screening  You may have the following tests or measurements:  Height, weight, and BMI.  Blood pressure.  Lipid and cholesterol levels. These may be checked every 5 years, or more frequently if you are over 23 years old.  Skin check.  Lung cancer screening. You may have this screening every year starting at age 66 if you have a 30-pack-year history of smoking and currently smoke or have quit within the past 15 years.  Fecal occult blood test (FOBT) of the stool. You may have this test every year starting at age 43.  Flexible sigmoidoscopy or colonoscopy. You may have a sigmoidoscopy every 5 years or a colonoscopy every 10 years starting at age 33.  Hepatitis C blood test.  Hepatitis B blood test.  Sexually transmitted disease (STD) testing.  Diabetes screening. This is done by checking your blood sugar (glucose) after you have not eaten for a while (fasting). You may have this done every 1-3 years.  Bone density scan. This is done to screen for osteoporosis. You may have this done starting at age 1.  Mammogram. This may be done every 1-2 years. Talk to your health care provider about how often you should have regular mammograms. Talk with your health care provider about your test results, treatment options, and if necessary, the need for more tests. Vaccines  Your health care provider may recommend certain vaccines, such as:  Influenza vaccine. This is recommended every year.  Tetanus, diphtheria, and acellular pertussis (Tdap, Td) vaccine. You may need  a Td booster every 10 years.  Zoster vaccine. You may need this after age 37.  Pneumococcal 13-valent conjugate (PCV13) vaccine. One dose is recommended after age 79.  Pneumococcal polysaccharide (PPSV23) vaccine. One dose is recommended after age  33. Talk to your health care provider about which screenings and vaccines you need and how often you need them. This information is not intended to replace advice given to you by your health care provider. Make sure you discuss any questions you have with your health care provider. Document Released: 01/02/2016 Document Revised: 08/25/2016 Document Reviewed: 10/07/2015 Elsevier Interactive Patient Education  2017 [place] Prevention in the Home Falls can cause injuries. They can happen to people of all ages. There are many things you can do to make your home safe and to help prevent falls. What can I do on the outside of my home?  Regularly fix the edges of walkways and driveways and fix any cracks.  Remove anything that might make you trip as you walk through a door, such as a raised step or threshold.  Trim any bushes or trees on the path to your home.  Use bright outdoor lighting.  Clear any walking paths of anything that might make someone trip, such as rocks or tools.  Regularly check to see if handrails are loose or broken. Make sure that both sides of any steps have handrails.  Any raised decks and porches should have guardrails on the edges.  Have any leaves, snow, or ice cleared regularly.  Use sand or salt on walking paths during winter.  Clean up any spills in your garage right away. This includes oil or grease spills. What can I do in the bathroom?  Use night lights.  Install grab bars by the toilet and in the tub and shower. Do not use towel bars as grab bars.  Use non-skid mats or decals in the tub or shower.  If you need to sit down in the shower, use a plastic, non-slip stool.  Keep the floor dry. Clean up any water that spills on the floor as soon as it happens.  Remove soap buildup in the tub or shower regularly.  Attach bath mats securely with double-sided non-slip rug tape.  Do not have throw rugs and other things on the floor that can make  you trip. What can I do in the bedroom?  Use night lights.  Make sure that you have a light by your bed that is easy to reach.  Do not use any sheets or blankets that are too big for your bed. They should not hang down onto the floor.  Have a firm chair that has side arms. You can use this for support while you get dressed.  Do not have throw rugs and other things on the floor that can make you trip. What can I do in the kitchen?  Clean up any spills right away.  Avoid walking on wet floors.  Keep items that you use a lot in easy-to-reach places.  If you need to reach something above you, use a strong step stool that has a grab bar.  Keep electrical cords out of the way.  Do not use floor polish or wax that makes floors slippery. If you must use wax, use non-skid floor wax.  Do not have throw rugs and other things on the floor that can make you trip. What can I do with my stairs?  Do not leave any items on the  stairs.  Make sure that there are handrails on both sides of the stairs and use them. Fix handrails that are broken or loose. Make sure that handrails are as long as the stairways.  Check any carpeting to make sure that it is firmly attached to the stairs. Fix any carpet that is loose or worn.  Avoid having throw rugs at the top or bottom of the stairs. If you do have throw rugs, attach them to the floor with carpet tape.  Make sure that you have a light switch at the top of the stairs and the bottom of the stairs. If you do not have them, ask someone to add them for you. What else can I do to help prevent falls?  Wear shoes that:  Do not have high heels.  Have rubber bottoms.  Are comfortable and fit you well.  Are closed at the toe. Do not wear sandals.  If you use a stepladder:  Make sure that it is fully opened. Do not climb a closed stepladder.  Make sure that both sides of the stepladder are locked into place.  Ask someone to hold it for you, if  possible.  Clearly mark and make sure that you can see:  Any grab bars or handrails.  First and last steps.  Where the edge of each step is.  Use tools that help you move around (mobility aids) if they are needed. These include:  Canes.  Walkers.  Scooters.  Crutches.  Turn on the lights when you go into a dark area. Replace any light bulbs as soon as they burn out.  Set up your furniture so you have a clear path. Avoid moving your furniture around.  If any of your floors are uneven, fix them.  If there are any pets around you, be aware of where they are.  Review your medicines with your doctor. Some medicines can make you feel dizzy. This can increase your chance of falling. Ask your doctor what other things that you can do to help prevent falls. This information is not intended to replace advice given to you by your health care provider. Make sure you discuss any questions you have with your health care provider. Document Released: 10/02/2009 Document Revised: 05/13/2016 Document Reviewed: 01/10/2015 Elsevier Interactive Patient Education  2017 [employer].

## 2021-04-09 NOTE — Progress Notes (Signed)
Subjective:   Mary Summers is a 87 y.o. female who presents for Medicare Annual (Subsequent) preventive examination.  Review of Systems    n/a Cardiac Risk Factors include: advanced age (>71men, >74 women);hypertension;dyslipidemia     Objective:    Today's Vitals   04/09/21 1447  BP: 122/68  Pulse: 100  Temp: 97.6 F (36.4 C)  SpO2: 99%  Weight: 185 lb (83.9 kg)   Body mass index is 28.98 kg/m.  Advanced Directives 04/09/2021 04/01/2020 02/28/2019 02/14/2019 12/26/2018 04/02/2018 11/29/2012  Does Patient Have a Medical Advance Directive? Yes Yes Yes No Yes Yes Patient has advance directive, copy not in chart  Type of Advance Directive Living will;Healthcare Power of [place];Living will [place];Living will - [place];Living will [place];Living will [place];Living will  Does patient want to make changes to medical advance directive? No - Patient declined No - Patient declined No - Patient declined - No - Patient declined - -  Copy of [place] in Chart? No - copy requested - Yes - validated most recent copy scanned in chart (See row information) - No - copy requested No - copy requested Copy requested from family  Would patient like information on creating a medical advance directive? - - No - Patient declined No - Patient declined - - -  Pre-existing out of facility DNR order (yellow form or pink MOST form) - - - - - - No    Current Medications (verified) Outpatient Encounter Medications as of 04/09/2021  Medication Sig  . amoxicillin (AMOXIL) 500 MG capsule TAKE 4 CAPSULES BY MOUTH 1 HR BEFORE DENTAL APPT  . apixaban (ELIQUIS) 5 MG TABS tablet Take 1 tablet (5 mg total) by mouth 2 (two) times daily.  . Ascorbic Acid (VITAMIN C PO) Take 1 tablet by mouth daily.  Marland Kitchen atorvastatin (LIPITOR) 40 MG tablet Take 1 tablet (40 mg total) by mouth daily.  .  beta carotene 30 MG capsule Take 30 mg by mouth daily.  . calcium gluconate 500 MG tablet Take 2 tablets by mouth daily.  . Calcium-Magnesium-Zinc (CAL-MAG-ZINC PO) Take by mouth.  . ciclopirox (PENLAC) 8 % solution Apply topically at bedtime. Apply over nail and surrounding skin. Apply daily over previous coat. After seven (7) days, may remove with alcohol and continue cycle.  Marland Kitchen co-enzyme Q-10 30 MG capsule Take 30 mg by mouth daily.  . fluticasone (FLONASE) 50 MCG/ACT nasal spray Place 1 spray into both nostrils daily as needed for allergies or rhinitis. ONCE A DAY IF NEEDED  . glucosamine-chondroitin 500-400 MG tablet Take 1 tablet by mouth daily.   . hydrocortisone 2.5 % cream APPLY TO AFFECTED AREA AS DIRECTED  . metoprolol succinate (TOPROL XL) 50 MG 24 hr tablet Take 1 tablet (50 mg total) by mouth daily.  . Multiple Vitamin (MULTIVITAMIN) tablet Take 1 tablet by mouth daily.  . Omega-3 Fatty Acids (OMEGA 3 PO) Take 1 capsule by mouth daily.  Vladimir Faster Glycol-Propyl Glycol 0.4-0.3 % SOLN Place 1 drop into both eyes 3 (three) times daily as needed (dry/irritated eyes.).  Marland Kitchen SELENIUM PO Take 1 tablet by mouth daily.  . Vitamin D, Cholecalciferol, 50 MCG (2000 UT) CAPS Take 2,000 Units by mouth daily.  . vitamin E 400 UNIT capsule Take 400 Units by mouth daily.  . naproxen sodium (ALEVE) 220 MG tablet Take 220 mg by mouth 2 (two) times daily as needed (  pain).  (Patient not taking: Reported on 04/09/2021)   No facility-administered encounter medications on file as of 04/09/2021.    Allergies (verified) Patient has no known allergies.   History: Past Medical History:  Diagnosis Date  . Arthritis   . Hyperlipidemia   . Peripheral vascular disease ([place])    MILD CAROTID DISEASE BY DOPPLER STUDY  11/13/12. -REPORT FROM [facility]  . Seasonal allergies    Past Surgical History:  Procedure Laterality Date  . ATRIAL TACH ABLATION N/A 02/28/2019    Procedure: ATRIAL TACH ABLATION;  Surgeon: Evans Lance, MD;  Location: [facility];  Service: Cardiovascular;  Laterality: N/A;  . BREAST SURGERY     BREAST BIOPSIES X 2  - NO CANCER  . congenitally absent uterus    . DOPPLER ECHOCARDIOGRAPHY    . LASER VOCAL CORD FOR NODULE  1985    . TOTAL HIP ARTHROPLASTY  11/29/2012   Procedure: TOTAL HIP ARTHROPLASTY ANTERIOR APPROACH;  Surgeon: Gearlean Alf, MD;  Location: [facility];  Service: Orthopedics;  Laterality: Left;   Family History  Problem Relation Age of Onset  . Arthritis Mother   . Heart failure Father   . Arthritis Sister   . Heart disease Brother   . Lung disease Neg Hx   . Rheumatologic disease Neg Hx    Social History   Socioeconomic History  . Marital status: Married    Spouse name: Not on file  . Number of children: 0  . Years of education: Not on file  . Highest education level: Not on file  Occupational History  . Occupation: retired  Tobacco Use  . Smoking status: Never Smoker  . Smokeless tobacco: Never Used  Vaping Use  . Vaping Use: Never used  Substance and Sexual Activity  . Alcohol use: Yes    Alcohol/week: 0.0 standard drinks    Comment: OCCASIONAL ALCOHOL  . Drug use: No  . Sexual activity: Not on file  Other Topics Concern  . Not on file  Social History Narrative   Originally from [place], [place]. She lived for 14 years in [place]. She does live in the summer in [place]. Previously lived in [place]. Has previous travel to [place], [place], & [place]. 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 She reports he did work with some rare exotic woods from [place] & [place]. She does report chemical exposure to the spray booth in his shop. No bird exposure. She does have a hot tub but uses it irregularly.    Social Determinants of Health   Financial Resource Strain: Low Risk   . Difficulty of Paying Living Expenses: Not hard at all  Food  Insecurity: No Food Insecurity  . Worried About Charity fundraiser in the Last Year: Never true  . Ran Out of Food in the Last Year: Never true  Transportation Needs: No Transportation Needs  . Lack of Transportation (Medical): No  . Lack of Transportation (Non-Medical): No  Physical Activity: Insufficiently Active  . Days of Exercise per Week: 3 days  . Minutes of Exercise per Session: 30 min  Stress: No Stress Concern Present  . Feeling of Stress : Not at all  Social Connections: Moderately Integrated  . Frequency of Communication with Friends and Family: More than three times a week  . Frequency of Social Gatherings with Friends and Family: More than three times a week  .  Attends Religious Services: 1 to 4 times per year  . Active Member of Clubs or Organizations: No  . Attends Archivist Meetings: Never  . Marital Status: Married    Tobacco Counseling Counseling given: Not Answered   Clinical Intake:  Pre-visit preparation completed: Yes  Pain : No/denies pain     Nutritional Risks: None Diabetes: No  How often do you need to have someone help you when you read instructions, pamphlets, or other written materials from your doctor or pharmacy?: 1 - Never What is the last grade level you completed in school?: master  Diabetic?no   Interpreter Needed?: No  Information entered by :: [place] of Daily Living In your present state of health, do you have any difficulty performing the following activities: 04/09/2021 01/12/2021  Hearing? N Y  Vision? N N  Difficulty concentrating or making decisions? N N  Walking or climbing stairs? N N  Dressing or bathing? N N  Doing errands, shopping? N N  Preparing Food and eating ? N -  Using the Toilet? N -  In the past six months, have you accidently leaked urine? N -  Do you have problems with loss of bowel control? N -  Managing your Medications? N -  Managing your Finances? N -  Housekeeping or  managing your Housekeeping? N -  Some recent data might be hidden    Patient Care Team: Eulas Post, MD as PCP - General (Family Medicine) Debara Pickett Nadean Corwin, MD as PCP - Cardiology (Cardiology)  Indicate any recent Medical Services you may have received from other than [facility] providers in the past year (date may be approximate).     Assessment:   This is a routine wellness examination for [place].  Hearing/Vision screen  Hearing Screening   125Hz  250Hz  500Hz  1000Hz  2000Hz  3000Hz  4000Hz  6000Hz  8000Hz   Right ear:           Left ear:           Vision Screening Comments: Annual eye exam   Dietary issues and exercise activities discussed: Current Exercise Habits: Home exercise routine, Type of exercise: walking, Time (Minutes): 30, Frequency (Times/Week): 3, Weekly Exercise (Minutes/Week): 90, Intensity: Mild, Exercise limited by: None identified  Goals    . Exercise 3x per week (30 min per time)      Depression Screen PHQ 2/9 Scores 04/09/2021 04/09/2021 03/09/2017 12/24/2016 12/24/2015 12/12/2014  PHQ - 2 Score 0 0 0 0 0 0    Fall Risk Fall Risk  04/09/2021 01/12/2021 01/04/2020 03/09/2017 12/24/2016  Falls in the past year? 0 0 1 No Yes  Number falls in past yr: 0 - 0 - 1  Injury with Fall? 0 - 0 - No  Follow up Falls evaluation completed - - - -    FALL RISK PREVENTION PERTAINING TO THE HOME:  Any stairs in or around the home? Yes  If so, are there any without handrails? Yes  Home free of loose throw rugs in walkways, pet beds, electrical cords, etc? Yes  Adequate lighting in your home to reduce risk of falls? Yes   ASSISTIVE DEVICES UTILIZED TO PREVENT FALLS:  Life alert? Yes  Use of a cane, walker or w/c? Yes  Grab bars in the bathroom? Yes  Shower chair or bench in shower? Yes  Elevated toilet seat or a handicapped toilet? Yes   TIMED UP AND GO:  Was the test performed? Yes .  Length of time to ambulate  10 feet: 6 sec.   Gait steady and fast without use of  assistive device  Cognitive Function: Normal cognitive status assessed by direct observation by this Nurse Health Advisor. No abnormalities found.          Immunizations Immunization History  Administered Date(s) Administered  . Hepatitis A, Adult 01/02/2019  . Influenza Inj Mdck Quad Pf 09/06/2018  . Influenza Split 11/17/2011, 11/20/2012  . Influenza, High Dose Seasonal PF 11/15/2014, 12/05/2015, 11/02/2016, 10/22/2017, 09/06/2018  . Influenza,inj,Quad PF,6+ Mos 11/21/2013, 10/01/2019  . Influenza,inj,quad, With Preservative 09/06/2018  . Influenza-Unspecified 09/24/2020  . Moderna Sars-Covid-2 Vaccination 01/17/2020, 02/14/2020  . Pneumococcal Conjugate-13 11/21/2013  . Pneumococcal Polysaccharide-23 01/17/2009  . Td 05/20/1997, 01/17/2009  . Zoster 04/04/2014  . Zoster Recombinat (Shingrix) 04/22/2017    TDAP status: Due, Education has been provided regarding the importance of this vaccine. Advised may receive this vaccine at local pharmacy or Health Dept. Aware to provide a copy of the vaccination record if obtained from local pharmacy or Health Dept. Verbalized acceptance and understanding.  Flu Vaccine status: Up to date  Pneumococcal vaccine status: Up to date  Covid-19 vaccine status: Completed vaccines  Qualifies for Shingles Vaccine? Yes   Zostavax completed Yes   Shingrix Completed?: No.    Education has been provided regarding the importance of this vaccine. Patient has been advised to call insurance company to determine out of pocket expense if they have not yet received this vaccine. Advised may also receive vaccine at local pharmacy or Health Dept. Verbalized acceptance and understanding.  Screening Tests Health Maintenance  Topic Date Due  . TETANUS/TDAP  01/17/2019  . COVID-19 Vaccine (3 - Booster for Moderna series) 08/13/2020  . INFLUENZA VACCINE  07/20/2021  . DEXA SCAN  Completed  . PNA vac Low Risk Adult  Completed  . HPV VACCINES  Aged Out     Health Maintenance  Health Maintenance Due  Topic Date Due  . TETANUS/TDAP  01/17/2019  . COVID-19 Vaccine (3 - Booster for Moderna series) 08/13/2020    Colorectal cancer screening: No longer required.   Mammogram status: No longer required due to age.  Bone Density status: Completed 02/29/2020. Results reflect: Bone density results: OSTEOPENIA. Repeat every 3 years.  Lung Cancer Screening: (Low Dose CT Chest recommended if Age 63-80 years, 30 pack-year currently smoking OR have quit w/in 15years.) does not qualify.   Lung Cancer Screening Referral: n/a   Additional Screening:  Hepatitis C Screening: does not qualify;  Vision Screening: Recommended annual ophthalmology exams for early detection of glaucoma and other disorders of the eye. Is the patient up to date with their annual eye exam?  Yes  Who is the provider or what is the name of the office in which the patient attends annual eye exams? Dr. [place]  If pt is not established with a provider, would they like to be referred to a provider to establish care? No .   Dental Screening: Recommended annual dental exams for proper oral hygiene  Community Resource Referral / Chronic Care Management: CRR required this visit?  No   CCM required this visit?  No      Plan:     I have personally reviewed and noted the following in the patient's chart:   . Medical and social history . Use of alcohol, tobacco or illicit drugs  . Current medications and supplements . Functional ability and status . Nutritional status . Physical activity . Advanced directives . List of other physicians .  Hospitalizations, surgeries, and ER visits in previous 12 months . Vitals . Screenings to include cognitive, depression, and falls . Referrals and appointments  In addition, I have reviewed and discussed with patient certain preventive protocols, quality metrics, and best practice recommendations. A written personalized care plan for  preventive services as well as general preventive health recommendations were provided to patient.     Randel Pigg, LPN   0/02/7047   Nurse Notes: none

## 2021-06-25 ENCOUNTER — Other Ambulatory Visit: Payer: Self-pay | Admitting: Internal Medicine

## 2021-06-25 MED ORDER — ATORVASTATIN CALCIUM 40 MG PO TABS: 40.0000 mg | ORAL_TABLET | Freq: Every day | ORAL | 3 refills | Status: DC

## 2021-06-25 NOTE — Telephone Encounter (Signed)
Prescription refill request for Eliquis received. Indication: Atrial Fib Last office visit: 09/19/21  Hilty MD Scr: 0.61 on 01/12/21 Age: 87 Weight: 78.5kg  Based on above findings Eliquis 5mg  twice daily is the appropriate dose.  Refill approved.  Due for appt with Dr Debara Pickett 10/22.  Pt needs BMP/CBC at that time.  Put in recall notes.

## 2021-08-24 ENCOUNTER — Other Ambulatory Visit: Payer: Self-pay

## 2021-08-24 ENCOUNTER — Encounter (HOSPITAL_COMMUNITY): Payer: Self-pay

## 2021-08-24 ENCOUNTER — Ambulatory Visit (HOSPITAL_COMMUNITY)
Admission: EM | Admit: 2021-08-24 | Discharge: 2021-08-24 | Disposition: A | Discharge status: Home / Self Care | Payer: Medicare Other | Attending: Physician Assistant | Admitting: Physician Assistant

## 2021-08-24 DIAGNOSIS — U071 COVID-19: Secondary | ICD-10-CM | POA: Diagnosis not present

## 2021-08-24 DIAGNOSIS — R059 Cough, unspecified: Secondary | ICD-10-CM | POA: Diagnosis not present

## 2021-08-24 MED ORDER — BENZONATATE 100 MG PO CAPS: 100.0000 mg | ORAL_CAPSULE | Freq: Three times a day (TID) | ORAL | 0 refills | Status: DC

## 2021-08-24 NOTE — Discharge Instructions (Addendum)
Can use Flonase daily Recommend Coricidin and Mucinex Drink plenty of fluids, rest  Isolate for 5 days from symptom onset and then wear a mask around others for 5 days.

## 2021-08-24 NOTE — ED Provider Notes (Signed)
[place]    CSN: [id number] Arrival date & time: 08/24/21  0911      History   Chief Complaint Chief Complaint  Patient presents with   Cough    + COVID test (home)    HPI Mary Summers is a 87 y.o. female.   Pt complains of cough that started about 4 days ago after traveling to [place].  Also complains fo nasal congestion, this has gradually improved since onset.  Pt tested positive for COVID yesterday day.  She has a home O2 monitor and has been checking O2 sats which have remained over 95%.  She is taking azithromycin, started 4 days ago.  Denies fever, chills, shortness of breath.  She has h/o COPD.  She did have her COVID vaccine and booster.    Past Medical History:  Diagnosis Date   Arthritis    Hyperlipidemia    Peripheral vascular disease ([place])    MILD CAROTID DISEASE BY DOPPLER STUDY  11/13/12. -REPORT FROM [facility]   Seasonal allergies     Patient Active Problem List   Diagnosis Date Noted   Atrial flutter ([place]) 02/28/2019   PSVT (paroxysmal supraventricular tachycardia) ([place]) 04/16/2018   Pure hypercholesterolemia 03/18/2017   Interstitial lung disease ([place]) 12/24/2016   Chronic seasonal allergic rhinitis 11/02/2016   Arthritis 02/25/2016   DOE (dyspnea on exertion) 12/30/2015   Peripheral vascular disease ([place]) 11/22/2014   Mild atherosclerosis of carotid artery 11/22/2014   OA (osteoarthritis) of hip 11/29/2012   Hip pain 11/21/2011   Hyperlipidemia 01/17/2009    Past Surgical History:  Procedure Laterality Date   ATRIAL TACH ABLATION N/A 02/28/2019   Procedure: ATRIAL TACH ABLATION;  Surgeon: Evans Lance, MD;  Location: [facility];  Service: Cardiovascular;  Laterality: N/A;   BREAST SURGERY     BREAST BIOPSIES X 2  - NO CANCER   congenitally absent uterus     DOPPLER ECHOCARDIOGRAPHY     LASER VOCAL CORD FOR NODULE  1985     TOTAL HIP ARTHROPLASTY  11/29/2012   Procedure: TOTAL HIP  ARTHROPLASTY ANTERIOR APPROACH;  Surgeon: Gearlean Alf, MD;  Location: [facility];  Service: Orthopedics;  Laterality: Left;    OB History   No obstetric history on file.      Home Medications    Prior to Admission medications   Medication Sig Start Date End Date Taking? Authorizing Provider  amoxicillin (AMOXIL) 500 MG capsule TAKE 4 CAPSULES BY MOUTH 1 HR BEFORE DENTAL APPT 11/13/19   [provider]  apixaban (ELIQUIS) 5 MG TABS tablet TAKE 1 TABLET BY MOUTH TWICE A DAY 06/25/21   Hilty, Nadean Corwin, MD  Ascorbic Acid (VITAMIN C PO) Take 1 tablet by mouth daily.    [provider]  atorvastatin (LIPITOR) 40 MG tablet Take 1 tablet (40 mg total) by mouth daily. 06/25/21   Hilty, Nadean Corwin, MD  beta carotene 30 MG capsule Take 30 mg by mouth daily.    [provider]  calcium gluconate 500 MG tablet Take 2 tablets by mouth daily.    [provider]  Calcium-Magnesium-Zinc (CAL-MAG-ZINC PO) Take by mouth.    [provider]  ciclopirox (PENLAC) 8 % solution Apply topically at bedtime. Apply over nail and surrounding skin. Apply daily over previous coat. After seven (7) days, may remove with alcohol and continue cycle. 11/21/13   Marletta Lor, MD  co-enzyme Q-10 30 MG capsule Take  30 mg by mouth daily.    [provider]  fluticasone (FLONASE) 50 MCG/ACT nasal spray Place 1 spray into both nostrils daily as needed for allergies or rhinitis. ONCE A DAY IF NEEDED 11/21/13   Marletta Lor, MD  glucosamine-chondroitin 500-400 MG tablet Take 1 tablet by mouth daily.     [provider]  hydrocortisone 2.5 % cream APPLY TO AFFECTED AREA AS DIRECTED 03/28/20   [provider]  metoprolol succinate (TOPROL-XL) 50 MG 24 hr tablet TAKE 1 TABLET BY MOUTH EVERY DAY 06/25/21   Hilty, Nadean Corwin, MD  Multiple Vitamin (MULTIVITAMIN) tablet Take 1 tablet by mouth daily.    [provider]  naproxen sodium (ALEVE) 220 MG  tablet Take 220 mg by mouth 2 (two) times daily as needed (pain).  Patient not taking: Reported on 04/09/2021    [provider]  Omega-3 Fatty Acids (OMEGA 3 PO) Take 1 capsule by mouth daily.    [provider]  Polyethyl Glycol-Propyl Glycol 0.4-0.3 % SOLN Place 1 drop into both eyes 3 (three) times daily as needed (dry/irritated eyes.).    [provider]  SELENIUM PO Take 1 tablet by mouth daily.    [provider]  Vitamin D, Cholecalciferol, 50 MCG (2000 UT) CAPS Take 2,000 Units by mouth daily.    [provider]  vitamin E 400 UNIT capsule Take 400 Units by mouth daily.    [provider]    Family History Family History  Problem Relation Age of Onset   Arthritis Mother    Heart failure Father    Arthritis Sister    Heart disease Brother    Lung disease Neg Hx    Rheumatologic disease Neg Hx     Social History Social History   Tobacco Use   Smoking status: Never   Smokeless tobacco: Never  Vaping Use   Vaping Use: Never used  Substance Use Topics   Alcohol use: Yes    Alcohol/week: 0.0 standard drinks    Comment: OCCASIONAL ALCOHOL   Drug use: No     Allergies   Patient has no known allergies.   Review of Systems Review of Systems  Constitutional:  Negative for chills and fever.  HENT:  Positive for congestion. Negative for ear pain and sore throat.   Eyes:  Negative for pain and visual disturbance.  Respiratory:  Positive for cough. Negative for shortness of breath.   Cardiovascular:  Negative for chest pain and palpitations.  Gastrointestinal:  Negative for abdominal pain and vomiting.  Genitourinary:  Negative for dysuria and hematuria.  Musculoskeletal:  Negative for arthralgias and back pain.  Skin:  Negative for color change and rash.  Neurological:  Negative for seizures and syncope.  All other systems reviewed and are negative.   Physical Exam Triage Vital Signs ED Triage Vitals  Enc Vitals  Group     BP 08/24/21 0945 120/77     Pulse Rate 08/24/21 0945 80     Resp 08/24/21 0945 18     Temp 08/24/21 0945 99 F (37.2 C)     Temp Source 08/24/21 0945 Oral     SpO2 08/24/21 0945 96 %     Weight --      Height --      Head Circumference --      Peak Flow --      Pain Score 08/24/21 0942 0     Pain Loc --  Pain Edu? --      Excl. in [place]? --    No data found.  Updated Vital Signs BP 120/77 (BP Location: Left Arm)   Pulse 80   Temp 99 F (37.2 C) (Oral)   Resp 18   SpO2 96%   Visual Acuity Right Eye Distance:   Left Eye Distance:   Bilateral Distance:    Right Eye Near:   Left Eye Near:    Bilateral Near:     Physical Exam Vitals and nursing note reviewed.  Constitutional:      General: She is not in acute distress.    Appearance: She is well-developed.  HENT:     Head: Normocephalic and atraumatic.  Eyes:     Conjunctiva/sclera: Conjunctivae normal.  Cardiovascular:     Rate and Rhythm: Normal rate and regular rhythm.     Heart sounds: No murmur heard. Pulmonary:     Effort: Pulmonary effort is normal. No respiratory distress.     Breath sounds: Normal breath sounds.  Abdominal:     Palpations: Abdomen is soft.     Tenderness: There is no abdominal tenderness.  Musculoskeletal:     Cervical back: Neck supple.  Skin:    General: Skin is warm and dry.  Neurological:     Mental Status: She is alert.     UC Treatments / Results  Labs (all labs ordered are listed, but only abnormal results are displayed) Labs Reviewed - No data to display  EKG   Radiology No results found.  Procedures Procedures (including critical care time)  Medications Ordered in UC Medications - No data to display  Initial Impression / Assessment and Plan / UC Course  I have reviewed the triage vital signs and the nursing notes.  Pertinent labs & imaging results that were available during my care of the patient were reviewed by me and considered in my medical  decision making (see chart for details).     Pt well appearing, in no acute distress. Vitals wnl.  Advised continued supportive care. Return precautions discussed.  Final Clinical Impressions(s) / UC Diagnoses   Final diagnoses:  None   Discharge Instructions   None    ED Prescriptions   None    PDMP not reviewed this encounter.   Ward, Lenise Arena, PA-C 08/24/21 1058

## 2021-08-24 NOTE — ED Triage Notes (Signed)
Pt reports + COVID test (home), yesterday. Pt reports cough x 3 days. Pt started azithromycin 3 days ago.

## 2021-08-31 ENCOUNTER — Telehealth: Payer: Self-pay | Admitting: Internal Medicine

## 2021-08-31 NOTE — Telephone Encounter (Signed)
*  STAT* If patient is at the pharmacy, call can be transferred to refill team.   1. Which medications need to be refilled? (please list name of each medication and dose if known) atorvastatin (LIPITOR) 40 MG tablet  2. Which pharmacy/location (including street and city if local pharmacy) is medication to be sent to? [id number] [place], [place] - [address]. Do they need a 30 day or 90 day supply? 90 day supply

## 2021-09-01 MED ORDER — ATORVASTATIN CALCIUM 40 MG PO TABS: 40.0000 mg | ORAL_TABLET | Freq: Every day | ORAL | 3 refills | Status: DC

## 2021-09-22 ENCOUNTER — Telehealth: Payer: Self-pay | Admitting: Family Medicine

## 2021-09-22 MED ORDER — AMOXICILLIN 500 MG PO CAPS: ORAL_CAPSULE | ORAL | 0 refills | Status: DC

## 2021-09-22 NOTE — Telephone Encounter (Signed)
Rx sent. Spoke with the patient, she is aware.

## 2021-09-22 NOTE — Telephone Encounter (Signed)
Patient needs to get amoxicillin (AMOXIL) 500 MG capsule [[id number]] in order to get procedure.  [id number] - [place], [place]  [address], [place] [address] .  Patient could be contacted at [phone].  Please advise.

## 2021-11-06 ENCOUNTER — Telehealth: Payer: Self-pay | Admitting: Family Medicine

## 2021-11-06 NOTE — Telephone Encounter (Signed)
Patient is having a dental procedure. Please advise.

## 2021-11-06 NOTE — Telephone Encounter (Signed)
Pt call and want a refill on Amoxicillin [facility] to [id number] - [place], [place] Phone:  [phone]  Fax:  [phone]

## 2021-11-08 ENCOUNTER — Encounter: Payer: Self-pay | Admitting: Family Medicine

## 2021-11-09 MED ORDER — AMOXICILLIN 500 MG PO CAPS: ORAL_CAPSULE | ORAL | 0 refills | Status: DC

## 2021-11-09 NOTE — Telephone Encounter (Signed)
Noted. Patient sent in a mychart message. Medication has been sent in and mychart message sent to patient

## 2021-12-16 ENCOUNTER — Other Ambulatory Visit: Payer: Self-pay | Admitting: Family Medicine

## 2021-12-16 DIAGNOSIS — Z1231 Encounter for screening mammogram for malignant neoplasm of breast: Secondary | ICD-10-CM

## 2022-01-08 ENCOUNTER — Ambulatory Visit
Admission: RE | Admit: 2022-01-08 | Discharge: 2022-01-08 | Disposition: A | Discharge status: Home / Self Care | Payer: Medicare Other | Source: Ambulatory Visit | Attending: Family Medicine | Admitting: Family Medicine

## 2022-01-08 DIAGNOSIS — Z1231 Encounter for screening mammogram for malignant neoplasm of breast: Secondary | ICD-10-CM

## 2022-01-15 ENCOUNTER — Ambulatory Visit (INDEPENDENT_AMBULATORY_CARE_PROVIDER_SITE_OTHER): Payer: Medicare Other | Admitting: Family Medicine

## 2022-01-15 VITALS — BP 138/80 | HR 85 | Temp 98.4°F | Ht 67.0 in | Wt 175.8 lb

## 2022-01-15 DIAGNOSIS — Z0000 Encounter for general adult medical examination without abnormal findings: Secondary | ICD-10-CM

## 2022-01-15 LAB — LIPID PANEL
Cholesterol: 164 mg/dL (ref 0–200)
HDL: 77.2 mg/dL (ref >39.00)
LDL Cholesterol: 76 mg/dL (ref 0–99)
NonHDL: 86.68
Total CHOL/HDL Ratio: 2
Triglycerides: 51 mg/dL (ref 0.0–149.0)
VLDL: 10.2 mg/dL (ref 0.0–40.0)

## 2022-01-15 LAB — BASIC METABOLIC PANEL
BUN: 19 mg/dL (ref 6–23)
CO2: 34 mEq/L — ABNORMAL HIGH (ref 19–32)
Calcium: 9.2 mg/dL (ref 8.4–10.5)
Chloride: 103 mEq/L (ref 96–112)
Creatinine, Ser: 0.64 mg/dL (ref 0.40–1.20)
GFR: 79.35 mL/min (ref >60.00)
Glucose, Bld: 71 mg/dL (ref 70–99)
Potassium: 4.2 mEq/L (ref 3.5–5.1)
Sodium: 141 mEq/L (ref 135–145)

## 2022-01-15 LAB — HEPATIC FUNCTION PANEL
ALT: 20 U/L (ref 0–35)
AST: 21 U/L (ref 0–37)
Albumin: 4.2 g/dL (ref 3.5–5.2)
Alkaline Phosphatase: 79 U/L (ref 39–117)
Bilirubin, Direct: 0.1 mg/dL (ref 0.0–0.3)
Total Bilirubin: 0.7 mg/dL (ref 0.2–1.2)
Total Protein: 6.8 g/dL (ref 6.0–8.3)

## 2022-01-15 LAB — CBC WITH DIFFERENTIAL/PLATELET
Basophils Absolute: 0 10*3/uL (ref 0.0–0.1)
Basophils Relative: 0.5 % (ref 0.0–3.0)
Eosinophils Absolute: 0.1 10*3/uL (ref 0.0–0.7)
Eosinophils Relative: 2 % (ref 0.0–5.0)
HCT: 44.3 % (ref 36.0–46.0)
Hemoglobin: 14.4 g/dL (ref 12.0–15.0)
Lymphocytes Relative: 15.4 % (ref 12.0–46.0)
Lymphs Abs: 1 10*3/uL (ref 0.7–4.0)
MCHC: 32.5 g/dL (ref 30.0–36.0)
MCV: 92.5 fl (ref 78.0–100.0)
Monocytes Absolute: 0.6 10*3/uL (ref 0.1–1.0)
Monocytes Relative: 8.5 % (ref 3.0–12.0)
Neutro Abs: 4.8 10*3/uL (ref 1.4–7.7)
Neutrophils Relative %: 73.6 % (ref 43.0–77.0)
Platelets: 183 10*3/uL (ref 150.0–400.0)
RBC: 4.79 Mil/uL (ref 3.87–5.11)
RDW: 13.3 % (ref 11.5–15.5)
WBC: 6.5 10*3/uL (ref 4.0–10.5)

## 2022-01-15 LAB — TSH: TSH: 2.63 u[IU]/mL (ref 0.35–5.50)

## 2022-01-15 NOTE — Patient Instructions (Signed)
We had date of Shingrix vaccine 04-22-17.      Confirm if you had second dose.
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
## 2022-01-18 LAB — HEMOGLOBIN A1C: Hgb A1c MFr Bld: 5.7 % (ref 4.6–6.5)

## 2022-02-26 ENCOUNTER — Ambulatory Visit
Admission: RE | Admit: 2022-02-26 | Discharge: 2022-02-26 | Disposition: A | Discharge status: Home / Self Care | Payer: Medicare Other | Source: Ambulatory Visit | Attending: Family Medicine | Admitting: Family Medicine

## 2022-03-02 ENCOUNTER — Other Ambulatory Visit: Payer: Self-pay

## 2022-03-02 ENCOUNTER — Encounter (HOSPITAL_COMMUNITY): Payer: Self-pay | Admitting: *Deleted

## 2022-03-02 ENCOUNTER — Emergency Department (HOSPITAL_COMMUNITY)
Admission: EM | Admit: 2022-03-02 | Discharge: 2022-03-02 | Disposition: A | Discharge status: Home / Self Care | Payer: Medicare Other | Attending: Emergency Medicine | Admitting: Emergency Medicine

## 2022-03-02 ENCOUNTER — Emergency Department (HOSPITAL_COMMUNITY): Payer: Medicare Other

## 2022-03-02 DIAGNOSIS — R911 Solitary pulmonary nodule: Secondary | ICD-10-CM | POA: Diagnosis not present

## 2022-03-02 DIAGNOSIS — S20212A Contusion of left front wall of thorax, initial encounter: Secondary | ICD-10-CM | POA: Insufficient documentation

## 2022-03-02 DIAGNOSIS — Z79899 Other long term (current) drug therapy: Secondary | ICD-10-CM | POA: Insufficient documentation

## 2022-03-02 DIAGNOSIS — S299XXA Unspecified injury of thorax, initial encounter: Secondary | ICD-10-CM | POA: Diagnosis present

## 2022-03-02 DIAGNOSIS — Y92410 Unspecified street and highway as the place of occurrence of the external cause: Secondary | ICD-10-CM | POA: Diagnosis not present

## 2022-03-02 DIAGNOSIS — E041 Nontoxic single thyroid nodule: Secondary | ICD-10-CM | POA: Insufficient documentation

## 2022-03-02 DIAGNOSIS — R7309 Other abnormal glucose: Secondary | ICD-10-CM | POA: Insufficient documentation

## 2022-03-02 DIAGNOSIS — Z7901 Long term (current) use of anticoagulants: Secondary | ICD-10-CM | POA: Insufficient documentation

## 2022-03-02 DIAGNOSIS — I4892 Unspecified atrial flutter: Secondary | ICD-10-CM | POA: Insufficient documentation

## 2022-03-02 LAB — CBC WITH DIFFERENTIAL/PLATELET
Abs Immature Granulocytes: 0.02 K/uL (ref 0.00–0.07)
Basophils Absolute: 0.1 K/uL (ref 0.0–0.1)
Basophils Relative: 1 %
Eosinophils Absolute: 0.2 K/uL (ref 0.0–0.5)
Eosinophils Relative: 3 %
HCT: 43.9 % (ref 36.0–46.0)
Hemoglobin: 14.5 g/dL (ref 12.0–15.0)
Immature Granulocytes: 0 %
Lymphocytes Relative: 19 %
Lymphs Abs: 1.4 K/uL (ref 0.7–4.0)
MCH: 30.7 pg (ref 26.0–34.0)
MCHC: 33 g/dL (ref 30.0–36.0)
MCV: 92.8 fL (ref 80.0–100.0)
Monocytes Absolute: 0.7 K/uL (ref 0.1–1.0)
Monocytes Relative: 10 %
Neutro Abs: 4.9 K/uL (ref 1.7–7.7)
Neutrophils Relative %: 67 %
Platelets: 201 K/uL (ref 150–400)
RBC: 4.73 MIL/uL (ref 3.87–5.11)
RDW: 12.6 % (ref 11.5–15.5)
WBC: 7.2 K/uL (ref 4.0–10.5)
nRBC: 0 % (ref 0.0–0.2)

## 2022-03-02 LAB — BASIC METABOLIC PANEL WITH GFR
Anion gap: 6 (ref 5–15)
BUN: 20 mg/dL (ref 8–23)
CO2: 30 mmol/L (ref 22–32)
Calcium: 9 mg/dL (ref 8.9–10.3)
Chloride: 101 mmol/L (ref 98–111)
Creatinine, Ser: 0.68 mg/dL (ref 0.44–1.00)
GFR, Estimated: >60 mL/min
Glucose, Bld: 118 mg/dL — ABNORMAL HIGH (ref 70–99)
Potassium: 4.1 mmol/L (ref 3.5–5.1)
Sodium: 137 mmol/L (ref 135–145)

## 2022-03-02 MED ORDER — IOHEXOL 300 MG/ML  SOLN
80.0000 mL | Freq: Once | INTRAMUSCULAR | Status: AC | PRN
  Administered 2022-03-02: 100 mL via INTRAVENOUS

## 2022-03-02 MED ORDER — ACETAMINOPHEN 325 MG PO TABS
650.0000 mg | ORAL_TABLET | Freq: Once | ORAL | Status: AC
Start: 2022-03-02 — End: 2022-03-02
  Administered 2022-03-02: 650 mg via ORAL
  Filled 2022-03-02: qty 2
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
## 2022-03-11 ENCOUNTER — Encounter: Payer: Self-pay | Admitting: Family Medicine

## 2022-03-11 DIAGNOSIS — R221 Localized swelling, mass and lump, neck: Secondary | ICD-10-CM

## 2022-03-12 ENCOUNTER — Ambulatory Visit: Payer: Medicare Other | Admitting: Internal Medicine

## 2022-03-12 ENCOUNTER — Other Ambulatory Visit: Payer: Self-pay

## 2022-03-12 ENCOUNTER — Encounter: Payer: Self-pay | Admitting: Internal Medicine

## 2022-03-12 VITALS — BP 167/90 | HR 87 | Ht 66.0 in | Wt 182.0 lb

## 2022-03-12 DIAGNOSIS — I4891 Unspecified atrial fibrillation: Secondary | ICD-10-CM | POA: Diagnosis not present

## 2022-03-12 DIAGNOSIS — E785 Hyperlipidemia, unspecified: Secondary | ICD-10-CM

## 2022-03-12 DIAGNOSIS — R030 Elevated blood-pressure reading, without diagnosis of hypertension: Secondary | ICD-10-CM
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
## 2022-03-12 NOTE — Patient Instructions (Signed)
Medication Instructions:  ?Your physician recommends that you continue on your current medications as directed. Please refer to the Current Medication list given to you today. ? ?*If you need a refill on your cardiac medications before your next appointment, please call your pharmacy* ? ? ?Follow-Up: ?At [facility], you and your health needs are our priority.  As part of our continuing mission to provide you with exceptional heart care, we have created designated Provider Care Teams.  These Care Teams include your primary Cardiologist (physician) and Advanced Practice Providers (APPs -  Physician Assistants and Nurse Practitioners) who all work together to provide you with the care you need, when you need it. ? ?We recommend signing up for the patient portal called "MyChart".  Sign up information is provided on this After Visit Summary.  MyChart is used to connect with patients for Virtual Visits (Telemedicine).  Patients are able to view lab/test results, encounter notes, upcoming appointments, etc.  Non-urgent messages can be sent to your provider as well.   ?To learn more about what you can do with MyChart, go to [email or web address].   ? ?Your next appointment:   ?12 month(s) ? ?The format for your next appointment:   ?In Person ? ?Provider:   ?Pixie Casino, MD { ? ? ?Other Instructions ? ?Please monitor BP at home ? ?HOW TO TAKE YOUR BLOOD PRESSURE: ?Rest 5 minutes before taking your blood pressure. ?Don?t smoke or drink caffeinated beverages for at least 30 minutes before. ?Take your blood pressure before (not after) you eat. ?Sit comfortably with your back supported and both feet on the floor (don?t cross your legs). ?Elevate your arm to heart level on a table or a desk. ?Use the proper sized cuff. It should fit smoothly and snugly around your bare upper arm. There should be enough room to slip a fingertip under the cuff. The bottom edge of the cuff should be 1 inch above the crease of the  elbow. ?Ideally, take 3 measurements at one sitting and record the average. ? ?

## 2022-03-12 NOTE — Telephone Encounter (Signed)
Mammogram was normal and they recommended one year follow up.   I have ordered neck ultrasound to further evaluate the 1.5 cm neck nodule.   ?

## 2022-03-17 ENCOUNTER — Emergency Department (HOSPITAL_COMMUNITY): Payer: Medicare Other

## 2022-03-17 ENCOUNTER — Emergency Department (HOSPITAL_COMMUNITY)
Admission: EM | Admit: 2022-03-17 | Discharge: 2022-03-17 | Disposition: A | Discharge status: Home / Self Care | Payer: Medicare Other | Attending: Emergency Medicine | Admitting: Emergency Medicine

## 2022-03-17 ENCOUNTER — Encounter (HOSPITAL_COMMUNITY): Payer: Self-pay

## 2022-03-17 ENCOUNTER — Other Ambulatory Visit: Payer: Self-pay

## 2022-03-17 DIAGNOSIS — R109 Unspecified abdominal pain: Secondary | ICD-10-CM

## 2022-03-17 DIAGNOSIS — Z7901 Long term (current) use of anticoagulants: Secondary | ICD-10-CM | POA: Diagnosis not present

## 2022-03-17 DIAGNOSIS — W109XXA Fall (on) (from) unspecified stairs and steps, initial encounter: Secondary | ICD-10-CM | POA: Insufficient documentation

## 2022-03-17 DIAGNOSIS — R1031 Right lower quadrant pain: Secondary | ICD-10-CM | POA: Insufficient documentation

## 2022-03-17 DIAGNOSIS — S0990XA Unspecified injury of head, initial encounter: Secondary | ICD-10-CM | POA: Insufficient documentation

## 2022-03-17 DIAGNOSIS — W19XXXA Unspecified fall, initial encounter: Secondary | ICD-10-CM

## 2022-03-17 LAB — COMPREHENSIVE METABOLIC PANEL
ALT: 22 U/L (ref 0–44)
AST: 27 U/L (ref 15–41)
Albumin: 3.9 g/dL (ref 3.5–5.0)
Alkaline Phosphatase: 75 U/L (ref 38–126)
Anion gap: 8 (ref 5–15)
BUN: 14 mg/dL (ref 8–23)
CO2: 28 mmol/L (ref 22–32)
Calcium: 9.1 mg/dL (ref 8.9–10.3)
Chloride: 103 mmol/L (ref 98–111)
Creatinine, Ser: 0.62 mg/dL (ref 0.44–1.00)
GFR, Estimated: >60 mL/min (ref >60)
Glucose, Bld: 118 mg/dL — ABNORMAL HIGH (ref 70–99)
Potassium: 4.1 mmol/L (ref 3.5–5.1)
Sodium: 139 mmol/L (ref 135–145)
Total Bilirubin: 0.9 mg/dL (ref 0.3–1.2)
Total Protein: 6.5 g/dL (ref 6.5–8.1)

## 2022-03-17 LAB — PROTIME-INR
INR: 1.1 (ref 0.8–1.2)
Prothrombin Time: 14.6 seconds (ref 11.4–15.2)

## 2022-03-17 LAB — CBC
HCT: 42.8 % (ref 36.0–46.0)
Hemoglobin: 14.2 g/dL (ref 12.0–15.0)
MCH: 30.7 pg (ref 26.0–34.0)
MCHC: 33.2 g/dL (ref 30.0–36.0)
MCV: 92.4 fL (ref 80.0–100.0)
Platelets: 175 10*3/uL (ref 150–400)
RBC: 4.63 MIL/uL (ref 3.87–5.11)
RDW: 12.2 % (ref 11.5–15.5)
WBC: 6.3 10*3/uL (ref 4.0–10.5)
nRBC: 0 % (ref 0.0–0.2)

## 2022-03-17 LAB — URINALYSIS, ROUTINE W REFLEX MICROSCOPIC
Bilirubin Urine: NEGATIVE
Glucose, UA: NEGATIVE mg/dL
Hgb urine dipstick: NEGATIVE
Ketones, ur: NEGATIVE mg/dL
Leukocytes,Ua: NEGATIVE
Nitrite: NEGATIVE
Protein, ur: NEGATIVE mg/dL
Specific Gravity, Urine: 1.01 (ref 1.005–1.030)
pH: 7.5 (ref 5.0–8.0)

## 2022-03-17 LAB — LACTIC ACID, PLASMA: Lactic Acid, Venous: 1.2 mmol/L (ref 0.5–1.9)

## 2022-03-17 MED ORDER — IOHEXOL 300 MG/ML  SOLN
100.0000 mL | Freq: Once | INTRAMUSCULAR | Status: AC | PRN
Start: 2022-03-17 — End: 2022-03-17
  Administered 2022-03-17: 100 mL via INTRAVENOUS

## 2022-03-17 NOTE — Discharge Instructions (Signed)
Call your primary care doctor or specialist as discussed in the next 2-3 days.   Return immediately back to the ER if:  Your symptoms worsen within the next 12-24 hours. You develop new symptoms such as new fevers, persistent vomiting, new pain, shortness of breath, or new weakness or numbness, or if you have any other concerns.  

## 2022-03-17 NOTE — ED Notes (Signed)
Patient verbalizes understanding of discharge instructions. Opportunity for questioning and answers were provided. Armband removed by staff, pt discharged from ED.  

## 2022-03-17 NOTE — ED Provider Triage Note (Signed)
Emergency Medicine Provider Triage Evaluation Note ? ?Mary Summers , a 88 y.o. female  was evaluated in triage.  Pt complains of a fall last night down 7 steps of stairs. She states that she slipped and fell backwards, hit the back of her head. Denies LOC. She was able to ambulate normally afterwards. She does complain of posterior head pain, posterior neck pain, right lateral rib pain, and right sided abdominal pain. No n/v, dizziness, numbness, weakness, shortness of breath, speech changes, gait abnormality ? ?Review of Systems  ?Positive:  ?Negative:  ? ?Physical Exam  ?There were no vitals taken for this visit. ?Gen:   Awake, no distress   ?Resp:  Normal effort  ?MSK:   Moves extremities without difficulty  ?Other:  Alert and oriented x 3. PERRLA. Moves all ext.  ?Right lateral rib pain, right sided abdominal pain, no signs of head trauma. Midline C spine tenderness ? ?Medical Decision Making  ?Medically screening exam initiated at 1:47 PM.  Appropriate orders placed.  Priscella Donna was informed that the remainder of the evaluation will be completed by another provider, this initial triage assessment does not replace that evaluation, and the importance of remaining in the ED until their evaluation is complete. ? ? ?  ?Adolphus Birchwood, PA-C ?03/17/22 1350 ? ?

## 2022-03-17 NOTE — ED Triage Notes (Signed)
Pt arrived POV from home c/o a fall last night. Pt states she lost her balance and fell down 7 steps. Pt states she did hit her head and is c/o of right sided flank pain. Pt is on eliquis.  ?

## 2022-03-17 NOTE — ED Provider Notes (Signed)
?[facility] ?Provider Note ? ? ?CSN: [id number] ?Arrival date & time: 03/17/22  1319 ? ?  ? ?History ? ?Chief Complaint  ?Patient presents with  ? Fall  ? ? ?Mary Summers is a 88 y.o. female. ? ?Patient presents to ER chief complaint of a fall.  She states that she was walking last night when she lost her balance and fell down approximately 7 steps.  Complaining of right-sided flank pain.  Denies loss of consciousness.  Denies other illnesses such as fevers cough vomiting or diarrhea.  She is on Eliquis at baseline. ? ? ?  ? ?Home Medications ?Prior to Admission medications   ?Medication Sig Start Date End Date Taking? Authorizing Provider  ?amoxicillin (AMOXIL) 500 MG capsule TAKE 4 CAPSULES BY MOUTH 1 HR BEFORE DENTAL APPT ?Patient taking differently: Take 2,000 mg by mouth See admin instructions. TAKE 4 CAPSULES BY MOUTH 1 HR BEFORE DENTAL APPT 11/09/21  Yes Burchette, Alinda Sierras, MD  ?apixaban (ELIQUIS) 5 MG TABS tablet TAKE 1 TABLET BY MOUTH TWICE A DAY ?Patient taking differently: Take 5 mg by mouth 2 (two) times daily. 06/25/21  Yes Hilty, Nadean Corwin, MD  ?Ascorbic Acid (VITAMIN C PO) Take 1 tablet by mouth daily.   Yes [provider]  ?atorvastatin (LIPITOR) 40 MG tablet Take 1 tablet (40 mg total) by mouth daily. 09/01/21  Yes Hilty, Nadean Corwin, MD  ?beta carotene 30 MG capsule Take 30 mg by mouth daily.   Yes [provider]  ?calcium gluconate 500 MG tablet Take 1 tablet by mouth daily.   Yes [provider]  ?Calcium-Magnesium-Zinc (CAL-MAG-ZINC PO) Take by mouth.   Yes [provider]  ?co-enzyme Q-10 30 MG capsule Take 30 mg by mouth daily.   Yes [provider]  ?glucosamine-chondroitin 500-400 MG tablet Take 1 tablet by mouth daily.    Yes [provider]  ?metoprolol succinate (TOPROL-XL) 50 MG 24 hr tablet TAKE 1 TABLET BY MOUTH EVERY DAY ?Patient taking differently: Take 50 mg by mouth daily. 06/25/21  Yes  Hilty, Nadean Corwin, MD  ?Multiple Vitamin (MULTIVITAMIN) tablet Take 1 tablet by mouth daily.   Yes [provider]  ?naproxen sodium (ALEVE) 220 MG tablet Take 220 mg by mouth 2 (two) times daily as needed (pain).   Yes [provider]  ?Omega-3 Fatty Acids (OMEGA 3 PO) Take 1 capsule by mouth daily.   Yes [provider]  ?Polyethyl Glycol-Propyl Glycol 0.4-0.3 % SOLN Place 1 drop into both eyes 3 (three) times daily as needed (dry/irritated eyes.).   Yes [provider]  ?SELENIUM PO Take 1 tablet by mouth daily.   Yes [provider]  ?Vitamin D, Cholecalciferol, 50 MCG (2000 UT) CAPS Take 2,000 Units by mouth daily.   Yes [provider]  ?vitamin E 400 UNIT capsule Take 400 Units by mouth daily.   Yes [provider]  ?benzonatate (TESSALON) 100 MG capsule Take 1 capsule (100 mg total) by mouth every 8 (eight) hours. 08/24/21   Ward, Lenise Arena, PA-C  ?ciclopirox (PENLAC) 8 % solution Apply topically at bedtime. Apply over nail and surrounding skin. Apply daily over previous coat. After seven (7) days, may remove with alcohol and continue cycle. ?Patient not taking: Reported on 03/17/2022 11/21/13   Marletta Lor, MD  ?fluticasone [facility]) 50 MCG/ACT nasal spray Place 1 spray into both nostrils daily as needed for allergies or rhinitis. ONCE A DAY IF NEEDED ?Patient  not taking: Reported on 03/17/2022 11/21/13   Marletta Lor, MD  ?   ? ?Allergies    ?Patient has no known allergies.   ? ?Review of Systems   ?Review of Systems  ?Constitutional:  Negative for fever.  ?HENT:  Negative for ear pain.   ?Eyes:  Negative for pain.  ?Respiratory:  Negative for cough.   ?Cardiovascular:  Negative for chest pain.  ?Gastrointestinal:  Negative for abdominal pain.  ?Genitourinary:  Positive for flank pain.  ?Musculoskeletal:  Negative for back pain.  ?Skin:  Negative for rash.  ?Neurological:  Negative for headaches.  ? ?Physical Exam ?Updated Vital  Signs ?BP (!) 167/85   Pulse 77   Temp 98.4 ?F (36.9 ?C)   Resp 19   Ht 5' 6.5" (1.689 m)   Wt 79.8 kg   SpO2 98%   BMI 27.98 kg/m?  ?Physical Exam ?Constitutional:   ?   General: She is not in acute distress. ?   Appearance: Normal appearance.  ?HENT:  ?   Head: Normocephalic.  ?   Nose: Nose normal.  ?Eyes:  ?   Extraocular Movements: Extraocular movements intact.  ?Cardiovascular:  ?   Rate and Rhythm: Normal rate.  ?Pulmonary:  ?   Effort: Pulmonary effort is normal.  ?Musculoskeletal:     ?   General: No tenderness. Normal range of motion.  ?   Cervical back: Normal range of motion.  ?   Comments: Tenderness and bruising noted to the right flank. ? ?Otherwise normal range of motion of bilateral shoulders elbows wrists and hips knees and ankles without pain or discomfort.  ?Neurological:  ?   General: No focal deficit present.  ?   Mental Status: She is alert and oriented to person, place, and time. Mental status is at baseline.  ?   Cranial Nerves: No cranial nerve deficit.  ?   Motor: No weakness.  ?   Gait: Gait normal.  ? ? ?ED Results / Procedures / Treatments   ?Labs ?(all labs ordered are listed, but only abnormal results are displayed) ?Labs Reviewed  ?COMPREHENSIVE METABOLIC PANEL - Abnormal; Notable for the following components:  ?    Result Value  ? Glucose, Bld 118 (*)   ? All other components within normal limits  ?CBC  ?URINALYSIS, ROUTINE W REFLEX MICROSCOPIC  ?LACTIC ACID, PLASMA  ?PROTIME-INR  ? ? ?EKG ?EKG Interpretation ? ?Date/Time:  Wednesday March 17 2022 13:34:25 EDT ?Ventricular Rate:  92 ?PR Interval:  200 ?QRS Duration: 86 ?QT Interval:  354 ?QTC Calculation: 437 ?R Axis:   1 ?Text Interpretation: Normal sinus rhythm Possible Anterior infarct , age undetermined Abnormal ECG When compared with ECG of 02-Mar-2022 18:29, PREVIOUS ECG IS PRESENT Confirmed by Thamas Jaegers ([id number]) on 03/17/2022 5:21:04 PM ? ?Radiology ?DG Chest 1 View ? ?Result Date: 03/17/2022 ?CLINICAL DATA:  Fall,  right chest pain and painful inspiration EXAM: CHEST  1 VIEW COMPARISON:  Chest radiographs, 04/08/2020, CT chest, 03/02/2022 FINDINGS: The heart size and mediastinal contours are within normal limits. Unchanged nodular opacities of the bilateral upper lobes, better characterized by recent prior CT. The visualized skeletal structures are unremarkable. IMPRESSION: 1. No acute abnormality of the lungs. Unchanged nodular opacities of the bilateral upper lobes, better characterized by recent prior CT. 2.  No obvious displaced rib fracture on single frontal view. Electronically Signed   By: Delanna Ahmadi M.D.   On: 03/17/2022 14:23  ? ?CT Head Wo Contrast ? ?Result Date:  03/17/2022 ?CLINICAL DATA:  Head trauma EXAM: CT HEAD WITHOUT CONTRAST TECHNIQUE: Contiguous axial images were obtained from the base of the skull through the vertex without intravenous contrast. RADIATION DOSE REDUCTION: This exam was performed according to the departmental dose-optimization program which includes automated exposure control, adjustment of the mA and/or kV according to patient size and/or use of iterative reconstruction technique. COMPARISON:  CT head 03/02/2022 FINDINGS: Brain: No acute intracranial hemorrhage, mass effect, or herniation. No extra-axial fluid collections. No evidence of acute territorial infarct. No hydrocephalus. Mild cortical volume loss. Mild patchy hypodensities in the periventricular and subcortical white matter, likely secondary to chronic microvascular ischemic changes. Vascular: No hyperdense vessel or unexpected calcification. Skull: Normal. Negative for fracture or focal lesion. Sinuses/Orbits: No acute finding. Other: None. IMPRESSION: Chronic changes with no acute intracranial process identified. Electronically Signed   By: Ofilia Neas M.D.   On: 03/17/2022 16:02  ? ?CT Cervical Spine Wo Contrast ? ?Result Date: 03/17/2022 ?CLINICAL DATA:  Trauma EXAM: CT CERVICAL SPINE WITHOUT CONTRAST TECHNIQUE:  Multidetector CT imaging of the cervical spine was performed without intravenous contrast. Multiplanar CT image reconstructions were also generated. RADIATION DOSE REDUCTION: This exam was performed according to the depart

## 2022-03-18 ENCOUNTER — Telehealth: Payer: Self-pay

## 2022-03-18 ENCOUNTER — Other Ambulatory Visit: Payer: Medicare Other

## 2022-03-18 NOTE — Telephone Encounter (Signed)
Transition Care Management Follow-up Telephone Call ?Date of discharge and from where: 03/17/22 from [facility] ED ?How have you been since you were released from the hospital? Pt states she's doing ok. States she sore under her left breast, but no broken bones. ?Any questions or concerns? No ? ?Items Reviewed: ?Did the pt receive and understand the discharge instructions provided? Yes  ?Medications obtained and verified?  N/a ?Any new allergies since your discharge? No  ?Dietary orders reviewed? No ?Do you have support at home? Yes  ? ?Home Care and Equipment/Supplies: ?Were home health services ordered? not applicable ? ? ?Functional Questionnaire: (I = Independent and D = Dependent) ?ADLs: I ? ?Bathing/Dressing- I ? ?Meal Prep- I ? ?Eating- I ? ?Maintaining continence- I ? ?Transferring/Ambulation- I ? ?Managing Meds- I ? ?Follow up appointments reviewed: ? ?PCP Hospital f/u appt confirmed? Yes  Scheduled to see Dr Elease Hashimoto on 03/23/22 @ 2:30p. ?[facility] f/u appt confirmed?  N/a   ?Are transportation arrangements needed? No  ?If their condition worsens, is the pt aware to call PCP or go to the Emergency Dept.? Yes ?Was the patient provided with contact information for the PCP's office or ED? Yes ?Was to pt encouraged to call back with questions or concerns? Yes  ?  ?

## 2022-03-18 NOTE — Telephone Encounter (Signed)
Transition Care Management Unsuccessful Follow-up Telephone Call ? ?Date of discharge and from where:  03/17/22 [facility] ED ? ?Attempts:  1st Attempt ? ?Reason for unsuccessful TCM follow-up call:  Left voice message ? ?  ?

## 2022-03-23 ENCOUNTER — Encounter: Payer: Self-pay | Admitting: Family Medicine

## 2022-03-23 ENCOUNTER — Other Ambulatory Visit: Payer: Self-pay | Admitting: Family Medicine

## 2022-03-23 ENCOUNTER — Ambulatory Visit
Admission: RE | Admit: 2022-03-23 | Discharge: 2022-03-23 | Disposition: A | Discharge status: Home / Self Care | Payer: Medicare Other | Source: Ambulatory Visit | Attending: Family Medicine | Admitting: Family Medicine

## 2022-03-23 ENCOUNTER — Ambulatory Visit: Payer: Medicare Other | Admitting: Family Medicine

## 2022-03-23 VITALS — BP 134/80 | HR 83 | Temp 97.5°F | Ht 66.5 in | Wt 179.0 lb

## 2022-03-23 DIAGNOSIS — S20211D Contusion of right front wall of thorax, subsequent encounter: Secondary | ICD-10-CM

## 2022-03-23 DIAGNOSIS — S5012XD Contusion of left forearm, subsequent encounter: Secondary | ICD-10-CM | POA: Diagnosis not present

## 2022-03-23 DIAGNOSIS — S40021D Contusion of right upper arm, subsequent encounter: Secondary | ICD-10-CM

## 2022-03-23 DIAGNOSIS — E041 Nontoxic single thyroid nodule: Secondary | ICD-10-CM

## 2022-03-23 DIAGNOSIS — R221 Localized swelling, mass and lump, neck: Secondary | ICD-10-CM

## 2022-03-23 NOTE — Progress Notes (Signed)
? ?Established Patient Office Visit ? ?Subjective:  ?Patient ID: Mary Summers, female    DOB: Sep 16, 1932  Age: 88 y.o. MRN: [id number] ? ?CC:  ?Chief Complaint  ?Patient presents with  ? Hospitalization Follow-up  ? ? ?HPI ?Mary Summers presents for ER follow-up after recent fall.  This occurred apparently on 28 March she was seen in the ED on the 29th the next day.  She states that she was coming down some stairs.  Her priest had been to their house to bless the house and she was carrying a Cross in both arms and she thinks she may have missed a step.  In any event, she fell down about 7 steps.  She did strike occiput of head but no loss of consciousness.  She recalls hitting her right flank.  She had some pain in the right flank and right lateral rib cage area.  She is on Eliquis.  She denies any confusion.  She has history of left hip replacement and severe arthritis right hip but no significant pain.  Generally has good balance. ? ?She had multiple x-rays which were all reviewed.  This included 1 view chest, CT head, CT cervical spine, and CT chest, abdomen, and pelvis.  No acute fracture.  No obvious rib fracture.  Incidental note of 1.6 cm thyroid nodule.  Ultrasound has been scheduled to further evaluate that and she actually had that earlier today. ? ?She has extensive bruising right lower abdominal region on the side and also right posterior arm and left forearm.  No major bony tenderness other than right lower lateral rib cage area.  Some pain with deep breathing. ? ?Past Medical History:  ?Diagnosis Date  ? Arthritis   ? Hyperlipidemia   ? Peripheral vascular disease ([place])   ? MILD CAROTID DISEASE BY DOPPLER STUDY  11/13/12. -REPORT FROM [facility]  ? Seasonal allergies   ? ? ?Past Surgical History:  ?Procedure Laterality Date  ? ATRIAL TACH ABLATION N/A 02/28/2019  ? Procedure: ATRIAL TACH ABLATION;  Surgeon: Evans Lance, MD;  Location: [facility];  Service: Cardiovascular;  Laterality: N/A;  ? BREAST SURGERY    ? BREAST BIOPSIES X 2  - NO CANCER  ? congenitally absent uterus    ? DOPPLER ECHOCARDIOGRAPHY    ? [place] NODULE  1985    ? TOTAL HIP ARTHROPLASTY  11/29/2012  ? Procedure: TOTAL HIP ARTHROPLASTY ANTERIOR APPROACH;  Surgeon: Gearlean Alf, MD;  Location: [facility];  Service: Orthopedics;  Laterality: Left;  ? ? ?Family History  ?Problem Relation Age of Onset  ? Arthritis Mother   ? Heart failure Father   ? Arthritis Sister   ? Heart disease Brother   ? Lung disease Neg Hx   ? Rheumatologic disease Neg Hx   ? ? ?Social History  ? ?Socioeconomic History  ? Marital status: Married  ?  Spouse name: Not on file  ? Number of children: 0  ? Years of education: Not on file  ? Highest education level: Not on file  ?Occupational History  ? Occupation: retired  ?Tobacco Use  ? Smoking status: Never  ? Smokeless tobacco: Never  ?Vaping Use  ? Vaping Use: Never used  ?Substance and Sexual Activity  ? Alcohol use: Yes  ?  Alcohol/week: 0.0 standard drinks  ?  Comment: OCCASIONAL ALCOHOL  ? Drug use: No  ? Sexual activity: Not on file  ?Other  Topics Concern  ? Not on file  ?Social History Narrative  ? Originally from [place], [place]. She lived for 14 years in [place]. She does live in the summer in [place]. Previously lived in [place]. Has previous travel to [place], [place], & [place]. 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 She reports he did work with some rare exotic woods from [place] & [place]. She does report chemical exposure to the spray booth in his shop. No bird exposure. She does have a hot tub but uses it irregularly.   ? ?Social Determinants of Health  ? ?Financial Resource Strain: Low Risk   ? Difficulty of Paying Living Expenses: Not hard at all  ?Food Insecurity: No Food Insecurity  ? Worried About Charity fundraiser in the Last Year: Never true  ? Ran Out of Food in the  Last Year: Never true  ?Transportation Needs: No Transportation Needs  ? Lack of Transportation (Medical): No  ? Lack of Transportation (Non-Medical): No  ?Physical Activity: Insufficiently Active  ? Days of Exercise per Week: 3 days  ? Minutes of Exercise per Session: 30 min  ?Stress: No Stress Concern Present  ? Feeling of Stress : Not at all  ?Social Connections: Moderately Integrated  ? Frequency of Communication with Friends and Family: More than three times a week  ? Frequency of Social Gatherings with Friends and Family: More than three times a week  ? Attends Religious Services: 1 to 4 times per year  ? Active Member of Clubs or Organizations: No  ? Attends Archivist Meetings: Never  ? Marital Status: Married  ?Intimate Partner Violence: Not At Risk  ? Fear of Current or Ex-Partner: No  ? Emotionally Abused: No  ? Physically Abused: No  ? Sexually Abused: No  ? ? ?Outpatient Medications Prior to Visit  ?Medication Sig Dispense Refill  ? amoxicillin (AMOXIL) 500 MG capsule TAKE 4 CAPSULES BY MOUTH 1 HR BEFORE DENTAL APPT (Patient taking differently: Take 2,000 mg by mouth See admin instructions. TAKE 4 CAPSULES BY MOUTH 1 HR BEFORE DENTAL APPT) 4 capsule 0  ? apixaban (ELIQUIS) 5 MG TABS tablet TAKE 1 TABLET BY MOUTH TWICE A DAY (Patient taking differently: Take 5 mg by mouth 2 (two) times daily.) 180 tablet 0  ? Ascorbic Acid (VITAMIN C PO) Take 1 tablet by mouth daily.    ? atorvastatin (LIPITOR) 40 MG tablet Take 1 tablet (40 mg total) by mouth daily. 90 tablet 3  ? benzonatate (TESSALON) 100 MG capsule Take 1 capsule (100 mg total) by mouth every 8 (eight) hours. 21 capsule 0  ? beta carotene 30 MG capsule Take 30 mg by mouth daily.    ? calcium gluconate 500 MG tablet Take 1 tablet by mouth daily.    ? Calcium-Magnesium-Zinc (CAL-MAG-ZINC PO) Take by mouth.    ? ciclopirox (PENLAC) 8 % solution Apply topically at bedtime. Apply over nail and surrounding skin. Apply daily over previous coat.  After seven (7) days, may remove with alcohol and continue cycle. 6.6 mL 0  ? co-enzyme Q-10 30 MG capsule Take 30 mg by mouth daily.    ? fluticasone (FLONASE) 50 MCG/ACT nasal spray Place 1 spray into both nostrils daily as needed for allergies or rhinitis. ONCE A DAY IF NEEDED 16 g 2  ? glucosamine-chondroitin 500-400 MG tablet Take 1 tablet by mouth daily.     ? metoprolol succinate (TOPROL-XL) 50  MG 24 hr tablet TAKE 1 TABLET BY MOUTH EVERY DAY (Patient taking differently: Take 50 mg by mouth daily.) 90 tablet 3  ? Multiple Vitamin (MULTIVITAMIN) tablet Take 1 tablet by mouth daily.    ? naproxen sodium (ALEVE) 220 MG tablet Take 220 mg by mouth 2 (two) times daily as needed (pain).    ? Omega-3 Fatty Acids (OMEGA 3 PO) Take 1 capsule by mouth daily.    ? Polyethyl Glycol-Propyl Glycol 0.4-0.3 % SOLN Place 1 drop into both eyes 3 (three) times daily as needed (dry/irritated eyes.).    ? SELENIUM PO Take 1 tablet by mouth daily.    ? Vitamin D, Cholecalciferol, 50 MCG (2000 UT) CAPS Take 2,000 Units by mouth daily.    ? vitamin E 400 UNIT capsule Take 400 Units by mouth daily.    ? ?No facility-administered medications prior to visit.  ? ? ?No Known Allergies ? ?ROS ?Review of Systems  ?Constitutional:  Negative for appetite change, chills, fever and unexpected weight change.  ?Respiratory:  Negative for shortness of breath.   ?Cardiovascular:  Negative for chest pain.  ?Genitourinary:  Negative for dysuria.  ?Neurological:  Negative for dizziness, syncope and headaches.  ?Psychiatric/Behavioral:  Negative for confusion.   ? ?  ?Objective:  ?  ?Physical Exam ?Vitals reviewed.  ?Constitutional:   ?   Appearance: Normal appearance.  ?HENT:  ?   Head: Normocephalic and atraumatic.  ?Cardiovascular:  ?   Rate and Rhythm: Normal rate.  ?Pulmonary:  ?   Effort: Pulmonary effort is normal.  ?   Breath sounds: Normal breath sounds.  ?Musculoskeletal:  ?   Cervical back: Neck supple.  ?   Comments: No cervical spinal  tenderness.  She has fair range of motion.  She has extensive bruising right posterior arm but no bony tenderness.  She has extensive bruising left forearm but no bony tenderness.  She has visible bruising

## 2022-03-23 NOTE — Progress Notes (Signed)
Future order placed for thyroid ultrasound in 1 year to follow-up 1.4 cm right thyroid nodule ? ?Eulas Post MD ?[facility] [place] ? ?

## 2022-03-24 ENCOUNTER — Other Ambulatory Visit: Payer: Self-pay

## 2022-03-24 DIAGNOSIS — E041 Nontoxic single thyroid nodule: Secondary | ICD-10-CM

## 2022-04-11 ENCOUNTER — Encounter: Payer: Self-pay | Admitting: Family Medicine

## 2022-04-12 ENCOUNTER — Encounter: Payer: Self-pay | Admitting: Pulmonary Disease

## 2022-04-12 ENCOUNTER — Ambulatory Visit: Payer: Medicare Other | Admitting: Pulmonary Disease

## 2022-04-12 VITALS — BP 136/72 | HR 85 | Temp 97.7°F | Ht 67.0 in | Wt 181.4 lb

## 2022-04-12 DIAGNOSIS — D869 Sarcoidosis, unspecified: Secondary | ICD-10-CM | POA: Diagnosis not present

## 2022-04-12 MED ORDER — AMOXICILLIN 500 MG PO CAPS
ORAL_CAPSULE | ORAL | 0 refills | Status: AC
Start: 2022-04-12 — End: ?

## 2022-04-12 NOTE — Patient Instructions (Signed)
I reviewed his CT from last month which shows stable findings. ?You have a diagnosis of sarcoid but it is inactive at present which is good news ?We will continue monitoring ?Follow-up in 1 year. ?
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
## 2022-04-13 ENCOUNTER — Encounter: Payer: Self-pay | Admitting: Pulmonary Disease

## 2022-04-13 DIAGNOSIS — D869 Sarcoidosis, unspecified: Secondary | ICD-10-CM | POA: Insufficient documentation

## 2022-04-15 ENCOUNTER — Ambulatory Visit (INDEPENDENT_AMBULATORY_CARE_PROVIDER_SITE_OTHER): Payer: Medicare Other

## 2022-04-15 VITALS — BP 122/62 | HR 97 | Temp 97.8°F | Ht 67.0 in | Wt 181.7 lb

## 2022-04-15 DIAGNOSIS — Z0000 Encounter for general adult medical examination without abnormal findings: Secondary | ICD-10-CM | POA: Diagnosis not present
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
## 2022-04-15 NOTE — Patient Instructions (Addendum)
?[name] , ?Thank you for taking time to come for your Medicare Wellness Visit. I appreciate your ongoing commitment to your health goals. Please review the following plan we discussed and let me know if I can assist you in the future.  ? ?These are the goals we discussed: ? Goals   ? ?   Exercise 3x per week (30 min per time)   ?   Lose more weight (pt-stated)   ? ?  ?  ?This is a list of the screening recommended for you and due dates:  ?Health Maintenance  ?Topic Date Due  ? COVID-19 Vaccine (3 - Booster for Moderna series) 05/01/2022*  ? Zoster (Shingles) Vaccine (2 of 2) 07/15/2022*  ? Tetanus Vaccine  04/16/2023*  ? Flu Shot  07/20/2022  ? Pneumonia Vaccine  Completed  ? DEXA scan (bone density measurement)  Completed  ? HPV Vaccine  Aged Out  ?*Topic was postponed. The date shown is not the original due date.  ? ?Advanced directives: Yes Copies on file ? ?Conditions/risks identified: None ? ?Next appointment: Follow up in one year for your annual wellness visit  ? ? ?Preventive Care 40 Years and Older, Female ?Preventive care refers to lifestyle choices and visits with your health care provider that can promote health and wellness. ?What does preventive care include? ?A yearly physical exam. This is also called an annual well check. ?Dental exams once or twice a year. ?Routine eye exams. Ask your health care provider how often you should have your eyes checked. ?Personal lifestyle choices, including: ?Daily care of your teeth and gums. ?Regular physical activity. ?Eating a healthy diet. ?Avoiding tobacco and drug use. ?Limiting alcohol use. ?Practicing safe sex. ?Taking low-dose aspirin every day. ?Taking vitamin and mineral supplements as recommended by your health care provider. ?What happens during an annual well check? ?The services and screenings done by your health care provider during your annual well check will depend on your age, overall health, lifestyle risk factors, and family history  of disease. ?Counseling  ?Your health care provider may ask you questions about your: ?Alcohol use. ?Tobacco use. ?Drug use. ?Emotional well-being. ?Home and relationship well-being. ?Sexual activity. ?Eating habits. ?History of falls. ?Memory and ability to understand (cognition). ?Work and work Statistician. ?Reproductive health. ?Screening  ?You may have the following tests or measurements: ?Height, weight, and BMI. ?Blood pressure. ?Lipid and cholesterol levels. These may be checked every 5 years, or more frequently if you are over 50 years old. ?Skin check. ?Lung cancer screening. You may have this screening every year starting at age 48 if you have a 30-pack-year history of smoking and currently smoke or have quit within the past 15 years. ?Fecal occult blood test (FOBT) of the stool. You may have this test every year starting at age 55. ?Flexible sigmoidoscopy or colonoscopy. You may have a sigmoidoscopy every 5 years or a colonoscopy every 10 years starting at age 16. ?Hepatitis C blood test. ?Hepatitis B blood test. ?Sexually transmitted disease (STD) testing. ?Diabetes screening. This is done by checking your blood sugar (glucose) after you have not eaten for a while (fasting). You may have this done every 1-3 years. ?Bone density scan. This is done to screen for osteoporosis. You may have this done starting at age 54. ?Mammogram. This may be done every 1-2 years. Talk to your health care provider about how often you should have regular mammograms. ?Talk with your health care provider about your test results, treatment options, and  if necessary, the need for more tests. ?Vaccines  ?Your health care provider may recommend certain vaccines, such as: ?Influenza vaccine. This is recommended every year. ?Tetanus, diphtheria, and acellular pertussis (Tdap, Td) vaccine. You may need a Td booster every 10 years. ?Zoster vaccine. You may need this after age 93. ?Pneumococcal 13-valent conjugate (PCV13) vaccine. One  dose is recommended after age 82. ?Pneumococcal polysaccharide (PPSV23) vaccine. One dose is recommended after age 1. ?Talk to your health care provider about which screenings and vaccines you need and how often you need them. ?This information is not intended to replace advice given to you by your health care provider. Make sure you discuss any questions you have with your health care provider. ?Document Released: 01/02/2016 Document Revised: 08/25/2016 Document Reviewed: 10/07/2015 ?Elsevier Interactive Patient Education ? 2017 [place]. ? ?Fall Prevention in the Home ?Falls can cause injuries. They can happen to people of all ages. There are many things you can do to make your home safe and to help prevent falls. ?What can I do on the outside of my home? ?Regularly fix the edges of walkways and driveways and fix any cracks. ?Remove anything that might make you trip as you walk through a door, such as a raised step or threshold. ?Trim any bushes or trees on the path to your home. ?Use bright outdoor lighting. ?Clear any walking paths of anything that might make someone trip, such as rocks or tools. ?Regularly check to see if handrails are loose or broken. Make sure that both sides of any steps have handrails. ?Any raised decks and porches should have guardrails on the edges. ?Have any leaves, snow, or ice cleared regularly. ?Use sand or salt on walking paths during winter. ?Clean up any spills in your garage right away. This includes oil or grease spills. ?What can I do in the bathroom? ?Use night lights. ?Install grab bars by the toilet and in the tub and shower. Do not use towel bars as grab bars. ?Use non-skid mats or decals in the tub or shower. ?If you need to sit down in the shower, use a plastic, non-slip stool. ?Keep the floor dry. Clean up any water that spills on the floor as soon as it happens. ?Remove soap buildup in the tub or shower regularly. ?Attach bath mats securely with double-sided  non-slip rug tape. ?Do not have throw rugs and other things on the floor that can make you trip. ?What can I do in the bedroom? ?Use night lights. ?Make sure that you have a light by your bed that is easy to reach. ?Do not use any sheets or blankets that are too big for your bed. They should not hang down onto the floor. ?Have a firm chair that has side arms. You can use this for support while you get dressed. ?Do not have throw rugs and other things on the floor that can make you trip. ?What can I do in the kitchen? ?Clean up any spills right away. ?Avoid walking on wet floors. ?Keep items that you use a lot in easy-to-reach places. ?If you need to reach something above you, use a strong step stool that has a grab bar. ?Keep electrical cords out of the way. ?Do not use floor polish or wax that makes floors slippery. If you must use wax, use non-skid floor wax. ?Do not have throw rugs and other things on the floor that can make you trip. ?What can I do with my stairs? ?Do not leave any  items on the stairs. ?Make sure that there are handrails on both sides of the stairs and use them. Fix handrails that are broken or loose. Make sure that handrails are as long as the stairways. ?Check any carpeting to make sure that it is firmly attached to the stairs. Fix any carpet that is loose or worn. ?Avoid having throw rugs at the top or bottom of the stairs. If you do have throw rugs, attach them to the floor with carpet tape. ?Make sure that you have a light switch at the top of the stairs and the bottom of the stairs. If you do not have them, ask someone to add them for you. ?What else can I do to help prevent falls? ?Wear shoes that: ?Do not have high heels. ?Have rubber bottoms. ?Are comfortable and fit you well. ?Are closed at the toe. Do not wear sandals. ?If you use a stepladder: ?Make sure that it is fully opened. Do not climb a closed stepladder. ?Make sure that both sides of the stepladder are locked into place. ?Ask  someone to hold it for you, if possible. ?Clearly mark and make sure that you can see: ?Any grab bars or handrails. ?First and last steps. ?Where the edge of each step is. ?Use tools that help you move aroun

## 2022-04-20 ENCOUNTER — Telehealth: Payer: Self-pay | Admitting: Internal Medicine

## 2022-04-20 ENCOUNTER — Other Ambulatory Visit: Payer: Self-pay | Admitting: Internal Medicine

## 2022-04-20 DIAGNOSIS — I4891 Unspecified atrial fibrillation: Secondary | ICD-10-CM

## 2022-04-20 NOTE — Telephone Encounter (Signed)
?*  STAT* If patient is at the pharmacy, call can be transferred to refill team. ? ? ?1. Which medications need to be refilled? (please list name of each medication and dose if known) metoprolol succinate (TOPROL-XL) 50 MG 24 hr tablet ? ?2. Which pharmacy/location (including street and city if local pharmacy) is medication to be sent to? [id number]- [place], [place] - [address] AT [address]? ?3. Do they need a 30 day or 90 day supply?  90 day ? ? ? ?

## 2022-04-20 NOTE — Telephone Encounter (Signed)
Medication refilled and sent to the desired pharmacy ?

## 2022-04-20 NOTE — Telephone Encounter (Signed)
Prescription refill request for Eliquis received. ?Indication:Afib  ?Last office visit:03/12/22 ([place])  ?Scr: 0.62 (03/17/22)  ?Age: 88 ?Weight: 82.4kg ? ?Appropriate dose and refill sent to requested pharmacy. ?

## 2022-07-22 ENCOUNTER — Other Ambulatory Visit: Payer: Self-pay

## 2022-07-22 MED ORDER — ATORVASTATIN CALCIUM 40 MG PO TABS: 40.0000 mg | ORAL_TABLET | Freq: Every day | ORAL | 3 refills | Status: DC

## 2022-09-28 ENCOUNTER — Ambulatory Visit: Payer: Medicare Other | Admitting: Family Medicine

## 2022-09-28 ENCOUNTER — Encounter: Payer: Self-pay | Admitting: Family Medicine

## 2022-09-28 VITALS — BP 138/82 | HR 72 | Temp 97.5°F | Ht 67.0 in | Wt 177.7 lb

## 2022-09-28 DIAGNOSIS — M7022 Olecranon bursitis, left elbow: Secondary | ICD-10-CM

## 2022-09-28 DIAGNOSIS — M8580 Other specified disorders of bone density and structure, unspecified site: Secondary | ICD-10-CM

## 2022-09-28 DIAGNOSIS — I483 Typical atrial flutter: Secondary | ICD-10-CM

## 2022-09-28 NOTE — Progress Notes (Signed)
Established Patient Office Visit  Subjective   Patient ID: Mary Summers, female    DOB: 04-29-1932  Age: 88 y.o. MRN: [id number]  Chief Complaint  Patient presents with   Joint Swelling    Patient complains of left elbow swelling, x2 months     HPI   Mary Summers is seen to discuss the following items  Recent left elbow swelling when she was in [place].  She denies any injury.  She noticed some acute swelling left olecranon bursa.  Went to local ER there and was told to basically keep pressure off the elbow.  No procedures were done.  Her edema has resolved some since then.  She has not noted any erythema or pain.  No history of gout.  She is trying to be more conscious to keep pressure off the elbow  She has history of osteopenia.  She was asking about potential follow-up DEXA.  Last DEXA scan was slightly over 2 years ago.  Her last DEXA scan showed osteopenia with T score -1.7.  She is very active.  No recent falls.  Her mammogram is up-to-date and she had mammogram last March.  She has history of atrial flutter.  She apparently was mixed up and only taking her Eliquis once daily.  She remains on metoprolol extended release 50 mg daily.  No recent dizziness.  No chest pains.  No syncope.  Past Medical History:  Diagnosis Date   Arthritis    Hyperlipidemia    Peripheral vascular disease ([place])    MILD CAROTID DISEASE BY DOPPLER STUDY  11/13/12. -REPORT FROM [facility]   Seasonal allergies    Past Surgical History:  Procedure Laterality Date   ATRIAL TACH ABLATION N/A 02/28/2019   Procedure: ATRIAL TACH ABLATION;  Surgeon: Evans Lance, MD;  Location: [facility];  Service: Cardiovascular;  Laterality: N/A;   BREAST SURGERY     BREAST BIOPSIES X 2  - NO CANCER   congenitally absent uterus     DOPPLER ECHOCARDIOGRAPHY     LASER VOCAL CORD FOR NODULE  1985     TOTAL HIP ARTHROPLASTY  11/29/2012   Procedure: TOTAL HIP ARTHROPLASTY  ANTERIOR APPROACH;  Surgeon: Gearlean Alf, MD;  Location: [facility];  Service: Orthopedics;  Laterality: Left;    reports that she has never smoked. She has never used smokeless tobacco. She reports current alcohol use. She reports that she does not use drugs. family history includes Arthritis in her mother and sister; Heart disease in her brother; Heart failure in her father. No Known Allergies  Review of Systems  Constitutional:  Negative for malaise/fatigue.  Eyes:  Negative for blurred vision.  Respiratory:  Negative for shortness of breath.   Cardiovascular:  Negative for chest pain.  Neurological:  Negative for dizziness, weakness and headaches.      Objective:     BP 138/82 (BP Location: Left Arm, Cuff Size: Normal)   Pulse 72   Temp (!) 97.5 F (36.4 C) (Oral)   Ht '5\' 7"'$  (1.702 m)   Wt 177 lb 11.2 oz (80.6 kg)   SpO2 97%   BMI 27.83 kg/m       No results found for any visits on 09/28/22.    The ASCVD Risk score (Arnett DK, et al., 2019) failed to calculate for the following reasons:   The 2019 ASCVD risk score is only valid for ages 69 to 39    Assessment & Plan:   #  1 recent left olecranon bursitis.  Resolved at this time.  No evidence for gout.  Reassurance.  Keep pressure off elbow is much as possible.  #2 history of osteopenia.  Patient would like to get follow-up DEXA scan.  Last DEXA was over 2 years ago.  This is ordered.  Continue regular weightbearing and daily calcium 1200 mg and vitamin D 1000 international units  #3 atrial flutter history.  Regular rhythm today.  Continue Eliquis but she should step this up to twice daily.  She is only been taking this once daily recently.  Continue Toprol and continue close follow-up with cardiology  Flu vaccine offered and she plans to get next week   No follow-ups on file.    Carolann Littler, MD

## 2023-01-17 ENCOUNTER — Ambulatory Visit (INDEPENDENT_AMBULATORY_CARE_PROVIDER_SITE_OTHER): Payer: Medicare Other | Admitting: Family Medicine

## 2023-01-17 ENCOUNTER — Encounter: Payer: Self-pay | Admitting: Family Medicine

## 2023-01-17 VITALS — BP 132/60 | HR 91 | Temp 97.5°F | Ht 64.57 in | Wt 174.1 lb

## 2023-01-17 DIAGNOSIS — Z79899 Other long term (current) drug therapy: Secondary | ICD-10-CM

## 2023-01-17 DIAGNOSIS — Z0000 Encounter for general adult medical examination without abnormal findings: Secondary | ICD-10-CM

## 2023-01-17 DIAGNOSIS — I483 Typical atrial flutter: Secondary | ICD-10-CM

## 2023-01-17 DIAGNOSIS — E7800 Pure hypercholesterolemia, unspecified: Secondary | ICD-10-CM

## 2023-01-17 LAB — CBC WITH DIFFERENTIAL/PLATELET
Basophils Absolute: 0.1 10*3/uL (ref 0.0–0.1)
Basophils Relative: 0.9 % (ref 0.0–3.0)
Eosinophils Absolute: 0.1 10*3/uL (ref 0.0–0.7)
Eosinophils Relative: 2 % (ref 0.0–5.0)
HCT: 42.6 % (ref 36.0–46.0)
Hemoglobin: 14.4 g/dL (ref 12.0–15.0)
Lymphocytes Relative: 23.4 % (ref 12.0–46.0)
Lymphs Abs: 1.6 10*3/uL (ref 0.7–4.0)
MCHC: 33.7 g/dL (ref 30.0–36.0)
MCV: 92.2 fl (ref 78.0–100.0)
Monocytes Absolute: 0.6 10*3/uL (ref 0.1–1.0)
Monocytes Relative: 9.4 % (ref 3.0–12.0)
Neutro Abs: 4.3 10*3/uL (ref 1.4–7.7)
Neutrophils Relative %: 64.3 % (ref 43.0–77.0)
Platelets: 170 10*3/uL (ref 150.0–400.0)
RBC: 4.63 Mil/uL (ref 3.87–5.11)
RDW: 13 % (ref 11.5–15.5)
WBC: 6.7 10*3/uL (ref 4.0–10.5)

## 2023-01-17 LAB — COMPREHENSIVE METABOLIC PANEL
ALT: 16 U/L (ref 0–35)
AST: 20 U/L (ref 0–37)
Albumin: 4.2 g/dL (ref 3.5–5.2)
Alkaline Phosphatase: 81 U/L (ref 39–117)
BUN: 14 mg/dL (ref 6–23)
CO2: 29 mEq/L (ref 19–32)
Calcium: 9.2 mg/dL (ref 8.4–10.5)
Chloride: 103 mEq/L (ref 96–112)
Creatinine, Ser: 0.62 mg/dL (ref 0.40–1.20)
GFR: 79.4 mL/min (ref >60.00)
Glucose, Bld: 89 mg/dL (ref 70–99)
Potassium: 4.3 mEq/L (ref 3.5–5.1)
Sodium: 139 mEq/L (ref 135–145)
Total Bilirubin: 0.7 mg/dL (ref 0.2–1.2)
Total Protein: 7.1 g/dL (ref 6.0–8.3)

## 2023-01-17 LAB — LIPID PANEL
Cholesterol: 150 mg/dL (ref 0–200)
HDL: 66.3 mg/dL (ref >39.00)
LDL Cholesterol: 63 mg/dL (ref 0–99)
NonHDL: 84.16
Total CHOL/HDL Ratio: 2
Triglycerides: 107 mg/dL (ref 0.0–149.0)
VLDL: 21.4 mg/dL (ref 0.0–40.0)

## 2023-01-17 NOTE — Patient Instructions (Signed)
Myrbetriq for overactive bladder symptoms

## 2023-01-17 NOTE — Progress Notes (Signed)
Established Patient Office Visit  Subjective   Patient ID: Mary Summers, female    DOB: 02-26-1932  Age: 89 y.o. MRN: [id number]  Chief Complaint  Patient presents with   Annual Exam    HPI   Mrs Dorethea Clan is seen for physical exam.  She and her husband both stay very active.  They are getting ready to go to [place] for 3 weeks to a time share they have their and that will be followed by their yearly trip to [place] where they spend each summer.  They have another home in [place].  She has history of atrial flutter, PSVT, osteoarthritis, hyperlipidemia, interstitial lung disease.  Generally doing well with day-to-day activities.  She remains on atorvastatin, Eliquis, and Toprol-XL.  However, she states she is only taking Eliquis once daily.  She had concerns over potential for side effects with twice daily.  She had some recent issues with lumbar back pain and went to [facility].  She had an injection with some improvement.  Ambulating without difficulty.  Does have some issues with overactive bladder.  No recent burning with urination.  Frequently gets up 2-3 times during the night.  Maintenance reviewed:  -Flu vaccine already given -Pneumonia vaccine complete -She has already received Shingrix -She has been getting mammograms about every other year. -Aged out of colonoscopy screening  Family history-reviewed with no change.  Her father had heart failure.  Mother osteoarthritis.  Brother had heart disease   Social history-married.  Non-smoker.  No regular alcohol.  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  She and her husband have a second home in [place] and spent a good part of the year there  Past Medical History:  Diagnosis Date   Arthritis    Hyperlipidemia    Peripheral vascular disease ([place])    MILD CAROTID DISEASE BY DOPPLER STUDY  11/13/12. -REPORT FROM [facility]   Seasonal allergies    Past Surgical History:   Procedure Laterality Date   ATRIAL TACH ABLATION N/A 02/28/2019   Procedure: ATRIAL TACH ABLATION;  Surgeon: Evans Lance, MD;  Location: [facility];  Service: Cardiovascular;  Laterality: N/A;   BREAST SURGERY     BREAST BIOPSIES X 2  - NO CANCER   congenitally absent uterus     DOPPLER ECHOCARDIOGRAPHY     LASER VOCAL CORD FOR NODULE  1985     TOTAL HIP ARTHROPLASTY  11/29/2012   Procedure: TOTAL HIP ARTHROPLASTY ANTERIOR APPROACH;  Surgeon: Gearlean Alf, MD;  Location: [facility];  Service: Orthopedics;  Laterality: Left;    reports that she has never smoked. She has never used smokeless tobacco. She reports current alcohol use. She reports that she does not use drugs. family history includes Arthritis in her mother and sister; Heart disease in her brother; Heart failure in her father. No Known Allergies   Review of Systems  Constitutional:  Negative for chills, fever and malaise/fatigue.  Eyes:  Negative for blurred vision.  Respiratory:  Negative for shortness of breath.   Cardiovascular:  Negative for chest pain.  Gastrointestinal:  Negative for abdominal pain.  Genitourinary:  Positive for frequency and urgency. Negative for dysuria, flank pain and hematuria.  Musculoskeletal:  Positive for back pain.  Neurological:  Negative for dizziness, weakness and headaches.      Objective:     BP 132/60 (BP Location: Left Arm, Patient Position: Sitting, Cuff Size: Normal)   Pulse 91   Temp Marland Kitchen)  97.5 F (36.4 C) (Oral)   Ht 5' 4.57" (1.64 m)   Wt 174 lb 1.6 oz (79 kg)   SpO2 98%   BMI 29.36 kg/m  BP Readings from Last 3 Encounters:  01/17/23 132/60  09/28/22 138/82  04/15/22 122/62   Wt Readings from Last 3 Encounters:  01/17/23 174 lb 1.6 oz (79 kg)  09/28/22 177 lb 11.2 oz (80.6 kg)  04/15/22 181 lb 11.2 oz (82.4 kg)      Physical Exam Vitals reviewed.  Constitutional:      Appearance: She is well-developed.  Eyes:     Pupils: Pupils are equal, round,  and reactive to light.  Neck:     Thyroid: No thyromegaly.     Vascular: No JVD.  Cardiovascular:     Rate and Rhythm: Normal rate and regular rhythm.     Heart sounds:     No gallop.  Pulmonary:     Effort: Pulmonary effort is normal. No respiratory distress.     Breath sounds: Normal breath sounds. No wheezing or rales.  Musculoskeletal:     Cervical back: Neck supple.     Right lower leg: No edema.     Left lower leg: No edema.  Neurological:     General: No focal deficit present.     Mental Status: She is alert.      No results found for any visits on 01/17/23.    The ASCVD Risk score (Arnett DK, et al., 2019) failed to calculate for the following reasons:   The 2019 ASCVD risk score is only valid for ages 71 to 23    Assessment & Plan:   Physical exam.  She has chronic problems as above.  She does have history of atrial flutter and has not been taking her Eliquis consistently twice daily.  We advised her to step up her Eliquis to twice daily as prescribed.  -Obtain follow-up labs including CBC, CMP, and lipid panel -Continue annual flu vaccine -She will consider at least every other year mammogram -Aged out of colonoscopy  -We did discuss possible therapies for urine urgency including Myrbetriq.  Will try to avoid medications with anticholinergic side effects if possible.  Avoid late day use of caffeine   No follow-ups on file.    Carolann Littler, MD

## 2023-03-04 ENCOUNTER — Other Ambulatory Visit: Payer: Self-pay | Admitting: Family Medicine

## 2023-03-04 DIAGNOSIS — Z1231 Encounter for screening mammogram for malignant neoplasm of breast: Secondary | ICD-10-CM

## 2023-03-08 ENCOUNTER — Encounter: Payer: Self-pay | Admitting: Family Medicine

## 2023-03-23 ENCOUNTER — Ambulatory Visit
Admission: RE | Admit: 2023-03-23 | Discharge: 2023-03-23 | Disposition: A | Discharge status: Home / Self Care | Payer: Medicare Other | Source: Ambulatory Visit | Attending: Family Medicine | Admitting: Family Medicine

## 2023-03-23 ENCOUNTER — Other Ambulatory Visit: Payer: Self-pay | Admitting: Internal Medicine

## 2023-03-23 DIAGNOSIS — Z1231 Encounter for screening mammogram for malignant neoplasm of breast: Secondary | ICD-10-CM

## 2023-03-23 DIAGNOSIS — I4891 Unspecified atrial fibrillation: Secondary | ICD-10-CM

## 2023-03-23 NOTE — Telephone Encounter (Signed)
Prescription refill request for Eliquis received. Indication: a fib/flutter Last office visit: 03/12/22. Has appt scheduled 04/20/23 Scr: 0.62 Age: 89 Weight: 79kg

## 2023-03-28 NOTE — Progress Notes (Signed)
ACUTE VISIT Chief Complaint  Patient presents with   Cerumen Impaction   HPI: [name] is a 89 y.o. female with PMHx significant for PSVT, PAD, OA, HLD, sarcoidosis,and atrial fib on chronic anticoagulation here today complaining of decrease in hearing, which she has noticed for about a week and a half.  Right worse than left.  Ear Fullness  There is pain in both ears. This is a new problem. The current episode started 1 to 4 weeks ago. The problem has been unchanged. There has been no fever. The patient is experiencing no pain. Associated symptoms include hearing loss. Pertinent negatives include no abdominal pain, coughing, ear discharge, headaches, neck pain, rash, rhinorrhea, sore throat or vomiting. She has tried nothing for the symptoms. Her past medical history is significant for hearing loss.   She attributes this change to what she believes is wax accumulation in her ears.   She has not attempted any over-the-counter remedies to address the issue.  Additionally, she mentions having had the flu approximately three weeks ago, symptoms have resolved. No recent travel but planning on doing so in a few weeks.  Review of Systems  HENT:  Positive for hearing loss. Negative for ear discharge, rhinorrhea and sore throat.   Respiratory:  Negative for cough.   Gastrointestinal:  Negative for abdominal pain and vomiting.  Musculoskeletal:  Negative for neck pain.  Skin:  Negative for rash.  Neurological:  Negative for headaches.  See other pertinent positives and negatives in HPI.  Current Outpatient Medications on File Prior to Visit  Medication Sig Dispense Refill   amoxicillin (AMOXIL) 500 MG capsule Take 4 tablets 1 hour prior to dental procedure 8 capsule 0   apixaban (ELIQUIS) 5 MG TABS tablet TAKE 1 TABLET BY MOUTH TWICE A DAY 60 tablet 0   Ascorbic Acid (VITAMIN C PO) Take 1 tablet by mouth daily.     atorvastatin (LIPITOR) 40 MG tablet Take 1 tablet (40 mg  total) by mouth daily. 90 tablet 3   beta carotene 30 MG capsule Take 30 mg by mouth daily.     calcium gluconate 500 MG tablet Take 1 tablet by mouth daily.     Calcium-Magnesium-Zinc (CAL-MAG-ZINC PO) Take by mouth.     co-enzyme Q-10 30 MG capsule Take 30 mg by mouth daily.     glucosamine-chondroitin 500-400 MG tablet Take 1 tablet by mouth daily.      metoprolol succinate (TOPROL-XL) 50 MG 24 hr tablet TAKE 1 TABLET BY MOUTH EVERY DAY 90 tablet 3   Multiple Vitamin (MULTIVITAMIN) tablet Take 1 tablet by mouth daily.     Omega-3 Fatty Acids (OMEGA 3 PO) Take 1 capsule by mouth daily.     SELENIUM PO Take 1 tablet by mouth daily.     Vitamin D, Cholecalciferol, 50 MCG (2000 UT) CAPS Take 2,000 Units by mouth daily.     vitamin E 400 UNIT capsule Take 400 Units by mouth daily.     No current facility-administered medications on file prior to visit.    Past Medical History:  Diagnosis Date   Arthritis    Hyperlipidemia    Peripheral vascular disease    MILD CAROTID DISEASE BY DOPPLER STUDY  11/13/12. -REPORT FROM [facility]   Seasonal allergies    No Known Allergies  Social History   Socioeconomic History   Marital status: Married    Spouse name: Not on file   Number of children: 0  Years of education: Not on file   Highest education level: Master's degree (e.g., MA, MS, MEng, MEd, MSW, MBA)  Occupational History   Occupation: retired  Tobacco Use   Smoking status: Never   Smokeless tobacco: Never  Vaping Use   Vaping Use: Never used  Substance and Sexual Activity   Alcohol use: Yes    Alcohol/week: 0.0 standard drinks of alcohol    Comment: OCCASIONAL ALCOHOL   Drug use: No   Sexual activity: Not on file  Other Topics Concern   Not on file  Social History Narrative   Originally from [place], [place]. She lived for 14 years in [place]. She does live in the summer in [place]. Previously lived in [place]. Has previous travel to [place], [place], &  [place]. 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 She reports he did work with some rare exotic woods from [place]. She does report chemical exposure to the spray booth in his shop. No bird exposure. She does have a hot tub but uses it irregularly.    Social Determinants of Health   Financial Resource Strain: Low Risk  (09/26/2022)   Overall Financial Resource Strain (CARDIA)    Difficulty of Paying Living Expenses: Not hard at all  Food Insecurity: No Food Insecurity (09/26/2022)   Hunger Vital Sign    Worried About Running Out of Food in the Last Year: Never true    Ran Out of Food in the Last Year: Never true  Transportation Needs: No Transportation Needs (09/26/2022)   PRAPARE - Administrator, Civil Service (Medical): No    Lack of Transportation (Non-Medical): No  Physical Activity: Insufficiently Active (09/26/2022)   Exercise Vital Sign    Days of Exercise per Week: 3 days    Minutes of Exercise per Session: 10 min  Stress: No Stress Concern Present (09/26/2022)   [employer] of Occupational Health - Occupational Stress Questionnaire    Feeling of Stress : Only a little  Social Connections: Socially Integrated (09/26/2022)   Social Connection and Isolation Panel [NHANES]    Frequency of Communication with Friends and Family: Three times a week    Frequency of Social Gatherings with Friends and Family: More than three times a week    Attends Religious Services: More than 4 times per year    Active Member of Clubs or Organizations: Yes    Attends Banker Meetings: More than 4 times per year    Marital Status: Married   Vitals:   03/29/23 1109  BP: 120/60  Pulse: 88  Resp: 16  SpO2: 98%   Body mass index is 29.17 kg/m.  Physical Exam Vitals and nursing note reviewed.  Constitutional:      General: She is not in acute distress.    Appearance: She is not  ill-appearing.  HENT:     Head: Normocephalic and atraumatic.     Right Ear: External ear normal.     Left Ear: External ear normal.     Ears:     Comments: Cerumen excess left, not completely impacted. Right ear canal with cerumen impaction.    Mouth/Throat:     Mouth: Mucous membranes are moist.     Pharynx: Oropharynx is clear.  Eyes:     Conjunctiva/sclera: Conjunctivae normal.  Lymphadenopathy:     Cervical: No cervical adenopathy.  Neurological:     General: No focal deficit present.  Mental Status: She is alert.     Comments: Stable gait, not assisted.  Psychiatric:        Mood and Affect: Mood and affect normal.   ASSESSMENT AND PLAN:  Mary Summers seen today for worsening hearing loss and cerumen impaction.  Hearing loss of rigEzequiel Kayserht ear due to cerumen impaction After describing procedure and risks she wishes to proceed with ear lavage.  Ear Cerumen Removal  Date/Time: 03/29/2023 7:49 PM  Performed by: [place], Machele Deihl G, MD Authorized by: [place], Bevely Hackbart G, MD   Anesthesia: Local Anesthetic: none Location details: right ear Patient tolerance: patient tolerated the procedure well with no immediate complications Comments: Initial ear irrigation, which help to remove some of cerumen. Procedure completed with manuel removal using a small curette, all cerumen extracted. Procedure type: curette  Sedation: Patient sedated: no    Excessive cerumen in left ear canal No quite impacted, due to the risk of water trapping behind cerumen, advise her to use over-the-counter cerumenolytic drops for two weeks to soften the cerumen before attempting ear lavage. Continue avoiding Qtips.  Hearing Screening   500Hz  1000Hz  2000Hz  4000Hz   Right ear Fail Pass Pass Pass  Left ear Fail Pass Pass Pass    Return if symptoms worsen or fail to improve.  Ellena Kamen G. [place], MD  [facility]. [place] office.

## 2023-03-29 ENCOUNTER — Encounter: Payer: Self-pay | Admitting: Family Medicine

## 2023-03-29 ENCOUNTER — Ambulatory Visit: Payer: Medicare Other | Admitting: Family Medicine

## 2023-03-29 VITALS — BP 120/60 | HR 88 | Resp 16 | Ht 64.57 in | Wt 173.0 lb

## 2023-03-29 DIAGNOSIS — H6122 Impacted cerumen, left ear: Secondary | ICD-10-CM

## 2023-03-29 DIAGNOSIS — H6121 Impacted cerumen, right ear: Secondary | ICD-10-CM

## 2023-03-29 MED ORDER — DEBROX 6.5 % OT SOLN: 5.0000 [drp] | Freq: Two times a day (BID) | OTIC | 0 refills | Status: DC

## 2023-03-29 NOTE — Patient Instructions (Addendum)
A few things to remember from today's visit:  Hearing loss of right ear due to cerumen impaction  Excessive cerumen in left ear canal  Try eat drops in left ear for a week or so before your next visit.  Please be sure medication list is accurate. If a new problem present, please set up appointment sooner than planned today.

## 2023-04-01 ENCOUNTER — Encounter: Payer: Self-pay | Admitting: Pulmonary Disease

## 2023-04-01 ENCOUNTER — Ambulatory Visit: Payer: Medicare Other | Admitting: Pulmonary Disease

## 2023-04-01 VITALS — BP 120/74 | HR 85 | Ht 66.0 in | Wt 171.4 lb

## 2023-04-01 DIAGNOSIS — D869 Sarcoidosis, unspecified: Secondary | ICD-10-CM

## 2023-04-01 NOTE — Addendum Note (Signed)
Addended by: Maurene Capes on: 04/01/2023 02:43 PM   Modules accepted: Orders

## 2023-04-01 NOTE — Patient Instructions (Signed)
I am glad you are doing well with the breathing.  Will get a sputum cup to you to check sputum cultures Will get a high-res CT and PFTs in 1 year Return to clinic in 1 year after these tests.

## 2023-04-01 NOTE — Progress Notes (Signed)
Mary Summers    [id number]    04-08-1932  Primary Care Physician:Burchette, Elberta Fortis, MD  Referring Physician: Kristian Covey, MD [address] [place],  [place] [address]  Chief complaint: Follow-up for interstitial lung disease, sarcoidosis  HPI: Previously followed by Dr. Jamison Neighbor at [facility]. Seen by Pulmonary in 2017 with CT scan showing mediastinal adenopathy, calcification, lung nodule.  Initial work-up showing elevated ACE level, rheumatoid factor, SSA.  She is evaluated by Dr. Dierdre Forth, Rheumatology with no clinical evidence of connective tissue disease or Sjogren's  She is had an evaluation at [facility], [place] May 2019 with repeat high-resolution CT showing upper lobe nodules, calcified lymph nodes, PFTs with no obvious deficit, excellent exercise test.  Swallow eval showed mild to moderate aspiration with recommendations to minimize swallow dysfunction.  CT scanning of the sinuses showed mild sinusitis, rhinitis.  Echocardiogram showed normal LV function, elevated RVSP. Assessment from [place] was that ILD is consistent with sarcoidosis.  No need for treatment at present Follow lung function, chest imaging.  She will need work-up for pulmonary hypertension.  No symptoms today.  She has minimal dyspnea on exertion, minimal cough, nonproductive in nature.  Occasional joint pain, swelling, difficulty swallowing food, dry mouth, dry eyes.  Notes history of Zenker's diverticulum. She continues to maintain an active lifestyle.  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  She spends about half the year in [place], [place]  Pets: No pets, dogs, birds Occupation: Retired Chiropodist Smoking history: Never smoker  Travel history: Travel to [place]. Relevant family history: Significant family history of lung disease.  Comprehensive ILD questionnaire  09/22/2018-negative for environmental exposures, occupational  exposures, medications.  She used to own a feather pillow and comforter several years ago but she got rid of it.  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  Does have some cough with yellow mucus at times  Outpatient Encounter Medications as of 04/01/2023  Medication Sig   amoxicillin (AMOXIL) 500 MG capsule Take 4 tablets 1 hour prior to dental procedure   apixaban (ELIQUIS) 5 MG TABS tablet TAKE 1 TABLET BY MOUTH TWICE A DAY   Ascorbic Acid (VITAMIN C PO) Take 1 tablet by mouth daily.   atorvastatin (LIPITOR) 40 MG tablet Take 1 tablet (40 mg total) by mouth daily.   beta carotene 30 MG capsule Take 30 mg by mouth daily.   BORON PO Take by mouth.   calcium gluconate 500 MG tablet Take 1 tablet by mouth daily.   Calcium-Magnesium-Zinc (CAL-MAG-ZINC PO) Take by mouth.   co-enzyme Q-10 30 MG capsule Take 30 mg by mouth daily.   glucosamine-chondroitin 500-400 MG tablet Take 1 tablet by mouth daily.    metoprolol succinate (TOPROL-XL) 50 MG 24 hr tablet TAKE 1 TABLET BY MOUTH EVERY DAY   Multiple Vitamin (MULTIVITAMIN) tablet Take 1 tablet by mouth daily.   Omega-3 Fatty Acids (OMEGA 3 PO) Take 1 capsule by mouth daily.   SELENIUM PO Take 1 tablet by mouth daily.   Vitamin D, Cholecalciferol, 50 MCG (2000 UT) CAPS Take 2,000 Units by mouth daily.   vitamin E 400 UNIT capsule Take 400 Units by mouth daily.   [DISCONTINUED] carbamide peroxide (DEBROX) 6.5 % OTIC solution Place 5 drops into the left ear 2 (two) times daily.   No facility-administered encounter medications  on file as of 04/01/2023.   Physical Exam: Blood pressure 120/74, pulse 85, height 5\' 6"  (1.676 m), weight 171 lb 6.4 oz (77.7 kg), SpO2 97 %. Gen:      No acute distress HEENT:  EOMI, sclera anicteric Neck:     No masses; no thyromegaly Lungs:    Clear to auscultation bilaterally; normal respiratory effort CV:          Regular rate and rhythm; no murmurs Abd:      + bowel sounds; soft, non-tender; no palpable masses, no distension Ext:    No edema; adequate peripheral perfusion Skin:      Warm and dry; no rash Neuro: alert and oriented x 3 Psych: normal mood and affect   Data Reviewed: Imaging: High-resolution CT 03/02/2016- Hilar lymphadenopathy, mosaic attenuation with air trapping, interlobular septal thickening, upper lobe pulmonary nodules.  High-resolution CT 10/31/2018- stable hilar lymphadenopathy, upper airway septal thickening, lung nodules.  CT chest 03/17/2022-chronic changes in the chest with calcified mediastinal lymph nodes and chronic area of nodularity and architectural distortion in the upper lungs. I have reviewed the images personally.   PFTs: 11/02/16: FVC 3.07 L (106%) FEV1 2.51 L (116%) FEV1/FVC 0.82 FEF 25-75 2.74 L (188%), DLCO corrected 69% (Hgb 15.3) 06/25/16: FVC 3.23 L (111%) FEV1 2.16 L (121%) FEV1/FVC 0.81 FEF 25-75 2.80 L (189%), DLCO corrected 62% (hemoglobin 16.2) 04/22/16: FVC 3.10 L (107%) FEV1 2.56 L (118%) FEV1/FVC 0.83 FEF 25-75 2.90 L (194%), DLCO uncorrected 62% 01/05/16: FVC 3.14 L (107%) FEV1 2.60 L (119%) FEV1/FVC 0.83 FEF 25-75 2.95 L (196%) no bronchodilator response TLC 5.87 L (106%) RV 102% ERV 81% DLCO uncorrected 65%  11/02/16:  Walked 432 meters / Baseline Sat 98% on RA / Nadir Sat 98% on RA @ rest 06/25/16:  Walked 357 meters / Baseline Sat 96% on RA / Nadir Sat 96% on RA 04/22/16:  Walked 357 meters / Baseline Sat 96% on RA / Nadir Sat 95% on RA  Labs: 04/22/16 ACE:  72   02/25/16 ANA:  Negative Anti-CCP:  <16 RF:  72 SSA:  6.9 SSB:  <1  Cardiac Echocardiogram 10/31/2018-Normal LV systolic function with grade 2 diastolic dyfunction.   Mild-moderated increased PA systolic pressure. Mild TR.  Data from [facility] May 2019 CBC-WBC 6.9, eos 2.5%, absolute eosinophil count 200, metabolic panel, LFTs within normal  limits. Barium swallow 05/03/2018- mild to moderate oropharyngeal dysphagia which indicates increased risk for aspiration.  CT chest high-resolution 05/02/2018-partially calcified bilateral hilar and mediastinal adenopathy and upper lung perilymphatic nodularity compatible with sarcoidosis, small hiatal hernia. CT sinus 05/02/2018- minimal ethmoid sinusitis, question right mastoiditis.  Findings suggest rhinitis.  Echocardiogram 05/02/2018-suboptimal image quality, decreased LV cavity size, LVEF 55-60%, RV is moderately enlarged.  Normal RV systolic function and diastolic function.  RVSP is moderately elevated at 20mm, IVC is increased in size suggestive of mild increased right atrial pressure.  Sleep: Sleep study 12/26/2018 AHI 4.3 with low O2 sat of 87%.  Assessment:  Sarcoidosis CT scan which I personally reviewed shows a pattern of calcified lymphadenopathy, perilymphatic nodularity is consistent with sarcoid.  There is a question of connective tissue disease with positive SSA which is likely nonspecific  No need for treatment at present as she is asymptomatic. Last CT scan shows stable findings with no evidence of active sarcoidosis.  LFTs from January 2024 are normal. She has regular visits with ophthalmology with no evidence of ocular sarcoid  Check sputum cultures as she has  occasional cough with yellow mucus.  Aspiration Swallow study noted with mild to moderate aspiration risk.  Follow aspiration precautions.  Pulmonary hypertension Repeat echo shows moderate pulmonary hypertension, may be secondary to sarcoid Clinically she is doing well and we will continue to monitor  Plan/Recommendations: - Follow-up in 1 year with CT and PFTs - Sputum cultures  Chilton Greathouse MD [facility] 04/01/2023, 1:44 PM  CC: Kristian Covey, MD

## 2023-04-04 ENCOUNTER — Ambulatory Visit: Payer: Medicare Other

## 2023-04-18 ENCOUNTER — Ambulatory Visit (INDEPENDENT_AMBULATORY_CARE_PROVIDER_SITE_OTHER): Payer: Medicare Other

## 2023-04-18 VITALS — Ht 66.0 in | Wt 168.0 lb

## 2023-04-18 DIAGNOSIS — Z0000 Encounter for general adult medical examination without abnormal findings: Secondary | ICD-10-CM | POA: Diagnosis not present

## 2023-04-18 NOTE — Progress Notes (Signed)
Subjective:   Mary Summers is a 89 y.o. female who presents for Medicare Annual (Subsequent) preventive examination.  Review of Systems    Virtual Visit via Telephone Note  I connected with  Mary Summers on 04/18/23 at 11:00 AM EDT by telephone and verified that I am speaking with the correct person using two identifiers.  Location: Patient: Home Provider: Office Persons participating in the virtual visit: patient/Nurse Health Advisor   I discussed the limitations, risks, security and privacy concerns of performing an evaluation and management service by telephone and the availability of in person appointments. The patient expressed understanding and agreed to proceed.  Interactive audio and video telecommunications were attempted between this nurse and patient, however failed, due to patient having technical difficulties OR patient did not have access to video capability.  We continued and completed visit with audio only.  Some vital signs may be absent or patient reported.   Mary Rung, LPN Home Cardiac Risk Factors include: advanced age (>56men, >1 women)     Objective:    Today's Vitals   04/18/23 1052  Weight: 168 lb (76.2 kg)  Height: 5\' 6"  (1.676 m)   Body mass index is 27.12 kg/m.     04/18/2023   10:59 AM 04/15/2022   11:12 AM 03/02/2022    5:45 PM 04/09/2021    3:03 PM 04/01/2020   11:09 AM 02/28/2019   12:03 PM 02/14/2019    4:04 PM  Advanced Directives  Does Patient Have a Medical Advance Directive? Yes Yes Yes Yes Yes Yes No  Type of Estate agent of [place];Living will Healthcare Power of [place];Living will Living will;Healthcare Power of Attorney Living will;Healthcare Power of [employer] Power of [place];Living will Healthcare Power of [place];Living will   Does patient want to make changes to medical advance directive? No - Patient declined No - Patient declined  No - Patient declined No - Patient  declined No - Patient declined   Copy of Healthcare Power of Attorney in Chart? Yes - validated most recent copy scanned in chart (See row information) No - copy requested  No - copy requested  Yes - validated most recent copy scanned in chart (See row information)   Would patient like information on creating a medical advance directive?      No - Patient declined No - Patient declined    Current Medications (verified) Outpatient Encounter Medications as of 04/18/2023  Medication Sig   amoxicillin (AMOXIL) 500 MG capsule Take 4 tablets 1 hour prior to dental procedure   apixaban (ELIQUIS) 5 MG TABS tablet TAKE 1 TABLET BY MOUTH TWICE A DAY   Ascorbic Acid (VITAMIN C PO) Take 1 tablet by mouth daily.   atorvastatin (LIPITOR) 40 MG tablet Take 1 tablet (40 mg total) by mouth daily.   beta carotene 30 MG capsule Take 30 mg by mouth daily.   BORON PO Take by mouth.   calcium gluconate 500 MG tablet Take 1 tablet by mouth daily.   Calcium-Magnesium-Zinc (CAL-MAG-ZINC PO) Take by mouth.   co-enzyme Q-10 30 MG capsule Take 30 mg by mouth daily.   glucosamine-chondroitin 500-400 MG tablet Take 1 tablet by mouth daily.    metoprolol succinate (TOPROL-XL) 50 MG 24 hr tablet TAKE 1 TABLET BY MOUTH EVERY DAY   Multiple Vitamin (MULTIVITAMIN) tablet Take 1 tablet by mouth daily.   Omega-3 Fatty Acids (OMEGA 3 PO) Take 1 capsule by mouth daily.   SELENIUM PO Take 1  tablet by mouth daily.   Vitamin D, Cholecalciferol, 50 MCG (2000 UT) CAPS Take 2,000 Units by mouth daily.   vitamin E 400 UNIT capsule Take 400 Units by mouth daily.   No facility-administered encounter medications on file as of 04/18/2023.    Allergies (verified) Patient has no known allergies.   History: Past Medical History:  Diagnosis Date   Arthritis    Hyperlipidemia    Peripheral vascular disease (HCC)    MILD CAROTID DISEASE BY DOPPLER STUDY  11/13/12. -REPORT FROM [facility]   Seasonal  allergies    Past Surgical History:  Procedure Laterality Date   ATRIAL TACH ABLATION N/A 02/28/2019   Procedure: ATRIAL TACH ABLATION;  Surgeon: Marinus Maw, MD;  Location: [facility];  Service: Cardiovascular;  Laterality: N/A;   BREAST SURGERY     BREAST BIOPSIES X 2  - NO CANCER   congenitally absent uterus     DOPPLER ECHOCARDIOGRAPHY     LASER VOCAL CORD FOR NODULE  1985     TOTAL HIP ARTHROPLASTY  11/29/2012   Procedure: TOTAL HIP ARTHROPLASTY ANTERIOR APPROACH;  Surgeon: Loanne Drilling, MD;  Location: [facility];  Service: Orthopedics;  Laterality: Left;   Family History  Problem Relation Age of Onset   Arthritis Mother    Heart failure Father    Arthritis Sister    Heart disease Brother    Lung disease Neg Hx    Rheumatologic disease Neg Hx    Social History   Socioeconomic History   Marital status: Married    Spouse name: Not on file   Number of children: 0   Years of education: Not on file   Highest education level: Master's degree (e.g., MA, MS, MEng, MEd, MSW, MBA)  Occupational History   Occupation: retired  Tobacco Use   Smoking status: Never   Smokeless tobacco: Never  Vaping Use   Vaping Use: Never used  Substance and Sexual Activity   Alcohol use: Yes    Alcohol/week: 0.0 standard drinks of alcohol    Comment: OCCASIONAL ALCOHOL   Drug use: No   Sexual activity: Not on file  Other Topics Concern   Not on file  Social History Narrative   Originally from [place], [place]. She lived for 14 years in [place]. She does live in the summer in [place]. Previously lived in [place]. Has previous travel to [place], [place], & [place]. 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 She reports he did work with some rare exotic woods from [place] & [place]. She does report chemical exposure to the spray booth in his shop. No bird exposure. She does have a hot tub but uses it irregularly.    Social  Determinants of Health   Financial Resource Strain: Low Risk  (04/18/2023)   Overall Financial Resource Strain (CARDIA)    Difficulty of Paying Living Expenses: Not hard at all  Food Insecurity: No Food Insecurity (04/18/2023)   Hunger Vital Sign    Worried About Running Out of Food in the Last Year: Never true    Ran Out of Food in the Last Year: Never true  Transportation Needs: No Transportation Needs (04/18/2023)   PRAPARE - Administrator, Civil Service (Medical): No    Lack of Transportation (Non-Medical): No  Physical Activity: Inactive (04/18/2023)   Exercise Vital Sign    Days of Exercise per Week: 0  days    Minutes of Exercise per Session: 0 min  Stress: No Stress Concern Present (04/18/2023)   [employer] of Occupational Health - Occupational Stress Questionnaire    Feeling of Stress : Not at all  Social Connections: Socially Integrated (04/18/2023)   Social Connection and Isolation Panel [NHANES]    Frequency of Communication with Friends and Family: More than three times a week    Frequency of Social Gatherings with Friends and Family: More than three times a week    Attends Religious Services: More than 4 times per year    Active Member of [employer] or Organizations: Yes    Attends Engineer, structural: More than 4 times per year    Marital Status: Married    Tobacco Counseling Counseling given: Not Answered   Clinical Intake:  Pre-visit preparation completed: No  Pain : No/denies pain     BMI - recorded: 27.12 Nutritional Status: BMI 25 -29 Overweight Nutritional Risks: None Diabetes: No  How often do you need to have someone help you when you read instructions, pamphlets, or other written materials from your doctor or pharmacy?: 1 - Never  Diabetic?  No  Interpreter Needed?: No  Information entered by :: Theresa Mulligan LPN   Activities of Daily Living    04/18/2023   10:58 AM  In your present state of health, do you have any  difficulty performing the following activities:  Hearing? 1  Comment Wears hearing aids  Vision? 0  Difficulty concentrating or making decisions? 0  Walking or climbing stairs? 0  Dressing or bathing? 0  Doing errands, shopping? 0  Preparing Food and eating ? N  Using the Toilet? N  In the past six months, have you accidently leaked urine? N  Do you have problems with loss of bowel control? N  Managing your Medications? N  Managing your Finances? N  Housekeeping or managing your Housekeeping? N    Patient Care Team: Kristian Covey, MD as PCP - General (Family Medicine) Rennis Golden Lisette Abu, MD as PCP - Cardiology (Cardiology)  Indicate any recent Medical Services you may have received from other than [facility] providers in the past year (date may be approximate).     Assessment:   This is a routine wellness examination for [place].  Hearing/Vision screen Hearing Screening - Comments:: Wears hearing aids Vision Screening - Comments::  - up to date with routine eye exams with  [facility]  Dietary issues and exercise activities discussed: Exercise limited by: None identified   Goals Addressed               This Visit's Progress     Stay Healthy (pt-stated)         Depression Screen    04/18/2023   10:56 AM 01/17/2023   12:05 PM 04/15/2022   11:03 AM 01/15/2022   11:38 AM 04/09/2021    3:06 PM 04/09/2021    3:00 PM 03/09/2017   11:52 AM  PHQ 2/9 Scores  PHQ - 2 Score 0 0 0 0 0 0 0    Fall Risk    04/18/2023   10:58 AM 01/17/2023   12:05 PM 09/26/2022    3:03 PM 04/15/2022   11:09 AM 04/15/2022    9:20 AM  Fall Risk   Falls in the past year? 0 0 1 1 1   Number falls in past yr: 0 0 1 0 0  Injury with Fall? 0 0 0 0 0  Comment  No injury or medical attention needed   Risk for fall due to : No Fall Risks No Fall Risks  No Fall Risks   Follow up Falls prevention discussed Falls evaluation completed       FALL RISK PREVENTION PERTAINING TO THE HOME:  Any stairs  in or around the home? Yes  If so, are there any without handrails? No  Home free of loose throw rugs in walkways, pet beds, electrical cords, etc? Yes  Adequate lighting in your home to reduce risk of falls? Yes   ASSISTIVE DEVICES UTILIZED TO PREVENT FALLS:  Life alert? No  Use of a cane, walker or w/c? No  Grab bars in the bathroom? Yes  Shower chair or bench in shower? Yes  Elevated toilet seat or a handicapped toilet? Yes   TIMED UP AND GO:  Was the test performed? No . Audio Visit   Cognitive Function:        04/18/2023   10:59 AM 04/15/2022   11:12 AM  6CIT Screen  What Year? 0 points 0 points  What month? 0 points 0 points  What time? 0 points 0 points  Count back from 20 0 points 0 points  Months in reverse 0 points 0 points  Repeat phrase 0 points 0 points  Total Score 0 points 0 points    Immunizations Immunization History  Administered Date(s) Administered   COVID-19, mRNA, vaccine(Comirnaty)12 years and older 10/11/2022   Fluad Quad(high Dose 65+) 10/11/2022   Hepatitis A, Adult 01/02/2019   Influenza Inj Mdck Quad Pf 09/06/2018   Influenza Split 11/17/2011, 11/20/2012   Influenza, High Dose Seasonal PF 11/15/2014, 12/05/2015, 11/02/2016, 10/22/2017, 09/06/2018   Influenza,inj,Quad PF,6+ Mos 11/21/2013, 10/01/2019   Influenza,inj,quad, With Preservative 09/06/2018   Influenza-Unspecified 09/24/2020, 11/04/2021   Moderna Sars-Covid-2 Vaccination 01/17/2020, 02/14/2020   Pneumococcal Conjugate-13 11/21/2013   Pneumococcal Polysaccharide-23 01/17/2009   Td 05/20/1997, 01/17/2009   Zoster Recombinat (Shingrix) 04/22/2017, 04/22/2017   Zoster, Live 04/04/2014    TDAP status: Due, Education has been provided regarding the importance of this vaccine. Advised may receive this vaccine at local pharmacy or Health Dept. Aware to provide a copy of the vaccination record if obtained from local pharmacy or Health Dept. Verbalized acceptance and  understanding.  Flu Vaccine status: Up to date  Pneumococcal vaccine status: Up to date  Covid-19 vaccine status: Completed vaccines  Qualifies for Shingles Vaccine? Yes   Zostavax completed Yes   Shingrix Completed?: Yes  Screening Tests Health Maintenance  Topic Date Due   DTaP/Tdap/Td (3 - Tdap) 01/17/2019   COVID-19 Vaccine (4 - 2023-24 season) 05/04/2023 (Originally 12/06/2022)   INFLUENZA VACCINE  07/21/2023   Medicare Annual Wellness (AWV)  04/17/2024   Pneumonia Vaccine 99+ Years old  Completed   DEXA SCAN  Completed   HPV VACCINES  Aged Out   Zoster Vaccines- Shingrix  Discontinued    Health Maintenance  Health Maintenance Due  Topic Date Due   DTaP/Tdap/Td (3 - Tdap) 01/17/2019    Colorectal cancer screening: No longer required.   Mammogram status: No longer required due to Age.  Bone Density status: Ordered 09/28/22. Pt provided with contact info and advised to call to schedule appt.  Lung Cancer Screening: (Low Dose CT Chest recommended if Age 55-80 years, 30 pack-year currently smoking OR have quit w/in 15years.) does not qualify.     Additional Screening:  Hepatitis C Screening: does not qualify; Completed   Vision Screening: Recommended annual ophthalmology exams for early  detection of glaucoma and other disorders of the eye. Is the patient up to date with their annual eye exam?  Yes  Who is the provider or what is the name of the office in which the patient attends annual eye exams? [facility] If pt is not established with a provider, would they like to be referred to a provider to establish care? No .   Dental Screening: Recommended annual dental exams for proper oral hygiene  Community Resource Referral / Chronic Care Management:  CRR required this visit?  No   CCM required this visit?  No      Plan:     I have personally reviewed and noted the following in the patient's chart:   Medical and social history Use of alcohol, tobacco  or illicit drugs  Current medications and supplements including opioid prescriptions. Patient is not currently taking opioid prescriptions. Functional ability and status Nutritional status Physical activity Advanced directives List of other physicians Hospitalizations, surgeries, and ER visits in previous 12 months Vitals Screenings to include cognitive, depression, and falls Referrals and appointments  In addition, I have reviewed and discussed with patient certain preventive protocols, quality metrics, and best practice recommendations. A written personalized care plan for preventive services as well as general preventive health recommendations were provided to patient.     Mary Rung, LPN   1/61/0960   Nurse Notes: None

## 2023-04-18 NOTE — Patient Instructions (Addendum)
[name] , Thank you for taking time to come for your Medicare Wellness Visit. I appreciate your ongoing commitment to your health goals. Please review the following plan we discussed and let me know if I can assist you in the future.   These are the goals we discussed:  Goals       Exercise 3x per week (30 min per time)      Lose more weight (pt-stated)      Stay Healthy (pt-stated)        This is a list of the screening recommended for you and due dates:  Health Maintenance  Topic Date Due   DTaP/Tdap/Td vaccine (3 - Tdap) 01/17/2019   COVID-19 Vaccine (4 - 2023-24 season) 05/04/2023*   Flu Shot  07/21/2023   Medicare Annual Wellness Visit  04/17/2024   Pneumonia Vaccine  Completed   DEXA scan (bone density measurement)  Completed   HPV Vaccine  Aged Out   Zoster (Shingles) Vaccine  Discontinued  *Topic was postponed. The date shown is not the original due date.    Advanced directives: In chart  Conditions/risks identified: None  Next appointment: Follow up in one year for your annual wellness visit    Preventive Care 65 Years and Older, Female Preventive care refers to lifestyle choices and visits with your health care provider that can promote health and wellness. What does preventive care include? A yearly physical exam. This is also called an annual well check. Dental exams once or twice a year. Routine eye exams. Ask your health care provider how often you should have your eyes checked. Personal lifestyle choices, including: Daily care of your teeth and gums. Regular physical activity. Eating a healthy diet. Avoiding tobacco and drug use. Limiting alcohol use. Practicing safe sex. Taking low-dose aspirin every day. Taking vitamin and mineral supplements as recommended by your health care provider. What happens during an annual well check? The services and screenings done by your health care provider during your annual well check will depend on your  age, overall health, lifestyle risk factors, and family history of disease. Counseling  Your health care provider may ask you questions about your: Alcohol use. Tobacco use. Drug use. Emotional well-being. Home and relationship well-being. Sexual activity. Eating habits. History of falls. Memory and ability to understand (cognition). Work and work Astronomer. Reproductive health. Screening  You may have the following tests or measurements: Height, weight, and BMI. Blood pressure. Lipid and cholesterol levels. These may be checked every 5 years, or more frequently if you are over 54 years old. Skin check. Lung cancer screening. You may have this screening every year starting at age 40 if you have a 30-pack-year history of smoking and currently smoke or have quit within the past 15 years. Fecal occult blood test (FOBT) of the stool. You may have this test every year starting at age 67. Flexible sigmoidoscopy or colonoscopy. You may have a sigmoidoscopy every 5 years or a colonoscopy every 10 years starting at age 54. Hepatitis C blood test. Hepatitis B blood test. Sexually transmitted disease (STD) testing. Diabetes screening. This is done by checking your blood sugar (glucose) after you have not eaten for a while (fasting). You may have this done every 1-3 years. Bone density scan. This is done to screen for osteoporosis. You may have this done starting at age 83. Mammogram. This may be done every 1-2 years. Talk to your health care provider about how often you should have regular mammograms.  Talk with your health care provider about your test results, treatment options, and if necessary, the need for more tests. Vaccines  Your health care provider may recommend certain vaccines, such as: Influenza vaccine. This is recommended every year. Tetanus, diphtheria, and acellular pertussis (Tdap, Td) vaccine. You may need a Td booster every 10 years. Zoster vaccine. You may need this after  age 42. Pneumococcal 13-valent conjugate (PCV13) vaccine. One dose is recommended after age 30. Pneumococcal polysaccharide (PPSV23) vaccine. One dose is recommended after age 64. Talk to your health care provider about which screenings and vaccines you need and how often you need them. This information is not intended to replace advice given to you by your health care provider. Make sure you discuss any questions you have with your health care provider. Document Released: 01/02/2016 Document Revised: 08/25/2016 Document Reviewed: 10/07/2015 Elsevier Interactive Patient Education  2017 [place] Prevention in the Home Falls can cause injuries. They can happen to people of all ages. There are many things you can do to make your home safe and to help prevent falls. What can I do on the outside of my home? Regularly fix the edges of walkways and driveways and fix any cracks. Remove anything that might make you trip as you walk through a door, such as a raised step or threshold. Trim any bushes or trees on the path to your home. Use bright outdoor lighting. Clear any walking paths of anything that might make someone trip, such as rocks or tools. Regularly check to see if handrails are loose or broken. Make sure that both sides of any steps have handrails. Any raised decks and porches should have guardrails on the edges. Have any leaves, snow, or ice cleared regularly. Use sand or salt on walking paths during winter. Clean up any spills in your garage right away. This includes oil or grease spills. What can I do in the bathroom? Use night lights. Install grab bars by the toilet and in the tub and shower. Do not use towel bars as grab bars. Use non-skid mats or decals in the tub or shower. If you need to sit down in the shower, use a plastic, non-slip stool. Keep the floor dry. Clean up any water that spills on the floor as soon as it happens. Remove soap buildup in the tub or shower  regularly. Attach bath mats securely with double-sided non-slip rug tape. Do not have throw rugs and other things on the floor that can make you trip. What can I do in the bedroom? Use night lights. Make sure that you have a light by your bed that is easy to reach. Do not use any sheets or blankets that are too big for your bed. They should not hang down onto the floor. Have a firm chair that has side arms. You can use this for support while you get dressed. Do not have throw rugs and other things on the floor that can make you trip. What can I do in the kitchen? Clean up any spills right away. Avoid walking on wet floors. Keep items that you use a lot in easy-to-reach places. If you need to reach something above you, use a strong step stool that has a grab bar. Keep electrical cords out of the way. Do not use floor polish or wax that makes floors slippery. If you must use wax, use non-skid floor wax. Do not have throw rugs and other things on the floor that can make  you trip. What can I do with my stairs? Do not leave any items on the stairs. Make sure that there are handrails on both sides of the stairs and use them. Fix handrails that are broken or loose. Make sure that handrails are as long as the stairways. Check any carpeting to make sure that it is firmly attached to the stairs. Fix any carpet that is loose or worn. Avoid having throw rugs at the top or bottom of the stairs. If you do have throw rugs, attach them to the floor with carpet tape. Make sure that you have a light switch at the top of the stairs and the bottom of the stairs. If you do not have them, ask someone to add them for you. What else can I do to help prevent falls? Wear shoes that: Do not have high heels. Have rubber bottoms. Are comfortable and fit you well. Are closed at the toe. Do not wear sandals. If you use a stepladder: Make sure that it is fully opened. Do not climb a closed stepladder. Make sure that  both sides of the stepladder are locked into place. Ask someone to hold it for you, if possible. Clearly mark and make sure that you can see: Any grab bars or handrails. First and last steps. Where the edge of each step is. Use tools that help you move around (mobility aids) if they are needed. These include: Canes. Walkers. Scooters. Crutches. Turn on the lights when you go into a dark area. Replace any light bulbs as soon as they burn out. Set up your furniture so you have a clear path. Avoid moving your furniture around. If any of your floors are uneven, fix them. If there are any pets around you, be aware of where they are. Review your medicines with your doctor. Some medicines can make you feel dizzy. This can increase your chance of falling. Ask your doctor what other things that you can do to help prevent falls. This information is not intended to replace advice given to you by your health care provider. Make sure you discuss any questions you have with your health care provider. Document Released: 10/02/2009 Document Revised: 05/13/2016 Document Reviewed: 01/10/2015 Elsevier Interactive Patient Education  2017 [employer].

## 2023-04-20 ENCOUNTER — Encounter: Payer: Self-pay | Admitting: Internal Medicine

## 2023-04-20 ENCOUNTER — Ambulatory Visit: Payer: Medicare Other | Attending: Internal Medicine | Admitting: Internal Medicine

## 2023-04-20 VITALS — BP 122/62 | HR 86 | Ht 66.0 in | Wt 173.0 lb

## 2023-04-20 DIAGNOSIS — E785 Hyperlipidemia, unspecified: Secondary | ICD-10-CM | POA: Diagnosis not present

## 2023-04-20 DIAGNOSIS — I4891 Unspecified atrial fibrillation: Secondary | ICD-10-CM

## 2023-04-20 DIAGNOSIS — Z7901 Long term (current) use of anticoagulants: Secondary | ICD-10-CM | POA: Diagnosis not present

## 2023-04-20 MED ORDER — RIVAROXABAN 20 MG PO TABS: 20.0000 mg | ORAL_TABLET | Freq: Every day | ORAL | 0 refills | Status: DC

## 2023-04-20 MED ORDER — RIVAROXABAN 20 MG PO TABS: 20.0000 mg | ORAL_TABLET | Freq: Every day | ORAL | 3 refills | Status: DC

## 2023-04-20 NOTE — Patient Instructions (Signed)
Medication Instructions:  STOP Eliquis START Xarelto 20mg  daily -- take with largest meal of day, consistent time each day  *If you need a refill on your cardiac medications before your next appointment, please call your pharmacy*   Follow-Up: At [facility], you and your health needs are our priority.  As part of our continuing mission to provide you with exceptional heart care, we have created designated Provider Care Teams.  These Care Teams include your primary Cardiologist (physician) and Advanced Practice Providers (APPs -  Physician Assistants and Nurse Practitioners) who all work together to provide you with the care you need, when you need it.  We recommend signing up for the patient portal called "MyChart".  Sign up information is provided on this After Visit Summary.  MyChart is used to connect with patients for Virtual Visits (Telemedicine).  Patients are able to view lab/test results, encounter notes, upcoming appointments, etc.  Non-urgent messages can be sent to your provider as well.   To learn more about what you can do with MyChart, go to [email or web address].    Your next appointment:    12 months with Dr. Rennis Golden
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
## 2023-04-21 ENCOUNTER — Other Ambulatory Visit (HOSPITAL_COMMUNITY): Payer: Self-pay

## 2023-04-21 ENCOUNTER — Telehealth: Payer: Self-pay

## 2023-04-21 NOTE — Telephone Encounter (Signed)
-----   Message from Carolan Clines, CPhT sent at 04/21/2023 11:45 AM EDT ----- Regarding: RE: check fo xarelto PA? No p/a is needed but it is expensive. For a supply the cost is around/about 360.00 ----- Message ----- From: Lindell Spar, RN Sent: 04/20/2023   4:25 PM EDT To: Rx Prior Auth Team Subject: check fo xarelto PA?                           Hi! Can you check to see if a Xarelto PA is needed? I gave her samples, but she is leaving for [place] for several months next week.  Thanks

## 2023-07-15 ENCOUNTER — Other Ambulatory Visit: Payer: Self-pay | Admitting: Internal Medicine

## 2023-09-09 ENCOUNTER — Other Ambulatory Visit: Payer: Self-pay | Admitting: Internal Medicine

## 2024-01-20 ENCOUNTER — Encounter: Payer: Medicare Other | Admitting: Family Medicine

## 2024-02-14 ENCOUNTER — Ambulatory Visit (INDEPENDENT_AMBULATORY_CARE_PROVIDER_SITE_OTHER): Payer: Medicare Other | Admitting: Family Medicine

## 2024-02-14 ENCOUNTER — Encounter: Payer: Self-pay | Admitting: Family Medicine

## 2024-02-14 VITALS — BP 132/86 | HR 88 | Temp 97.7°F | Ht 66.0 in | Wt 174.5 lb

## 2024-02-14 DIAGNOSIS — Z0000 Encounter for general adult medical examination without abnormal findings: Secondary | ICD-10-CM | POA: Diagnosis not present

## 2024-02-14 DIAGNOSIS — E7800 Pure hypercholesterolemia, unspecified: Secondary | ICD-10-CM

## 2024-02-14 DIAGNOSIS — Z79899 Other long term (current) drug therapy: Secondary | ICD-10-CM

## 2024-02-14 LAB — CBC WITH DIFFERENTIAL/PLATELET
Basophils Absolute: 0 10*3/uL (ref 0.0–0.1)
Basophils Relative: 0.8 % (ref 0.0–3.0)
Eosinophils Absolute: 0.1 10*3/uL (ref 0.0–0.7)
Eosinophils Relative: 2 % (ref 0.0–5.0)
HCT: 40.4 % (ref 36.0–46.0)
Hemoglobin: 13.1 g/dL (ref 12.0–15.0)
Lymphocytes Relative: 15.5 % (ref 12.0–46.0)
Lymphs Abs: 0.9 10*3/uL (ref 0.7–4.0)
MCHC: 32.5 g/dL (ref 30.0–36.0)
MCV: 93.3 fL (ref 78.0–100.0)
Monocytes Absolute: 0.5 10*3/uL (ref 0.1–1.0)
Monocytes Relative: 8.2 % (ref 3.0–12.0)
Neutro Abs: 4 10*3/uL (ref 1.4–7.7)
Neutrophils Relative %: 73.5 % (ref 43.0–77.0)
Platelets: 161 10*3/uL (ref 150.0–400.0)
RBC: 4.33 Mil/uL (ref 3.87–5.11)
RDW: 12.7 % (ref 11.5–15.5)
WBC: 5.5 10*3/uL (ref 4.0–10.5)

## 2024-02-14 LAB — LIPID PANEL
Cholesterol: 138 mg/dL (ref 0–200)
HDL: 59.4 mg/dL (ref >39.00)
LDL Cholesterol: 61 mg/dL (ref 0–99)
NonHDL: 78.67
Total CHOL/HDL Ratio: 2
Triglycerides: 90 mg/dL (ref 0.0–149.0)
VLDL: 18 mg/dL (ref 0.0–40.0)

## 2024-02-14 LAB — COMPREHENSIVE METABOLIC PANEL
ALT: 15 U/L (ref 0–35)
AST: 22 U/L (ref 0–37)
Albumin: 4.1 g/dL (ref 3.5–5.2)
Alkaline Phosphatase: 77 U/L (ref 39–117)
BUN: 16 mg/dL (ref 6–23)
CO2: 31 meq/L (ref 19–32)
Calcium: 9.3 mg/dL (ref 8.4–10.5)
Chloride: 102 meq/L (ref 96–112)
Creatinine, Ser: 0.62 mg/dL (ref 0.40–1.20)
GFR: 78.8 mL/min (ref >60.00)
Glucose, Bld: 80 mg/dL (ref 70–99)
Potassium: 4.2 meq/L (ref 3.5–5.1)
Sodium: 140 meq/L (ref 135–145)
Total Bilirubin: 0.6 mg/dL (ref 0.2–1.2)
Total Protein: 6.9 g/dL (ref 6.0–8.3)

## 2024-02-14 NOTE — Patient Instructions (Signed)
 Consider RSV vaccine at some point this year.

## 2024-02-14 NOTE — Progress Notes (Signed)
 Established Patient Office Visit  Subjective   Patient ID: Mary Summers, female    DOB: 04-20-1932  Age: 89 y.o. MRN: [id number]  Chief Complaint  Patient presents with   Annual Exam    HPI   Mary Summers is seen today accompanied by her husband.  She is here for physical exam.  Continues to do well.  She and her husband still travel quite often.  They have a second home in [place].  They just got back from 2-week trip to [place].  Her past medical history is significant for previous paroxysmal supraventricular tachycardia with prior ablation procedure, osteoarthritis, history of sarcoidosis, hyperlipidemia.  She remains on Xarelto 20 mg daily per cardiology.  Also takes metoprolol extended release 50 mg 1 daily and atorvastatin 40 mg daily.  She is due for follow-up labs at this time.  Maintenance reviewed:  -She gets annual flu vaccine -No history of RSV vaccine -Pneumonia vaccines complete -Aged out of further colonoscopy -She still does get annual mammogram  Family history-reviewed with no change.  Her father had heart failure.  Mother osteoarthritis.  Brother had heart disease   Social history-married.  Non-smoker.  No regular alcohol.  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  She and her husband have a second home in [place] and spend a good part of the year there  Past Medical History:  Diagnosis Date   Arthritis    Hyperlipidemia    Peripheral vascular disease (HCC)    MILD CAROTID DISEASE BY DOPPLER STUDY  11/13/12. -REPORT FROM DR. HILTY-[facility]   Seasonal allergies    Past Surgical History:  Procedure Laterality Date   ATRIAL TACH ABLATION N/A 02/28/2019   Procedure: ATRIAL TACH ABLATION;  Surgeon: Marinus Maw, MD;  Location: [facility];  Service: Cardiovascular;  Laterality: N/A;   BREAST SURGERY     BREAST BIOPSIES X 2  - NO CANCER   congenitally absent uterus     DOPPLER ECHOCARDIOGRAPHY     LASER VOCAL  CORD FOR NODULE  1985     TOTAL HIP ARTHROPLASTY  11/29/2012   Procedure: TOTAL HIP ARTHROPLASTY ANTERIOR APPROACH;  Surgeon: Loanne Drilling, MD;  Location: [facility];  Service: Orthopedics;  Laterality: Left;    reports that she has never smoked. She has never used smokeless tobacco. She reports current alcohol use. She reports that she does not use drugs. family history includes Arthritis in her mother and sister; Heart disease in her brother; Heart failure in her father. No Known Allergies     Review of Systems  Constitutional:  Negative for chills, fever, malaise/fatigue and weight loss.  Eyes:  Negative for blurred vision.  Respiratory:  Negative for shortness of breath.   Cardiovascular:  Negative for chest pain.  Gastrointestinal:  Negative for abdominal pain.  Genitourinary:  Negative for dysuria.  Neurological:  Negative for dizziness, weakness and headaches.      Objective:     BP 132/86 (BP Location: Left Arm, Patient Position: Sitting, Cuff Size: Normal)   Pulse 88   Temp 97.7 F (36.5 C) (Oral)   Ht 5\' 6"  (1.676 m)   Wt 174 lb 8 oz (79.2 kg)   SpO2 97%   BMI 28.17 kg/m  BP Readings from Last 3 Encounters:  02/14/24 132/86  04/20/23 122/62  04/01/23 120/74   Wt Readings from Last 3 Encounters:  02/14/24 174 lb 8 oz (79.2 kg)  04/20/23 173 lb (78.5 kg)  04/18/23 168 lb (76.2 kg)      Physical Exam Vitals reviewed.  Constitutional:      General: She is not in acute distress.    Appearance: She is well-developed. She is not ill-appearing.  HENT:     Right Ear: Tympanic membrane normal.     Left Ear: Tympanic membrane normal.  Eyes:     Pupils: Pupils are equal, round, and reactive to light.  Neck:     Thyroid: No thyromegaly.     Vascular: No JVD.  Cardiovascular:     Rate and Rhythm: Normal rate and regular rhythm.     Heart sounds:     No gallop.  Pulmonary:     Effort: Pulmonary effort is normal. No respiratory distress.     Breath sounds:  Normal breath sounds. No wheezing or rales.  Musculoskeletal:     Cervical back: Neck supple.     Right lower leg: No edema.     Left lower leg: No edema.  Neurological:     Mental Status: She is alert.      No results found for any visits on 02/14/24.  Last CBC Lab Results  Component Value Date   WBC 6.7 01/17/2023   HGB 14.4 01/17/2023   HCT 42.6 01/17/2023   MCV 92.2 01/17/2023   MCH 30.7 03/17/2022   RDW 13.0 01/17/2023   PLT 170.0 01/17/2023   Last metabolic panel Lab Results  Component Value Date   GLUCOSE 89 01/17/2023   NA 139 01/17/2023   K 4.3 01/17/2023   CL 103 01/17/2023   CO2 29 01/17/2023   BUN 14 01/17/2023   CREATININE 0.62 01/17/2023   GFR 79.40 01/17/2023   CALCIUM 9.2 01/17/2023   PROT 7.1 01/17/2023   ALBUMIN 4.2 01/17/2023   BILITOT 0.7 01/17/2023   ALKPHOS 81 01/17/2023   AST 20 01/17/2023   ALT 16 01/17/2023   ANIONGAP 8 03/17/2022   Last lipids Lab Results  Component Value Date   CHOL 150 01/17/2023   HDL 66.30 01/17/2023   LDLCALC 63 01/17/2023   LDLDIRECT 179.3 11/17/2011   TRIG 107.0 01/17/2023   CHOLHDL 2 01/17/2023      The ASCVD Risk score (Arnett DK, et al., 2019) failed to calculate for the following reasons:   The 2019 ASCVD risk score is only valid for ages 55 to 68    Assessment & Plan:   89 year old female who remains very active and functional with past history of PSVT, sarcoidosis, hyperlipidemia, osteoarthritis.  Doing very well overall.  Still travels quite often.  We discussed the following health maintenance items  -Obtain follow-up labs including lipid, CMP, CBC -Continue regular follow-up with cardiology -Continue annual flu vaccine -We did recommend she consider RSV vaccine given her age -She plans to continue with annual mammography -Aged out of Pap smears and colonoscopy   No follow-ups on file.    Mary Peat, MD

## 2024-03-06 ENCOUNTER — Other Ambulatory Visit: Payer: Self-pay | Admitting: Family Medicine

## 2024-03-06 DIAGNOSIS — Z0000 Encounter for general adult medical examination without abnormal findings: Secondary | ICD-10-CM

## 2024-03-27 ENCOUNTER — Ambulatory Visit
Admission: RE | Admit: 2024-03-27 | Discharge: 2024-03-27 | Disposition: A | Discharge status: Home / Self Care | Source: Ambulatory Visit | Attending: Family Medicine | Admitting: Family Medicine

## 2024-03-27 DIAGNOSIS — Z0000 Encounter for general adult medical examination without abnormal findings: Secondary | ICD-10-CM

## 2024-03-28 ENCOUNTER — Other Ambulatory Visit: Payer: Self-pay | Admitting: Family Medicine

## 2024-03-28 ENCOUNTER — Telehealth: Payer: Self-pay

## 2024-03-28 DIAGNOSIS — R928 Other abnormal and inconclusive findings on diagnostic imaging of breast: Secondary | ICD-10-CM

## 2024-03-28 NOTE — Telephone Encounter (Signed)
 Copied from CRM [id number]. Topic: Clinical - Request for Lab/Test Order >> Mar 28, 2024 10:18 AM Bobbye Morton wrote: Reason for CRM: Pt came in on 04/08 for Mammogram, Based on results she is requested to return 04/10 for diagnostic imaging office faxed over an urgent form to sign off on or go into epic and sign order. orders need to be signed before 10:30AM   If any further questions are needed please contact [facility] Direct - [phone] ext. [phone]

## 2024-03-28 NOTE — Telephone Encounter (Signed)
 Noted.

## 2024-03-29 ENCOUNTER — Ambulatory Visit
Admission: RE | Admit: 2024-03-29 | Discharge: 2024-03-29 | Discharge status: Home / Self Care | Source: Ambulatory Visit | Attending: Family Medicine | Admitting: Family Medicine

## 2024-03-29 ENCOUNTER — Ambulatory Visit
Admission: RE | Admit: 2024-03-29 | Discharge: 2024-03-29 | Disposition: A | Discharge status: Home / Self Care | Source: Ambulatory Visit | Attending: Pulmonary Disease | Admitting: Pulmonary Disease

## 2024-03-29 ENCOUNTER — Ambulatory Visit

## 2024-03-29 DIAGNOSIS — R928 Other abnormal and inconclusive findings on diagnostic imaging of breast: Secondary | ICD-10-CM

## 2024-03-29 DIAGNOSIS — D869 Sarcoidosis, unspecified: Secondary | ICD-10-CM

## 2024-04-14 ENCOUNTER — Other Ambulatory Visit: Payer: Self-pay | Admitting: Internal Medicine

## 2024-04-17 ENCOUNTER — Telehealth: Payer: Self-pay | Admitting: Internal Medicine

## 2024-04-17 MED ORDER — RIVAROXABAN 20 MG PO TABS: 20.0000 mg | ORAL_TABLET | Freq: Every day | ORAL | 1 refills | Status: DC

## 2024-04-17 NOTE — Telephone Encounter (Signed)
*  STAT* If patient is at the pharmacy, call can be transferred to refill team.   1. Which medications need to be refilled? (please list name of each medication and dose if known)   rivaroxaban  (XARELTO ) 20 MG TABS tablet   2. Would you like to learn more about the convenience, safety, & potential cost savings by using the [facility]?   3. Are you open to using the [facility] (Type [facility]. ).   4. Which pharmacy/location (including street and city if local pharmacy) is medication to be sent to?  [id number] - [place], [place] - [address]   5. Do they need a 30 day or 90 day supply?   Husband stated patient is completely out of this medication.  Patient has appointment scheduled on 5/6.

## 2024-04-17 NOTE — Telephone Encounter (Signed)
 Prescription refill request for Xarelto  received.  Indication: AF Last office visit: 04/20/23  Claudell Cruz MD  (Appt 04/24/24) Weight: 78.5kg Age: 89 Scr: 0.62 on 02/14/24  Epic CrCl: 76.23  Based on above findings Xarelto  20mg  daily is the appropriate dose.  Refill approved.

## 2024-04-19 ENCOUNTER — Ambulatory Visit: Payer: Medicare Other | Admitting: Adult Health

## 2024-04-23 ENCOUNTER — Ambulatory Visit (INDEPENDENT_AMBULATORY_CARE_PROVIDER_SITE_OTHER)

## 2024-04-23 VITALS — Ht 66.0 in | Wt 167.0 lb

## 2024-04-23 DIAGNOSIS — Z0000 Encounter for general adult medical examination without abnormal findings: Secondary | ICD-10-CM

## 2024-04-23 NOTE — Progress Notes (Signed)
 Subjective:   Mary Summers is a 89 y.o. who presents for a Medicare Wellness preventive visit.  Visit Complete: Virtual I connected with  Judene Noss on 04/23/24 by a video and audio enabled telemedicine application and verified that I am speaking with the correct person using two identifiers.  Patient Location: Home  Provider Location: Home Office  I discussed the limitations of evaluation and management by telemedicine. The patient expressed understanding and agreed to proceed.  Vital Signs: Because this visit was a virtual/telehealth visit, some criteria may be missing or patient reported. Any vitals not documented were not able to be obtained and vitals that have been documented are patient reported.    Persons Participating in Visit: Patient.  AWV Questionnaire: No: Patient Medicare AWV questionnaire was not completed prior to this visit.  Cardiac Risk Factors include: advanced age (>18men, >63 women)     Objective:    Today's Vitals   04/23/24 1127  Weight: 167 lb (75.8 kg)  Height: 5\' 6"  (1.676 m)   Body mass index is 26.95 kg/m.     04/23/2024   11:35 AM 04/18/2023   10:59 AM 04/15/2022   11:12 AM 03/02/2022    5:45 PM 04/09/2021    3:03 PM 04/01/2020   11:09 AM 02/28/2019   12:03 PM  Advanced Directives  Does Patient Have a Medical Advance Directive? Yes Yes Yes Yes Yes Yes Yes  Type of Estate agent of [place];Living will Healthcare Power of [place];Living will Healthcare Power of [place];Living will Living will;Healthcare Power of Attorney Living will;Healthcare Power of [employer] Power of [place];Living will Healthcare Power of [place];Living will  Does patient want to make changes to medical advance directive? No - Patient declined No - Patient declined No - Patient declined  No - Patient declined No - Patient declined No - Patient declined  Copy of Healthcare Power of Attorney in Chart? Yes - validated most  recent copy scanned in chart (See row information) Yes - validated most recent copy scanned in chart (See row information) No - copy requested  No - copy requested  Yes - validated most recent copy scanned in chart (See row information)  Would patient like information on creating a medical advance directive?       No - Patient declined    Current Medications (verified) Outpatient Encounter Medications as of 04/23/2024  Medication Sig   amoxicillin  (AMOXIL ) 500 MG capsule Take 4 tablets 1 hour prior to dental procedure (Patient not taking: Reported on 02/14/2024)   Ascorbic Acid (VITAMIN C PO) Take 1 tablet by mouth daily.   atorvastatin  (LIPITOR) 40 MG tablet TAKE 1 TABLET BY MOUTH EVERY DAY   beta carotene 30 MG capsule Take 30 mg by mouth daily.   BORON PO Take by mouth.   calcium  gluconate 500 MG tablet Take 1 tablet by mouth daily.   Calcium -Magnesium-Zinc (CAL-MAG-ZINC PO) Take by mouth.   co-enzyme Q-10 30 MG capsule Take 30 mg by mouth daily.   glucosamine-chondroitin 500-400 MG tablet Take 1 tablet by mouth daily.    metoprolol  succinate (TOPROL -XL) 50 MG 24 hr tablet TAKE 1 TABLET BY MOUTH EVERY DAY   Multiple Vitamin (MULTIVITAMIN) tablet Take 1 tablet by mouth daily.   Omega-3 Fatty Acids (OMEGA 3 PO) Take 1 capsule by mouth daily.   rivaroxaban  (XARELTO ) 20 MG TABS tablet Take 1 tablet (20 mg total) by mouth daily with supper.   SELENIUM PO Take 1 tablet by mouth daily.  Vitamin D, Cholecalciferol, 50 MCG (2000 UT) CAPS Take 2,000 Units by mouth daily.   vitamin E 400 UNIT capsule Take 400 Units by mouth daily.   No facility-administered encounter medications on file as of 04/23/2024.    Allergies (verified) Patient has no known allergies.   History: Past Medical History:  Diagnosis Date   Arthritis    Hyperlipidemia    Peripheral vascular disease (HCC)    MILD CAROTID DISEASE BY DOPPLER STUDY  11/13/12. -REPORT FROM [facility]    Seasonal allergies    Past Surgical History:  Procedure Laterality Date   ATRIAL TACH ABLATION N/A 02/28/2019   Procedure: ATRIAL TACH ABLATION;  Surgeon: Tammie Fall, MD;  Location: [facility];  Service: Cardiovascular;  Laterality: N/A;   BREAST SURGERY     BREAST BIOPSIES X 2  - NO CANCER   congenitally absent uterus     DOPPLER ECHOCARDIOGRAPHY     LASER VOCAL CORD FOR NODULE  1985     TOTAL HIP ARTHROPLASTY  11/29/2012   Procedure: TOTAL HIP ARTHROPLASTY ANTERIOR APPROACH;  Surgeon: Aurther Blue, MD;  Location: [facility];  Service: Orthopedics;  Laterality: Left;   Family History  Problem Relation Age of Onset   Arthritis Mother    Heart failure Father    Arthritis Sister    Heart disease Brother    Lung disease Neg Hx    Rheumatologic disease Neg Hx    Social History   Socioeconomic History   Marital status: Married    Spouse name: Not on file   Number of children: 0   Years of education: Not on file   Highest education level: Professional school degree (e.g., MD, DDS, DVM, JD)  Occupational History   Occupation: retired  Tobacco Use   Smoking status: Never   Smokeless tobacco: Never  Vaping Use   Vaping status: Never Used  Substance and Sexual Activity   Alcohol use: Yes    Alcohol/week: 0.0 standard drinks of alcohol    Comment: OCCASIONAL ALCOHOL   Drug use: No   Sexual activity: Not on file  Other Topics Concern   Not on file  Social History Narrative   Originally from [place], [place]. She lived for 14 years in [place]. She does live in the summer in [place]. Previously lived in [place]. Has previous travel to [place], [place], & [place]. 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 She reports he did work with some rare exotic woods from [place] & [place]. She does report chemical exposure to the spray booth in his shop. No bird exposure. She does have a hot tub but uses it irregularly.     Social Drivers of Corporate investment banker Strain: Low Risk  (04/23/2024)   Overall Financial Resource Strain (CARDIA)    Difficulty of Paying Living Expenses: Not hard at all  Food Insecurity: No Food Insecurity (04/23/2024)   Hunger Vital Sign    Worried About Running Out of Food in the Last Year: Never true    Ran Out of Food in the Last Year: Never true  Transportation Needs: No Transportation Needs (04/23/2024)   PRAPARE - Administrator, Civil Service (Medical): No    Lack of Transportation (Non-Medical): No  Physical Activity: Inactive (04/23/2024)   Exercise Vital Sign    Days of Exercise per Week: 0 days    Minutes of Exercise  per Session: 0 min  Stress: No Stress Concern Present (04/23/2024)   [employer] of Occupational Health - Occupational Stress Questionnaire    Feeling of Stress : Not at all  Social Connections: Socially Integrated (04/23/2024)   Social Connection and Isolation Panel [NHANES]    Frequency of Communication with Friends and Family: More than three times a week    Frequency of Social Gatherings with Friends and Family: More than three times a week    Attends Religious Services: More than 4 times per year    Active Member of [employer] or Organizations: Yes    Attends Engineer, structural: More than 4 times per year    Marital Status: Married    Tobacco Counseling Counseling given: Not Answered    Clinical Intake:  Pre-visit preparation completed: Yes  Pain : No/denies pain     BMI - recorded: 26.95 Nutritional Status: BMI 25 -29 Overweight Nutritional Risks: None Diabetes: No  Lab Results  Component Value Date   HGBA1C 5.7 01/15/2022   HGBA1C 5.6 01/12/2021   HGBA1C 5.3 09/11/2018     How often do you need to have someone help you when you read instructions, pamphlets, or other written materials from your doctor or pharmacy?: 1 - Never  Interpreter Needed?: No  Information entered by :: Farris Hong  LPN   Activities of Daily Living      04/23/2024   11:34 AM  In your present state of health, do you have any difficulty performing the following activities:  Hearing? 1  Comment Wears Hearing Aids  Vision? 0  Difficulty concentrating or making decisions? 0  Walking or climbing stairs? 0  Dressing or bathing? 0  Doing errands, shopping? 0  Preparing Food and eating ? N  Using the Toilet? N  In the past six months, have you accidently leaked urine? N  Do you have problems with loss of bowel control? N  Managing your Medications? N  Managing your Finances? N  Housekeeping or managing your Housekeeping? N    Patient Care Team: Marquetta Sit, MD as PCP - General (Family Medicine) Maximo Spar Aviva Lemmings, MD as PCP - Cardiology (Cardiology)  Indicate any recent Medical Services you may have received from other than [facility] providers in the past year (date may be approximate).     Assessment:   This is a routine wellness examination for [place].  Hearing/Vision screen Hearing Screening - Comments:: Wears hearing Aids Vision Screening - Comments::  - up to date with routine eye exams with  [facility]. Dr Brooks Cao   Goals Addressed               This Visit's Progress     Increase physical activity (pt-stated)         Depression Screen      04/23/2024   11:33 AM 04/18/2023   10:56 AM 01/17/2023   12:05 PM 04/15/2022   11:03 AM 01/15/2022   11:38 AM 04/09/2021    3:06 PM 04/09/2021    3:00 PM  PHQ 2/9 Scores  PHQ - 2 Score 0 0 0 0 0 0 0    Fall Risk      04/23/2024   11:35 AM 04/18/2023   10:58 AM 01/17/2023   12:05 PM 09/26/2022    3:03 PM 04/15/2022   11:09 AM  Fall Risk   Falls in the past year? 0 0 0 1 1  Number falls in past yr: 0 0 0 1 0  Injury with Fall? 0 0 0 0 0  Comment     No injury or medical attention needed  Risk for fall due to : No Fall Risks No Fall Risks No Fall Risks  No Fall Risks  Follow up Falls prevention discussed;Falls evaluation completed  Falls prevention discussed Falls evaluation completed      MEDICARE RISK AT HOME:   Medicare Risk at Home Any stairs in or around the home?: No If so, are there any without handrails?: No Home free of loose throw rugs in walkways, pet beds, electrical cords, etc?: Yes Adequate lighting in your home to reduce risk of falls?: Yes Life alert?: No Use of a cane, walker or w/c?: No Grab bars in the bathroom?: Yes Shower chair or bench in shower?: Yes Elevated toilet seat or a handicapped toilet?: Yes  TIMED UP AND GO:  Was the test performed?  No  Cognitive Function: 6CIT completed        04/23/2024   11:35 AM 04/18/2023   10:59 AM 04/15/2022   11:12 AM  6CIT Screen  What Year? 0 points 0 points 0 points  What month? 0 points 0 points 0 points  What time? 0 points 0 points 0 points  Count back from 20 0 points 0 points 0 points  Months in reverse 0 points 0 points 0 points  Repeat phrase 0 points 0 points 0 points  Total Score 0 points 0 points 0 points    Immunizations Immunization History  Administered Date(s) Administered   Fluad Quad(high Dose 65+) 10/11/2022   Hepatitis A, Adult 01/02/2019   Influenza Inj Mdck Quad Pf 09/06/2018   Influenza Split 11/17/2011, 11/20/2012   Influenza, High Dose Seasonal PF 11/15/2014, 12/05/2015, 11/02/2016, 10/22/2017, 09/06/2018   Influenza,inj,Quad PF,6+ Mos 11/21/2013, 10/01/2019   Influenza,inj,quad, With Preservative 09/06/2018   Influenza-Unspecified 09/24/2020, 11/04/2021   Moderna Sars-Covid-2 Vaccination 01/17/2020, 02/14/2020   Pfizer(Comirnaty)Fall Seasonal Vaccine 12 years and older 10/11/2022   Pneumococcal Conjugate-13 11/21/2013   Pneumococcal Polysaccharide-23 01/17/2009   Td 05/20/1997, 01/17/2009   Zoster Recombinant(Shingrix) 04/22/2017, 04/22/2017   Zoster, Live 04/04/2014    Screening Tests Health Maintenance  Topic Date Due   DTaP/Tdap/Td (3 - Tdap) 01/17/2019   COVID-19 Vaccine (4 - 2024-25 season)  08/21/2023   INFLUENZA VACCINE  07/20/2024   Medicare Annual Wellness (AWV)  04/23/2025   Pneumonia Vaccine 1+ Years old  Completed   DEXA SCAN  Completed   HPV VACCINES  Aged Out   Meningococcal B Vaccine  Aged Out   Zoster Vaccines- Shingrix  Discontinued    Health Maintenance  Health Maintenance Due  Topic Date Due   DTaP/Tdap/Td (3 - Tdap) 01/17/2019   COVID-19 Vaccine (4 - 2024-25 season) 08/21/2023   Health Maintenance Items Addressed:   Additional Screening:  Vision Screening: Recommended annual ophthalmology exams for early detection of glaucoma and other disorders of the eye.  Dental Screening: Recommended annual dental exams for proper oral hygiene  Community Resource Referral / Chronic Care Management: CRR required this visit?  No   CCM required this visit?  No     Plan:     I have personally reviewed and noted the following in the patient's chart:   Medical and social history Use of alcohol, tobacco or illicit drugs  Current medications and supplements including opioid prescriptions. Patient is not currently taking opioid prescriptions. Functional ability and status Nutritional status Physical activity Advanced directives List of other physicians Hospitalizations, surgeries, and ER visits in previous  12 months Vitals Screenings to include cognitive, depression, and falls Referrals and appointments  In addition, I have reviewed and discussed with patient certain preventive protocols, quality metrics, and best practice recommendations. A written personalized care plan for preventive services as well as general preventive health recommendations were provided to patient.     Dewayne Ford, LPN   01/27/4131   After Visit Summary: (MyChart) Due to this being a telephonic visit, the after visit summary with patients personalized plan was offered to patient via MyChart   Notes: Nothing significant to report at this time.

## 2024-04-23 NOTE — Addendum Note (Signed)
 Addended by: Dewayne Ford on: 04/23/2024 01:55 PM   Modules accepted: Level of Service

## 2024-04-23 NOTE — Patient Instructions (Addendum)
 [name] , Thank you for taking time to come for your Medicare Wellness Visit. I appreciate your ongoing commitment to your health goals. Please review the following plan we discussed and let me know if I can assist you in the future.   Referrals/Orders/Follow-Ups/Clinician Recommendations:   This is a list of the screening recommended for you and due dates:  Health Maintenance  Topic Date Due   DTaP/Tdap/Td vaccine (3 - Tdap) 01/17/2019   COVID-19 Vaccine (4 - 2024-25 season) 08/21/2023   Flu Shot  07/20/2024   Medicare Annual Wellness Visit  04/23/2025   Pneumonia Vaccine  Completed   DEXA scan (bone density measurement)  Completed   HPV Vaccine  Aged Out   Meningitis B Vaccine  Aged Out   Zoster (Shingles) Vaccine  Discontinued    Advanced directives: (In Chart) A copy of your advanced directives are scanned into your chart should your provider ever need it.  Next Medicare Annual Wellness Visit scheduled for next year: Yes

## 2024-04-24 ENCOUNTER — Ambulatory Visit: Admitting: Emergency Medicine

## 2024-05-01 ENCOUNTER — Telehealth: Payer: Self-pay | Admitting: Internal Medicine

## 2024-05-01 ENCOUNTER — Other Ambulatory Visit: Payer: Self-pay | Admitting: Internal Medicine

## 2024-05-01 NOTE — Telephone Encounter (Signed)
*  STAT* If patient is at the pharmacy, call can be transferred to refill team.   1. Which medications need to be refilled? (please list name of each medication and dose if known)   metoprolol  succinate (TOPROL -XL) 50 MG 24 hr tablet    2. Which pharmacy/location (including street and city if local pharmacy) is medication to be sent to?  [id number] - [place], [place] - [address]      3. Do they need a 30 day or 90 day supply? 90 day   Pt is out of medication

## 2024-05-01 NOTE — Telephone Encounter (Signed)
 Pt's medication was already sent to pt's pharmacy as requested. Called pharmacy and they verbalized that they have received the Rx.

## 2024-05-03 ENCOUNTER — Encounter: Payer: Self-pay | Admitting: Pulmonary Disease

## 2024-05-03 ENCOUNTER — Ambulatory Visit: Admitting: Pulmonary Disease

## 2024-05-03 ENCOUNTER — Other Ambulatory Visit: Payer: Self-pay

## 2024-05-03 ENCOUNTER — Other Ambulatory Visit: Payer: Self-pay | Admitting: Internal Medicine

## 2024-05-03 VITALS — BP 148/79 | HR 82 | Temp 98.5°F | Ht 65.0 in | Wt 173.0 lb

## 2024-05-03 DIAGNOSIS — J302 Other seasonal allergic rhinitis: Secondary | ICD-10-CM

## 2024-05-03 DIAGNOSIS — D869 Sarcoidosis, unspecified: Secondary | ICD-10-CM | POA: Diagnosis not present

## 2024-05-03 DIAGNOSIS — I272 Pulmonary hypertension, unspecified: Secondary | ICD-10-CM | POA: Diagnosis not present

## 2024-05-03 DIAGNOSIS — E785 Hyperlipidemia, unspecified: Secondary | ICD-10-CM

## 2024-05-03 MED ORDER — ATORVASTATIN CALCIUM 40 MG PO TABS: 40.0000 mg | ORAL_TABLET | Freq: Every day | ORAL | 1 refills | Status: DC

## 2024-05-03 NOTE — Telephone Encounter (Signed)
 Spoke with patient and she states she is going to [place] for 6 months. She knows she missed her yearly follow up but she will be out of her medication   She has appointment set up for September when she comes back.

## 2024-05-03 NOTE — Progress Notes (Signed)
 Rx sent.

## 2024-05-03 NOTE — Telephone Encounter (Signed)
 Pt requesting a c/b. Please advise

## 2024-05-03 NOTE — Progress Notes (Signed)
 Mary Summers    [id number]    09/16/1932  Primary Care Physician:Burchette, Mary Shouts, MD  Referring Physician: Marquetta Sit, MD [address] [place],  [place] [address]  Chief complaint: Follow-up for interstitial lung disease, sarcoidosis  HPI: Previously followed by Dr. Lyndal Summers at [place]. Seen by Pulmonary in 2017 with CT scan showing mediastinal adenopathy, calcification, lung nodule.  Initial work-up showing elevated ACE level, rheumatoid factor, SSA.  She is evaluated by Dr. Ebbie Summers, Rheumatology with no clinical evidence of connective tissue disease or Sjogren's  She is had an evaluation at [facility], [place]  May 2019 with repeat high-resolution CT showing upper lobe nodules, calcified lymph nodes, PFTs with no obvious deficit, excellent exercise test.  Swallow eval showed mild to moderate aspiration with recommendations to minimize swallow dysfunction.  CT scanning of the sinuses showed mild sinusitis, rhinitis.  Echocardiogram showed normal LV function, elevated RVSP. Assessment from [place] was that ILD is consistent with sarcoidosis.  No need for treatment at present Follow lung function, chest imaging.  She will need work-up for pulmonary hypertension.  No symptoms today.  She has minimal dyspnea on exertion, minimal cough, nonproductive in nature.  Occasional joint pain, swelling, difficulty swallowing food, dry mouth, dry eyes.  Notes history of Zenker's diverticulum. She continues to maintain an active lifestyle.  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  She spends about half the year in [place], [place]  Pets: No pets, dogs, birds Occupation: Retired Chiropodist Smoking history: Never smoker  Travel history: Travel to [place]. Relevant family history: Significant family history of lung disease.  Comprehensive ILD questionnaire  09/22/2018-negative for environmental exposures, occupational  exposures, medications.  She used to own a feather pillow and comforter several years ago but she got rid of it.  She assisted her first husband with woodworking, furniture making, lacquering from [phone]  Interim history: Discussed the use of AI scribe software for clinical note transcription with the patient, who gave verbal consent to proceed.  History of Present Illness Mary Summers is an 89 year old female with sarcoidosis who presents for follow-up of her condition.  She experiences a persistent cough with yellow phlegm, which she attributes to allergies, particularly in [place] . The cough is more frequent in the morning. She has had episodes of shortness of breath a couple of times in the last two to three weeks, but it is not constant. No constant shortness of breath is reported.  She has a history of sarcoidosis with prior imaging showing mild scarring and calcification in the lymph nodes. She recalls having a CT scan recently, as well as previous scans in 2023 and 2019. Her condition has remained stable over time.  She has not been taking any medication for her cough or allergies. She mentions a cold that lingered from late February until now, causing nasal congestion that clears throughout the day.  She sees an eye doctor regularly and reports that her eyes have been checked for sarcoidosis-related issues.    Outpatient Encounter Medications as of 05/03/2024  Medication Sig   amoxicillin  (AMOXIL ) 500 MG capsule Take 4 tablets 1 hour prior to dental procedure   atorvastatin  (LIPITOR) 40 MG tablet TAKE 1 TABLET BY MOUTH EVERY DAY   beta carotene 30 MG capsule Take 30 mg by mouth daily.   BORON PO Take by mouth.   calcium  gluconate 500 MG tablet Take 1 tablet by  mouth daily.   Calcium -Magnesium-Zinc (CAL-MAG-ZINC PO) Take by mouth.   co-enzyme Q-10 30 MG capsule Take 30 mg by mouth daily.   glucosamine-chondroitin 500-400 MG tablet Take 1 tablet by mouth daily.     metoprolol  succinate (TOPROL -XL) 50 MG 24 hr tablet TAKE 1 TABLET BY MOUTH EVERY DAY   Multiple Vitamin (MULTIVITAMIN) tablet Take 1 tablet by mouth daily.   Omega-3 Fatty Acids (OMEGA 3 PO) Take 1 capsule by mouth daily.   rivaroxaban  (XARELTO ) 20 MG TABS tablet Take 1 tablet (20 mg total) by mouth daily with supper.   SELENIUM PO Take 1 tablet by mouth daily.   Vitamin D, Cholecalciferol, 50 MCG (2000 UT) CAPS Take 2,000 Units by mouth daily.   vitamin E 400 UNIT capsule Take 400 Units by mouth daily.   Ascorbic Acid (VITAMIN C PO) Take 1 tablet by mouth daily. (Patient not taking: Reported on 05/03/2024)   No facility-administered encounter medications on file as of 05/03/2024.   Physical Exam: Blood pressure (!) 148/79, pulse 82, temperature 98.5 F (36.9 C), height 5\' 5"  (1.651 m), weight 173 lb (78.5 kg), SpO2 96%. Gen:      No acute distress HEENT:  EOMI, sclera anicteric Neck:     No masses; no thyromegaly Lungs:    Clear to auscultation bilaterally; normal respiratory effort CV:         Regular rate and rhythm; no murmurs Abd:      + bowel sounds; soft, non-tender; no palpable masses, no distension Ext:    No edema; adequate peripheral perfusion Skin:      Warm and dry; no rash Neuro: alert and oriented x 3 Psych: normal mood and affect   Data Reviewed: Imaging: High-resolution CT 03/02/2016- Hilar lymphadenopathy, mosaic attenuation with air trapping, interlobular septal thickening, upper lobe pulmonary nodules.  High-resolution CT 10/31/2018- stable hilar lymphadenopathy, upper airway septal thickening, lung nodules.  CT chest 03/17/2022-chronic changes in the chest with calcified mediastinal lymph nodes and chronic area of nodularity and architectural distortion in the upper lungs.  High resolution 04/12/2024-stable mediastinal hilar calcification, traction bronchiectasis with nodular consolidation upper lobe predominant.  Unchanged I have reviewed the images personally.    PFTs: 11/02/16: FVC 3.07 L (106%) FEV1 2.51 L (116%) FEV1/FVC 0.82 FEF 25-75 2.74 L (188%), DLCO corrected 69% (Hgb 15.3) 06/25/16: FVC 3.23 L (111%) FEV1 2.16 L (121%) FEV1/FVC 0.81 FEF 25-75 2.80 L (189%), DLCO corrected 62% (hemoglobin 16.2) 04/22/16: FVC 3.10 L (107%) FEV1 2.56 L (118%) FEV1/FVC 0.83 FEF 25-75 2.90 L (194%), DLCO uncorrected 62% 01/05/16: FVC 3.14 L (107%) FEV1 2.60 L (119%) FEV1/FVC 0.83 FEF 25-75 2.95 L (196%) no bronchodilator response TLC 5.87 L (106%) RV 102% ERV 81% DLCO uncorrected 65%  11/02/16:  Walked 432 meters / Baseline Sat 98% on RA / Nadir Sat 98% on RA @ rest 06/25/16:  Walked 357 meters / Baseline Sat 96% on RA / Nadir Sat 96% on RA 04/22/16:  Walked 357 meters / Baseline Sat 96% on RA / Nadir Sat 95% on RA  Labs: 04/22/16 ACE:  72   02/25/16 ANA:  Negative Anti-CCP:  <16 RF:  72 SSA:  6.9 SSB:  <1  Hepatic panel 02/14/2024-within normal limits  Cardiac Echocardiogram 10/31/2018-Normal LV systolic function with grade 2 diastolic dyfunction.   Mild-moderated increased PA systolic pressure. Mild TR.  Data from [facility] [place]  May 2019 CBC-WBC 6.9, eos 2.5%, absolute eosinophil count 200, metabolic panel, LFTs within normal limits. Barium swallow 05/03/2018-  mild to moderate oropharyngeal dysphagia which indicates increased risk for aspiration.  CT chest high-resolution 05/02/2018-partially calcified bilateral hilar and mediastinal adenopathy and upper lung perilymphatic nodularity compatible with sarcoidosis, small hiatal hernia. CT sinus 05/02/2018- minimal ethmoid sinusitis, question right mastoiditis.  Findings suggest rhinitis.  Echocardiogram 05/02/2018-suboptimal image quality, decreased LV cavity size, LVEF 55-60%, RV is moderately enlarged.  Normal RV systolic function and diastolic function.  RVSP is moderately elevated at 47mm, IVC is increased in size suggestive of mild increased right atrial pressure.  Sleep: Sleep study  12/26/2018 AHI 4.3 with low O2 sat of 87%.  Assessment & Plan Sarcoidosis Sarcoidosis is well-managed with mild scarring and calcification in lymph nodes, unchanged since 2019 and 2023.  There is a question of connective tissue disease with positive SSA which is likely nonspecific Annual liver function tests and ophthalmology evaluations remain normal. - No need to repeat CT scan unless new symptoms develop. - Continue annual liver function tests.  Allergic rhinitis Intermittent coughing and yellow sputum production, likely due to allergies, especially given recent weather changes and pollen levels. No persistent dyspnea. Symptoms do not warrant inhaler use at this time. Over-the-counter medications and hydration are recommended to manage symptoms. - Recommend over-the-counter Mucinex for congestion. - Recommend over-the-counter Zyrtec for allergy symptoms. - Advise increased water intake. - Consider inhaler if symptoms become persistent or affect quality of life.   Pulmonary hypertension Repeat echo shows moderate pulmonary hypertension, may be secondary to sarcoid Clinically she is doing well and we will continue to monitor  Plan/Recommendations: - Follow-up in 1 year  Arlynn Breeze MD [facility] 05/03/2024, 11:52 AM  CC: Mary Sit, MD

## 2024-05-03 NOTE — Patient Instructions (Signed)
 VISIT SUMMARY:  Today, we discussed your ongoing management of sarcoidosis and addressed your recent symptoms of coughing and nasal congestion. Your sarcoidosis remains stable with no new changes, and we reviewed your recent imaging and liver function tests. We also talked about managing your allergy symptoms, which seem to be contributing to your cough and congestion.  YOUR PLAN:  -SARCOIDOSIS: Sarcoidosis is a condition where clusters of inflammatory cells form in various organs, often the lungs and lymph nodes. Your condition is stable with mild scarring and calcification in the lymph nodes, unchanged since 2019 and 2023. We will continue with annual liver function tests and will not repeat the CT scan unless new symptoms develop.  -ALLERGIC RHINITIS: Allergic rhinitis is an allergic reaction that causes sneezing, congestion, and a runny nose. Your intermittent coughing and yellow sputum production are likely due to allergies. We recommend using over-the-counter Mucinex for congestion and Zyrtec for allergy symptoms, along with increasing your water intake. If your symptoms become persistent or affect your quality of life, we may consider an inhaler.  INSTRUCTIONS:  Please continue with your annual liver function tests. Use over-the-counter Mucinex and Zyrtec as needed for your congestion and allergy symptoms, and increase your water intake. If you develop new symptoms or if your current symptoms worsen, please contact us .

## 2024-09-14 ENCOUNTER — Encounter: Payer: Self-pay | Admitting: *Deleted

## 2024-09-17 ENCOUNTER — Ambulatory Visit: Attending: Emergency Medicine | Admitting: Emergency Medicine

## 2024-09-17 NOTE — Progress Notes (Deleted)
  Cardiology Office Note:    Date:  09/17/2024  ID:  Analeah, Brame 1932/10/01, MRN [id number] PCP: Micheal Wolm ORN, MD  [facility] Providers Cardiologist:  Vinie JAYSON Maxcy, MD { Click to update primary MD,subspecialty MD or APP then REFRESH:1}    {Click to Open Review  :1}   Patient Profile:       Chief Complaint: *** History of Present Illness:  Mary Summers is a 89 y.o. female with visit-pertinent history of paroxysmal atrial fibrillation on Eliquis , atrial flutter s/p catheter ablation 02/2019, COPD/interstitial lung disease, dyslipidemia, mild bilateral carotid artery disease, sarcoidosis  Echocardiogram 10/2018 showed LVEF 60 to 65%, mild LVH, no RWMA, grade 2 DD, trivial MR.  S/p atrial tach ablation on 02/2019 with successful radiofrequency ablation of atrial flutter along the cavotricuspid isthmus with complete bidirectional isthmus block achieved.   Zio 03/2020 showed average heart rate 96 bpm.  Predominant underlying rhythm was sinus rhythm.  1 run of VT occurred lasting 5 beats with max rate of 112 bpm.  Atrial fibrillation occurred at 6% burden.  She was ultimately started on Eliquis  5 mg twice daily and metoprolol  succinate 25 mg daily.  She was last seen in clinic on 04/20/2023 by Dr. Maxcy.  She did not seem to have any recurrent atrial fibrillation.  She was only taken 50% of the recommended Eliquis  dose.  She was transition from Eliquis  to 20 mg of Xarelto  daily.  Discussed the use of AI scribe software for clinical note transcription with the patient, who gave verbal consent to proceed.  History of Present Illness     Review of systems:  Please see the history of present illness. All other systems are reviewed and otherwise negative. ***      Studies Reviewed:        ***  Risk Assessment/Calculations:   {Does this patient have ATRIAL FIBRILLATION?:[id number]} No BP recorded.  {Refresh Note OR Click here to enter BP  :1}***         Physical Exam:   VS:  There were no vitals taken for this visit.   Wt Readings from Last 3 Encounters:  05/03/24 173 lb (78.5 kg)  04/23/24 167 lb (75.8 kg)  02/14/24 174 lb 8 oz (79.2 kg)    GEN: Well nourished, well developed in no acute distress NECK: No JVD; No carotid bruits CARDIAC: ***RRR, no murmurs, rubs, gallops RESPIRATORY:  Clear to auscultation without rales, wheezing or rhonchi  ABDOMEN: Soft, non-tender, non-distended EXTREMITIES:  No edema; No acute deformity ***      Assessment and Plan:    Assessment and Plan Assessment & Plan      {Are you ordering a CV Procedure (e.g. stress test, cath, DCCV, TEE, etc)?   Press F2        :[id number]}  Dispo:  No follow-ups on file.  Signed, Lum LITTIE Louis, NP

## 2024-09-24 ENCOUNTER — Other Ambulatory Visit: Payer: Self-pay | Admitting: Internal Medicine

## 2024-10-01 ENCOUNTER — Other Ambulatory Visit: Payer: Self-pay | Admitting: Internal Medicine

## 2024-10-01 DIAGNOSIS — E785 Hyperlipidemia, unspecified: Secondary | ICD-10-CM

## 2024-11-02 ENCOUNTER — Ambulatory Visit: Attending: Internal Medicine | Admitting: Internal Medicine

## 2024-11-02 VITALS — BP 146/70 | HR 90 | Ht 66.0 in | Wt 170.0 lb

## 2024-11-02 DIAGNOSIS — Z7901 Long term (current) use of anticoagulants: Secondary | ICD-10-CM

## 2024-11-02 DIAGNOSIS — R030 Elevated blood-pressure reading, without diagnosis of hypertension: Secondary | ICD-10-CM | POA: Diagnosis not present

## 2024-11-02 DIAGNOSIS — E785 Hyperlipidemia, unspecified: Secondary | ICD-10-CM

## 2024-11-02 DIAGNOSIS — I4891 Unspecified atrial fibrillation: Secondary | ICD-10-CM | POA: Diagnosis not present

## 2024-11-02 NOTE — Patient Instructions (Addendum)
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
## 2025-01-03 ENCOUNTER — Other Ambulatory Visit: Payer: Self-pay | Admitting: Internal Medicine

## 2025-01-14 ENCOUNTER — Other Ambulatory Visit: Payer: Self-pay | Admitting: Internal Medicine

## 2025-02-19 ENCOUNTER — Encounter: Admitting: Family Medicine

## 2025-04-29 ENCOUNTER — Ambulatory Visit

## 2025-05-01 ENCOUNTER — Ambulatory Visit
